# Patient Record
Sex: Female | Born: 1945 | Race: White | Hispanic: No | Marital: Single | Smoking: Former smoker
Health system: Southern US, Community
[De-identification: ages and names within clinical notes are randomized; demographics above are authoritative.]

## PROBLEM LIST (undated history)

## (undated) DIAGNOSIS — I2699 Other pulmonary embolism without acute cor pulmonale: Secondary | ICD-10-CM

## (undated) DIAGNOSIS — F32A Depression, unspecified: Secondary | ICD-10-CM

## (undated) DIAGNOSIS — M1990 Unspecified osteoarthritis, unspecified site: Secondary | ICD-10-CM

## (undated) DIAGNOSIS — F329 Major depressive disorder, single episode, unspecified: Secondary | ICD-10-CM

## (undated) DIAGNOSIS — I10 Essential (primary) hypertension: Secondary | ICD-10-CM

## (undated) HISTORY — PX: CATARACT EXTRACTION: SUR2

## (undated) HISTORY — PX: COLONOSCOPY: SHX174

## (undated) HISTORY — PX: POLYPECTOMY: SHX149

---

## 1981-07-18 HISTORY — PX: BACK SURGERY: SHX140

## 1998-12-17 ENCOUNTER — Ambulatory Visit (HOSPITAL_COMMUNITY): Admission: RE | Admit: 1998-12-17 | Discharge: 1998-12-17 | Payer: Self-pay | Admitting: Family Medicine

## 1998-12-17 ENCOUNTER — Encounter: Payer: Self-pay | Admitting: Family Medicine

## 2001-10-11 ENCOUNTER — Other Ambulatory Visit: Admission: RE | Admit: 2001-10-11 | Discharge: 2001-10-11 | Payer: Self-pay | Admitting: Obstetrics and Gynecology

## 2003-01-27 ENCOUNTER — Other Ambulatory Visit: Admission: RE | Admit: 2003-01-27 | Discharge: 2003-01-27 | Payer: Self-pay | Admitting: Obstetrics and Gynecology

## 2004-02-09 ENCOUNTER — Other Ambulatory Visit: Admission: RE | Admit: 2004-02-09 | Discharge: 2004-02-09 | Payer: Self-pay | Admitting: Obstetrics and Gynecology

## 2005-03-28 ENCOUNTER — Other Ambulatory Visit: Admission: RE | Admit: 2005-03-28 | Discharge: 2005-03-28 | Payer: Self-pay | Admitting: Obstetrics and Gynecology

## 2006-05-22 ENCOUNTER — Ambulatory Visit: Payer: Self-pay | Admitting: Internal Medicine

## 2006-06-12 ENCOUNTER — Ambulatory Visit: Payer: Self-pay | Admitting: Internal Medicine

## 2011-05-09 ENCOUNTER — Other Ambulatory Visit: Payer: Self-pay | Admitting: Obstetrics and Gynecology

## 2011-05-26 ENCOUNTER — Encounter: Payer: Self-pay | Admitting: Internal Medicine

## 2012-04-16 ENCOUNTER — Encounter: Payer: Self-pay | Admitting: Internal Medicine

## 2012-05-16 ENCOUNTER — Other Ambulatory Visit: Payer: Self-pay | Admitting: Obstetrics and Gynecology

## 2013-01-22 DIAGNOSIS — H40019 Open angle with borderline findings, low risk, unspecified eye: Secondary | ICD-10-CM | POA: Diagnosis not present

## 2013-01-22 DIAGNOSIS — H35379 Puckering of macula, unspecified eye: Secondary | ICD-10-CM | POA: Diagnosis not present

## 2013-03-20 DIAGNOSIS — M1710 Unilateral primary osteoarthritis, unspecified knee: Secondary | ICD-10-CM | POA: Diagnosis not present

## 2013-03-27 DIAGNOSIS — M1710 Unilateral primary osteoarthritis, unspecified knee: Secondary | ICD-10-CM | POA: Diagnosis not present

## 2013-04-10 DIAGNOSIS — M1710 Unilateral primary osteoarthritis, unspecified knee: Secondary | ICD-10-CM | POA: Diagnosis not present

## 2013-04-16 DIAGNOSIS — Z23 Encounter for immunization: Secondary | ICD-10-CM | POA: Diagnosis not present

## 2013-04-16 DIAGNOSIS — E785 Hyperlipidemia, unspecified: Secondary | ICD-10-CM | POA: Diagnosis not present

## 2013-04-16 DIAGNOSIS — F329 Major depressive disorder, single episode, unspecified: Secondary | ICD-10-CM | POA: Diagnosis not present

## 2013-04-16 DIAGNOSIS — I10 Essential (primary) hypertension: Secondary | ICD-10-CM | POA: Diagnosis not present

## 2013-04-16 DIAGNOSIS — E559 Vitamin D deficiency, unspecified: Secondary | ICD-10-CM | POA: Diagnosis not present

## 2013-04-17 DIAGNOSIS — M1710 Unilateral primary osteoarthritis, unspecified knee: Secondary | ICD-10-CM | POA: Diagnosis not present

## 2013-04-24 DIAGNOSIS — M1710 Unilateral primary osteoarthritis, unspecified knee: Secondary | ICD-10-CM | POA: Diagnosis not present

## 2013-05-29 DIAGNOSIS — M25569 Pain in unspecified knee: Secondary | ICD-10-CM | POA: Diagnosis not present

## 2013-05-29 DIAGNOSIS — M545 Low back pain: Secondary | ICD-10-CM | POA: Diagnosis not present

## 2013-06-26 DIAGNOSIS — M25569 Pain in unspecified knee: Secondary | ICD-10-CM | POA: Diagnosis not present

## 2013-08-06 ENCOUNTER — Other Ambulatory Visit: Payer: Self-pay | Admitting: Obstetrics and Gynecology

## 2013-08-06 DIAGNOSIS — Z1231 Encounter for screening mammogram for malignant neoplasm of breast: Secondary | ICD-10-CM | POA: Diagnosis not present

## 2013-08-06 DIAGNOSIS — Z01419 Encounter for gynecological examination (general) (routine) without abnormal findings: Secondary | ICD-10-CM | POA: Diagnosis not present

## 2013-08-06 DIAGNOSIS — Z124 Encounter for screening for malignant neoplasm of cervix: Secondary | ICD-10-CM | POA: Diagnosis not present

## 2013-10-07 DIAGNOSIS — Z0000 Encounter for general adult medical examination without abnormal findings: Secondary | ICD-10-CM | POA: Diagnosis not present

## 2013-10-07 DIAGNOSIS — N183 Chronic kidney disease, stage 3 unspecified: Secondary | ICD-10-CM | POA: Diagnosis not present

## 2013-10-07 DIAGNOSIS — E559 Vitamin D deficiency, unspecified: Secondary | ICD-10-CM | POA: Diagnosis not present

## 2013-10-07 DIAGNOSIS — I129 Hypertensive chronic kidney disease with stage 1 through stage 4 chronic kidney disease, or unspecified chronic kidney disease: Secondary | ICD-10-CM | POA: Diagnosis not present

## 2013-10-07 DIAGNOSIS — D369 Benign neoplasm, unspecified site: Secondary | ICD-10-CM | POA: Diagnosis not present

## 2013-10-07 DIAGNOSIS — N951 Menopausal and female climacteric states: Secondary | ICD-10-CM | POA: Diagnosis not present

## 2013-10-07 DIAGNOSIS — Z1211 Encounter for screening for malignant neoplasm of colon: Secondary | ICD-10-CM | POA: Diagnosis not present

## 2013-10-07 DIAGNOSIS — E785 Hyperlipidemia, unspecified: Secondary | ICD-10-CM | POA: Diagnosis not present

## 2013-10-28 DIAGNOSIS — M25569 Pain in unspecified knee: Secondary | ICD-10-CM | POA: Diagnosis not present

## 2013-11-14 ENCOUNTER — Other Ambulatory Visit: Payer: Self-pay | Admitting: Orthopaedic Surgery

## 2013-11-14 ENCOUNTER — Encounter (HOSPITAL_COMMUNITY): Payer: Self-pay | Admitting: Pharmacy Technician

## 2013-11-19 ENCOUNTER — Encounter (HOSPITAL_COMMUNITY)
Admission: RE | Admit: 2013-11-19 | Discharge: 2013-11-19 | Disposition: A | Discharge status: Home / Self Care | Payer: Medicare Other | Source: Ambulatory Visit | Attending: Orthopaedic Surgery | Admitting: Orthopaedic Surgery

## 2013-11-19 ENCOUNTER — Encounter (HOSPITAL_COMMUNITY): Payer: Self-pay

## 2013-11-19 ENCOUNTER — Other Ambulatory Visit (HOSPITAL_COMMUNITY): Payer: Self-pay | Admitting: *Deleted

## 2013-11-19 DIAGNOSIS — Z01810 Encounter for preprocedural cardiovascular examination: Secondary | ICD-10-CM | POA: Diagnosis not present

## 2013-11-19 DIAGNOSIS — Z01812 Encounter for preprocedural laboratory examination: Secondary | ICD-10-CM | POA: Diagnosis not present

## 2013-11-19 DIAGNOSIS — Z01818 Encounter for other preprocedural examination: Secondary | ICD-10-CM | POA: Insufficient documentation

## 2013-11-19 HISTORY — DX: Unspecified osteoarthritis, unspecified site: M19.90

## 2013-11-19 HISTORY — DX: Depression, unspecified: F32.A

## 2013-11-19 HISTORY — DX: Other pulmonary embolism without acute cor pulmonale: I26.99

## 2013-11-19 HISTORY — DX: Essential (primary) hypertension: I10

## 2013-11-19 HISTORY — DX: Major depressive disorder, single episode, unspecified: F32.9

## 2013-11-19 LAB — CBC WITH DIFFERENTIAL/PLATELET
Basophils Absolute: 0 10*3/uL (ref 0.0–0.1)
Basophils Relative: 0 % (ref 0–1)
EOS ABS: 0.1 10*3/uL (ref 0.0–0.7)
Eosinophils Relative: 3 % (ref 0–5)
HEMATOCRIT: 43.5 % (ref 36.0–46.0)
HEMOGLOBIN: 14.3 g/dL (ref 12.0–15.0)
LYMPHS ABS: 1.7 10*3/uL (ref 0.7–4.0)
Lymphocytes Relative: 33 % (ref 12–46)
MCH: 32 pg (ref 26.0–34.0)
MCHC: 32.9 g/dL (ref 30.0–36.0)
MCV: 97.3 fL (ref 78.0–100.0)
MONOS PCT: 9 % (ref 3–12)
Monocytes Absolute: 0.5 10*3/uL (ref 0.1–1.0)
NEUTROS ABS: 2.9 10*3/uL (ref 1.7–7.7)
Neutrophils Relative %: 55 % (ref 43–77)
Platelets: 212 10*3/uL (ref 150–400)
RBC: 4.47 MIL/uL (ref 3.87–5.11)
RDW: 13.3 % (ref 11.5–15.5)
WBC: 5.3 10*3/uL (ref 4.0–10.5)

## 2013-11-19 LAB — BASIC METABOLIC PANEL
BUN: 21 mg/dL (ref 6–23)
CHLORIDE: 103 meq/L (ref 96–112)
CO2: 26 mEq/L (ref 19–32)
Calcium: 9.3 mg/dL (ref 8.4–10.5)
Creatinine, Ser: 1 mg/dL (ref 0.50–1.10)
GFR calc Af Amer: 67 mL/min — ABNORMAL LOW (ref >90)
GFR calc non Af Amer: 58 mL/min — ABNORMAL LOW (ref >90)
GLUCOSE: 92 mg/dL (ref 70–99)
POTASSIUM: 4.5 meq/L (ref 3.7–5.3)
Sodium: 142 mEq/L (ref 137–147)

## 2013-11-19 LAB — URINE MICROSCOPIC-ADD ON

## 2013-11-19 LAB — URINALYSIS, ROUTINE W REFLEX MICROSCOPIC
Bilirubin Urine: NEGATIVE
Glucose, UA: NEGATIVE mg/dL
Hgb urine dipstick: NEGATIVE
KETONES UR: NEGATIVE mg/dL
NITRITE: NEGATIVE
Protein, ur: NEGATIVE mg/dL
Specific Gravity, Urine: 1.019 (ref 1.005–1.030)
Urobilinogen, UA: 0.2 mg/dL (ref 0.0–1.0)
pH: 7 (ref 5.0–8.0)

## 2013-11-19 LAB — PROTIME-INR
INR: 0.98 (ref 0.00–1.49)
Prothrombin Time: 12.8 seconds (ref 11.6–15.2)

## 2013-11-19 LAB — SURGICAL PCR SCREEN
MRSA, PCR: NEGATIVE
Staphylococcus aureus: NEGATIVE

## 2013-11-19 LAB — TYPE AND SCREEN
ABO/RH(D): A NEG
Antibody Screen: NEGATIVE

## 2013-11-19 LAB — APTT: aPTT: 25 seconds (ref 24–37)

## 2013-11-19 LAB — ABO/RH: ABO/RH(D): A NEG

## 2013-11-19 NOTE — Pre-Procedure Instructions (Addendum)
Gina Petty  11/19/2013   Your procedure is scheduled on:  Tuesday, Nov 26, 2013 at 10:20 AM.   Report to [facility] Entrance "A" Admitting Office at 8:20 AM.   Call this number if you have problems the morning of surgery: [phone]   Remember:   Do not eat food or drink liquids after midnight Monday, 11/25/13.   Take these medicines the morning of surgery with A SIP OF WATER: buPROPion (WELLBUTRIN XL), tylenol - if needed.  Stop Vitamins as of today. Please take all your other medicines as directed until day of surgery and follow above instructions.     Do not wear jewelry, make-up or nail polish.  Do not wear lotions, powders, or perfumes. You may wear deodorant.  Do not shave 48 hours prior to surgery.   Do not bring valuables to the hospital.  [facility] is not responsible                  for any belongings or valuables.               Contacts, dentures or bridgework may not be worn into surgery.  Leave suitcase in the car. After surgery it may be brought to your room.  For patients admitted to the hospital, discharge time is determined by your                treatment team.            Special Instructions: [facility] - Preparing for Surgery  Before surgery, you can play an important role.  Because skin is not sterile, your skin needs to be as free of germs as possible.  You can reduce the number of germs on you skin by washing with CHG (chlorahexidine gluconate) soap before surgery.  CHG is an antiseptic cleaner which kills germs and bonds with the skin to continue killing germs even after washing.  Please DO NOT use if you have an allergy to CHG or antibacterial soaps.  If your skin becomes reddened/irritated stop using the CHG and inform your nurse when you arrive at Short Stay.  Do not shave (including legs and underarms) for at least 48 hours prior to the first CHG shower.  You may shave your face.  Please follow these instructions carefully:   1.  Shower  with CHG Soap the night before surgery and the                                morning of Surgery.  2.  If you choose to wash your hair, wash your hair first as usual with your       normal shampoo.  3.  After you shampoo, rinse your hair and body thoroughly to remove the                      Shampoo.  4.  Use CHG as you would any other liquid soap.  You can apply chg directly       to the skin and wash gently with scrungie or a clean washcloth.  5.  Apply the CHG Soap to your body ONLY FROM THE NECK DOWN.        Do not use on open wounds or open sores.  Avoid contact with your eyes, ears, mouth and genitals (private parts).  Wash genitals (private parts) with your normal soap.  6.  Wash thoroughly, paying special attention to the area where your surgery        will be performed.  7.  Thoroughly rinse your body with warm water from the neck down.  8.  DO NOT shower/wash with your normal soap after using and rinsing off       the CHG Soap.  9.  Pat yourself dry with a clean towel.            10.  Wear clean pajamas.            11.  Place clean sheets on your bed the night of your first shower and do not        sleep with pets.  Day of Surgery  Do not apply any lotions the morning of surgery.  Please wear clean clothes to the hospital/surgery center.     Please read over the following fact sheets that you were given: Pain Booklet, Coughing and Deep Breathing, Blood Transfusion Information, MRSA Information and Surgical Site Infection Prevention

## 2013-11-22 NOTE — H&P (Signed)
TOTAL KNEE ADMISSION H&P  Patient is being admitted for right total knee arthroplasty.  Subjective:  Chief Complaint:right knee pain.  HPI: Gina Petty, 68 y.o. female, has a history of pain and functional disability in the right knee due to arthritis and has failed non-surgical conservative treatments for greater than 12 weeks to includeNSAID's and/or analgesics, corticosteriod injections, viscosupplementation injections, flexibility and strengthening excercises and activity modification.  Onset of symptoms was gradual, starting 6 years ago with stable course since that time. The patient noted no past surgery on the left knee(s).  Patient currently rates pain in the left knee(s) at 9 out of 10 with activity. Patient has night pain, worsening of pain with activity and weight bearing, pain that interferes with activities of daily living, pain with passive range of motion, crepitus and joint swelling.  Patient has evidence of subchondral sclerosis, periarticular osteophytes, joint subluxation and joint space narrowing by imaging studies. This patient has had none. There is no active infection.  There are no active problems to display for this patient.  Past Medical History  Diagnosis Date  . Hypertension   . PE (pulmonary embolism)     posssible 74  not able to prove  . Depression   . Arthritis     Past Surgical History  Procedure Laterality Date  . Back surgery  85    No prescriptions prior to admission   Allergies  Allergen Reactions  . Penicillins Rash    History  Substance Use Topics  . Smoking status: Former Smoker -- 1.00 packs/day for 25 years    Types: Cigarettes    Quit date: 11/19/2000  . Smokeless tobacco: Not on file  . Alcohol Use: 7.2 oz/week    8 Glasses of wine, 4 Cans of beer per week    No family history on file.   Review of Systems  Constitutional: Negative.   HENT: Negative.   Eyes: Negative.   Respiratory: Negative.   Cardiovascular: Negative.    Gastrointestinal: Negative.   Genitourinary: Negative.   Musculoskeletal: Positive for joint pain.  Skin: Negative.   Neurological: Negative.   Endo/Heme/Allergies: Negative.   Psychiatric/Behavioral: Negative.     Objective:  Physical Exam  Constitutional: She appears well-developed.  HENT:  Head: Normocephalic.  Eyes: Pupils are equal, round, and reactive to light.  Neck: Normal range of motion.  Cardiovascular: Normal rate.   Respiratory: Effort normal.  GI: Soft.  Musculoskeletal:  Right knee exam: Range of motion is 0-100.  Significant pain medial joint line with trace effusion and 1+ crepitation.  Neurological: She is alert.  Skin: Skin is warm.    Vital signs in last 24 hours:    Labs:   There is no height or weight on file to calculate BMI.   Imaging Review Plain radiographs demonstrate severe degenerative joint disease of the right knee(s). The overall alignment isneutral. The bone quality appears to be good for age and reported activity level.  Assessment/Plan:  End stage arthritis, right knee   The patient history, physical examination, clinical judgment of the provider and imaging studies are consistent with end stage degenerative joint disease of the right knee(s) and total knee arthroplasty is deemed medically necessary. The treatment options including medical management, injection therapy arthroscopy and arthroplasty were discussed at length. The risks and benefits of total knee arthroplasty were presented and reviewed. The risks due to aseptic loosening, infection, stiffness, patella tracking problems, thromboembolic complications and other imponderables were discussed. The patient acknowledged the explanation,  agreed to proceed with the plan and consent was signed. Patient is being admitted for inpatient treatment for surgery, pain control, PT, OT, prophylactic antibiotics, VTE prophylaxis, progressive ambulation and ADL's and discharge planning. The  patient is planning to be discharged home with home health services

## 2013-11-25 MED ORDER — LACTATED RINGERS IV SOLN
INTRAVENOUS | Status: DC
  Administered 2013-11-26: 50 mL/h via INTRAVENOUS

## 2013-11-25 MED ORDER — CEFAZOLIN SODIUM-DEXTROSE 2-3 GM-% IV SOLR: 2.0000 g | INTRAVENOUS | Status: DC

## 2013-11-25 MED ORDER — CHLORHEXIDINE GLUCONATE 4 % EX LIQD
60.0000 mL | Freq: Once | CUTANEOUS | Status: DC
  Filled 2013-11-25: qty 60

## 2013-11-26 ENCOUNTER — Encounter (HOSPITAL_COMMUNITY): Payer: Self-pay | Admitting: Anesthesiology

## 2013-11-26 ENCOUNTER — Encounter (HOSPITAL_COMMUNITY): Payer: Medicare Other | Admitting: Anesthesiology

## 2013-11-26 ENCOUNTER — Inpatient Hospital Stay (HOSPITAL_COMMUNITY)
Admission: RE | Admit: 2013-11-26 | Discharge: 2013-11-29 | DRG: 470 | Disposition: A | Discharge status: Skilled Nursing Facility | Payer: Medicare Other | Source: Ambulatory Visit | Attending: Orthopaedic Surgery | Admitting: Orthopaedic Surgery

## 2013-11-26 ENCOUNTER — Inpatient Hospital Stay (HOSPITAL_COMMUNITY): Payer: Medicare Other | Admitting: Anesthesiology

## 2013-11-26 ENCOUNTER — Encounter (HOSPITAL_COMMUNITY)
Admission: RE | Disposition: A | Discharge status: Skilled Nursing Facility | Payer: Self-pay | Source: Ambulatory Visit | Attending: Orthopaedic Surgery

## 2013-11-26 DIAGNOSIS — M25569 Pain in unspecified knee: Secondary | ICD-10-CM | POA: Diagnosis not present

## 2013-11-26 DIAGNOSIS — Z880 Allergy status to penicillin: Secondary | ICD-10-CM | POA: Diagnosis not present

## 2013-11-26 DIAGNOSIS — Z79899 Other long term (current) drug therapy: Secondary | ICD-10-CM

## 2013-11-26 DIAGNOSIS — F3289 Other specified depressive episodes: Secondary | ICD-10-CM | POA: Diagnosis not present

## 2013-11-26 DIAGNOSIS — M1711 Unilateral primary osteoarthritis, right knee: Secondary | ICD-10-CM | POA: Diagnosis present

## 2013-11-26 DIAGNOSIS — I10 Essential (primary) hypertension: Secondary | ICD-10-CM | POA: Diagnosis present

## 2013-11-26 DIAGNOSIS — F329 Major depressive disorder, single episode, unspecified: Secondary | ICD-10-CM | POA: Diagnosis present

## 2013-11-26 DIAGNOSIS — Z86711 Personal history of pulmonary embolism: Secondary | ICD-10-CM

## 2013-11-26 DIAGNOSIS — S8990XA Unspecified injury of unspecified lower leg, initial encounter: Secondary | ICD-10-CM | POA: Diagnosis not present

## 2013-11-26 DIAGNOSIS — M1710 Unilateral primary osteoarthritis, unspecified knee: Principal | ICD-10-CM | POA: Diagnosis present

## 2013-11-26 DIAGNOSIS — M6281 Muscle weakness (generalized): Secondary | ICD-10-CM | POA: Diagnosis not present

## 2013-11-26 DIAGNOSIS — IMO0002 Reserved for concepts with insufficient information to code with codable children: Secondary | ICD-10-CM | POA: Diagnosis not present

## 2013-11-26 DIAGNOSIS — Z7982 Long term (current) use of aspirin: Secondary | ICD-10-CM

## 2013-11-26 DIAGNOSIS — G8918 Other acute postprocedural pain: Secondary | ICD-10-CM | POA: Diagnosis not present

## 2013-11-26 DIAGNOSIS — Z96659 Presence of unspecified artificial knee joint: Secondary | ICD-10-CM | POA: Diagnosis not present

## 2013-11-26 DIAGNOSIS — E785 Hyperlipidemia, unspecified: Secondary | ICD-10-CM | POA: Diagnosis not present

## 2013-11-26 DIAGNOSIS — R269 Unspecified abnormalities of gait and mobility: Secondary | ICD-10-CM | POA: Diagnosis not present

## 2013-11-26 DIAGNOSIS — R279 Unspecified lack of coordination: Secondary | ICD-10-CM | POA: Diagnosis not present

## 2013-11-26 DIAGNOSIS — Z471 Aftercare following joint replacement surgery: Secondary | ICD-10-CM | POA: Diagnosis not present

## 2013-11-26 DIAGNOSIS — Z87891 Personal history of nicotine dependence: Secondary | ICD-10-CM | POA: Diagnosis not present

## 2013-11-26 HISTORY — PX: TOTAL KNEE ARTHROPLASTY: SHX125

## 2013-11-26 SURGERY — ARTHROPLASTY, KNEE, TOTAL
Anesthesia: General | Site: Knee | Laterality: Right

## 2013-11-26 MED ORDER — BUPROPION HCL ER (XL) 150 MG PO TB24
150.0000 mg | ORAL_TABLET | Freq: Every day | ORAL | Status: DC
  Administered 2013-11-27 – 2013-11-29 (×3): 150 mg via ORAL
  Filled 2013-11-26 (×4): qty 1

## 2013-11-26 MED ORDER — GLYCOPYRROLATE 0.2 MG/ML IJ SOLN
INTRAMUSCULAR | Status: DC | PRN
  Administered 2013-11-26: 0.4 mg via INTRAVENOUS

## 2013-11-26 MED ORDER — ONDANSETRON HCL 4 MG PO TABS: 4.0000 mg | ORAL_TABLET | Freq: Four times a day (QID) | ORAL | Status: DC | PRN

## 2013-11-26 MED ORDER — LIDOCAINE HCL (CARDIAC) 20 MG/ML IV SOLN
INTRAVENOUS | Status: DC | PRN
  Administered 2013-11-26: 50 mg via INTRAVENOUS

## 2013-11-26 MED ORDER — NEOSTIGMINE METHYLSULFATE 10 MG/10ML IV SOLN
INTRAVENOUS | Status: DC | PRN
  Administered 2013-11-26: 3 mg via INTRAVENOUS

## 2013-11-26 MED ORDER — VITAMIN D3 25 MCG (1000 UNIT) PO TABS
1000.0000 [IU] | ORAL_TABLET | Freq: Every day | ORAL | Status: DC
  Administered 2013-11-26 – 2013-11-29 (×4): 1000 [IU] via ORAL
  Filled 2013-11-26 (×4): qty 1

## 2013-11-26 MED ORDER — CEFAZOLIN SODIUM-DEXTROSE 2-3 GM-% IV SOLR
2.0000 g | Freq: Four times a day (QID) | INTRAVENOUS | Status: AC
  Administered 2013-11-26 (×2): 2 g via INTRAVENOUS
  Filled 2013-11-26 (×4): qty 50

## 2013-11-26 MED ORDER — DIPHENHYDRAMINE HCL 12.5 MG/5ML PO ELIX
12.5000 mg | ORAL_SOLUTION | ORAL | Status: DC | PRN
  Administered 2013-11-27 (×2): 25 mg via ORAL
  Filled 2013-11-26 (×2): qty 10

## 2013-11-26 MED ORDER — ONDANSETRON HCL 4 MG/2ML IJ SOLN
INTRAMUSCULAR | Status: DC | PRN
  Administered 2013-11-26: 4 mg via INTRAVENOUS

## 2013-11-26 MED ORDER — ONDANSETRON HCL 4 MG/2ML IJ SOLN: 4.0000 mg | Freq: Once | INTRAMUSCULAR | Status: DC | PRN

## 2013-11-26 MED ORDER — NEOSTIGMINE METHYLSULFATE 10 MG/10ML IV SOLN
INTRAVENOUS | Status: AC
  Filled 2013-11-26: qty 1

## 2013-11-26 MED ORDER — ASPIRIN EC 325 MG PO TBEC
325.0000 mg | DELAYED_RELEASE_TABLET | Freq: Two times a day (BID) | ORAL | Status: DC
  Administered 2013-11-27 – 2013-11-29 (×5): 325 mg via ORAL
  Filled 2013-11-26 (×7): qty 1

## 2013-11-26 MED ORDER — PROPOFOL 10 MG/ML IV BOLUS
INTRAVENOUS | Status: DC | PRN
  Administered 2013-11-26: 170 mg via INTRAVENOUS

## 2013-11-26 MED ORDER — MIDAZOLAM HCL 2 MG/2ML IJ SOLN
INTRAMUSCULAR | Status: AC
  Administered 2013-11-26: 2 mg
  Filled 2013-11-26: qty 2

## 2013-11-26 MED ORDER — HYDROMORPHONE HCL PF 1 MG/ML IJ SOLN
INTRAMUSCULAR | Status: AC
  Filled 2013-11-26: qty 1

## 2013-11-26 MED ORDER — METHOCARBAMOL 500 MG PO TABS
500.0000 mg | ORAL_TABLET | Freq: Four times a day (QID) | ORAL | Status: DC | PRN
  Administered 2013-11-26 – 2013-11-28 (×5): 500 mg via ORAL
  Filled 2013-11-26 (×6): qty 1

## 2013-11-26 MED ORDER — FENTANYL CITRATE 0.05 MG/ML IJ SOLN
INTRAMUSCULAR | Status: AC
  Filled 2013-11-26: qty 5

## 2013-11-26 MED ORDER — ALUM & MAG HYDROXIDE-SIMETH 200-200-20 MG/5ML PO SUSP: 30.0000 mL | ORAL | Status: DC | PRN

## 2013-11-26 MED ORDER — TRIAMTERENE-HCTZ 37.5-25 MG PO TABS
1.0000 | ORAL_TABLET | Freq: Every day | ORAL | Status: DC
  Administered 2013-11-26 – 2013-11-29 (×4): 1 via ORAL
  Filled 2013-11-26 (×4): qty 1

## 2013-11-26 MED ORDER — SIMVASTATIN 20 MG PO TABS
20.0000 mg | ORAL_TABLET | Freq: Every day | ORAL | Status: DC
  Administered 2013-11-27 – 2013-11-28 (×2): 20 mg via ORAL
  Filled 2013-11-26 (×3): qty 1

## 2013-11-26 MED ORDER — CEFAZOLIN SODIUM-DEXTROSE 2-3 GM-% IV SOLR
INTRAVENOUS | Status: AC
  Administered 2013-11-26: 2 g via INTRAVENOUS
  Filled 2013-11-26: qty 50

## 2013-11-26 MED ORDER — OXYCODONE HCL 5 MG PO TABS
ORAL_TABLET | ORAL | Status: AC
  Filled 2013-11-26: qty 1

## 2013-11-26 MED ORDER — FENTANYL CITRATE 0.05 MG/ML IJ SOLN
INTRAMUSCULAR | Status: DC | PRN
  Administered 2013-11-26: 150 ug via INTRAVENOUS
  Administered 2013-11-26: 100 ug via INTRAVENOUS

## 2013-11-26 MED ORDER — ACETAMINOPHEN 325 MG PO TABS: 650.0000 mg | ORAL_TABLET | Freq: Four times a day (QID) | ORAL | Status: DC | PRN

## 2013-11-26 MED ORDER — ONE-DAILY MULTI VITAMINS PO TABS
1.0000 | ORAL_TABLET | Freq: Every day | ORAL | Status: DC
  Administered 2013-11-26 – 2013-11-29 (×4): 1 via ORAL
  Filled 2013-11-26 (×4): qty 1

## 2013-11-26 MED ORDER — PHENYLEPHRINE HCL 10 MG/ML IJ SOLN
INTRAMUSCULAR | Status: DC | PRN
  Administered 2013-11-26 (×2): 80 ug via INTRAVENOUS

## 2013-11-26 MED ORDER — LACTATED RINGERS IV SOLN
INTRAVENOUS | Status: DC | PRN
  Administered 2013-11-26 (×2): via INTRAVENOUS

## 2013-11-26 MED ORDER — HYDROMORPHONE HCL PF 1 MG/ML IJ SOLN
0.5000 mg | INTRAMUSCULAR | Status: DC | PRN
  Administered 2013-11-26 – 2013-11-28 (×6): 1 mg via INTRAVENOUS
  Filled 2013-11-26 (×6): qty 1

## 2013-11-26 MED ORDER — FENTANYL CITRATE 0.05 MG/ML IJ SOLN
INTRAMUSCULAR | Status: AC
  Administered 2013-11-26: 100 ug
  Filled 2013-11-26: qty 2

## 2013-11-26 MED ORDER — SODIUM CHLORIDE 0.9 % IR SOLN
Status: DC | PRN
  Administered 2013-11-26: 3000 mL

## 2013-11-26 MED ORDER — OXYCODONE HCL 5 MG PO TABS
5.0000 mg | ORAL_TABLET | Freq: Once | ORAL | Status: AC | PRN
  Administered 2013-11-26: 5 mg via ORAL

## 2013-11-26 MED ORDER — ROCURONIUM BROMIDE 50 MG/5ML IV SOLN
INTRAVENOUS | Status: AC
  Filled 2013-11-26: qty 1

## 2013-11-26 MED ORDER — METHOCARBAMOL 1000 MG/10ML IJ SOLN
500.0000 mg | Freq: Four times a day (QID) | INTRAVENOUS | Status: DC | PRN
  Administered 2013-11-26: 500 mg via INTRAVENOUS
  Filled 2013-11-26: qty 5

## 2013-11-26 MED ORDER — GLYCOPYRROLATE 0.2 MG/ML IJ SOLN
INTRAMUSCULAR | Status: AC
  Filled 2013-11-26: qty 2

## 2013-11-26 MED ORDER — PHENOL 1.4 % MT LIQD: 1.0000 | OROMUCOSAL | Status: DC | PRN

## 2013-11-26 MED ORDER — TRANEXAMIC ACID 100 MG/ML IV SOLN
1000.0000 mg | INTRAVENOUS | Status: AC
  Administered 2013-11-26: 1000 mg via INTRAVENOUS
  Filled 2013-11-26: qty 10

## 2013-11-26 MED ORDER — ONDANSETRON HCL 4 MG/2ML IJ SOLN
INTRAMUSCULAR | Status: AC
  Filled 2013-11-26: qty 2

## 2013-11-26 MED ORDER — OXYCODONE HCL 5 MG/5ML PO SOLN: 5.0000 mg | Freq: Once | ORAL | Status: AC | PRN

## 2013-11-26 MED ORDER — LACTATED RINGERS IV SOLN
INTRAVENOUS | Status: DC
  Administered 2013-11-27: 02:00:00 via INTRAVENOUS

## 2013-11-26 MED ORDER — HYDROCODONE-ACETAMINOPHEN 10-325 MG PO TABS
1.0000 | ORAL_TABLET | ORAL | Status: DC | PRN
  Administered 2013-11-26 – 2013-11-29 (×12): 2 via ORAL
  Filled 2013-11-26 (×12): qty 2

## 2013-11-26 MED ORDER — DEXAMETHASONE SODIUM PHOSPHATE 4 MG/ML IJ SOLN
INTRAMUSCULAR | Status: DC | PRN
  Administered 2013-11-26: 4 mg via INTRAVENOUS

## 2013-11-26 MED ORDER — ROCURONIUM BROMIDE 100 MG/10ML IV SOLN
INTRAVENOUS | Status: DC | PRN
Start: 2013-11-26 — End: 2013-11-26
  Administered 2013-11-26: 40 mg via INTRAVENOUS

## 2013-11-26 MED ORDER — METOCLOPRAMIDE HCL 10 MG PO TABS
5.0000 mg | ORAL_TABLET | Freq: Three times a day (TID) | ORAL | Status: DC | PRN
  Administered 2013-11-28: 10 mg via ORAL
  Filled 2013-11-26: qty 1

## 2013-11-26 MED ORDER — HYDROMORPHONE HCL PF 1 MG/ML IJ SOLN
0.2500 mg | INTRAMUSCULAR | Status: DC | PRN
  Administered 2013-11-26 (×4): 0.5 mg via INTRAVENOUS

## 2013-11-26 MED ORDER — PROPOFOL 10 MG/ML IV BOLUS
INTRAVENOUS | Status: AC
  Filled 2013-11-26: qty 20

## 2013-11-26 MED ORDER — ARTIFICIAL TEARS OP OINT
TOPICAL_OINTMENT | OPHTHALMIC | Status: AC
  Filled 2013-11-26: qty 3.5

## 2013-11-26 MED ORDER — METOCLOPRAMIDE HCL 5 MG/ML IJ SOLN: 5.0000 mg | Freq: Three times a day (TID) | INTRAMUSCULAR | Status: DC | PRN

## 2013-11-26 MED ORDER — ACETAMINOPHEN 650 MG RE SUPP: 650.0000 mg | Freq: Four times a day (QID) | RECTAL | Status: DC | PRN

## 2013-11-26 MED ORDER — MENTHOL 3 MG MT LOZG
1.0000 | LOZENGE | OROMUCOSAL | Status: DC | PRN
Start: 2013-11-26 — End: 2013-11-29

## 2013-11-26 MED ORDER — ARTIFICIAL TEARS OP OINT
TOPICAL_OINTMENT | OPHTHALMIC | Status: DC | PRN
  Administered 2013-11-26: 1 via OPHTHALMIC

## 2013-11-26 MED ORDER — ONDANSETRON HCL 4 MG/2ML IJ SOLN: 4.0000 mg | Freq: Four times a day (QID) | INTRAMUSCULAR | Status: DC | PRN

## 2013-11-26 MED ORDER — FERROUS SULFATE 325 (65 FE) MG PO TABS
325.0000 mg | ORAL_TABLET | Freq: Two times a day (BID) | ORAL | Status: DC
  Administered 2013-11-27 – 2013-11-29 (×5): 325 mg via ORAL
  Filled 2013-11-26 (×7): qty 1

## 2013-11-26 MED ORDER — BISACODYL 5 MG PO TBEC
5.0000 mg | DELAYED_RELEASE_TABLET | Freq: Every day | ORAL | Status: DC | PRN
  Administered 2013-11-28: 5 mg via ORAL
  Filled 2013-11-26: qty 1

## 2013-11-26 MED ORDER — DEXAMETHASONE SODIUM PHOSPHATE 4 MG/ML IJ SOLN
INTRAMUSCULAR | Status: AC
  Filled 2013-11-26: qty 2

## 2013-11-26 MED ORDER — DOCUSATE SODIUM 100 MG PO CAPS
100.0000 mg | ORAL_CAPSULE | Freq: Two times a day (BID) | ORAL | Status: DC
  Administered 2013-11-26 – 2013-11-29 (×6): 100 mg via ORAL
  Filled 2013-11-26 (×6): qty 1

## 2013-11-26 SURGICAL SUPPLY — 76 items
APL SKNCLS STERI-STRIP NONHPOA (GAUZE/BANDAGES/DRESSINGS) ×1
BANDAGE ELASTIC 4 VELCRO ST LF (GAUZE/BANDAGES/DRESSINGS) ×3 IMPLANT
BANDAGE ELASTIC 6 VELCRO ST LF (GAUZE/BANDAGES/DRESSINGS) ×2 IMPLANT
BANDAGE ESMARK 6X9 LF (GAUZE/BANDAGES/DRESSINGS) ×1 IMPLANT
BANDAGE GAUZE ELAST BULKY 4 IN (GAUZE/BANDAGES/DRESSINGS) ×4 IMPLANT
BENZOIN TINCTURE PRP APPL 2/3 (GAUZE/BANDAGES/DRESSINGS) ×2 IMPLANT
BLADE 10 SAFETY STRL DISP (BLADE) ×1 IMPLANT
BLADE SAGITTAL 25.0X1.19X90 (BLADE) ×2 IMPLANT
BLADE SAGITTAL 25.0X1.19X90MM (BLADE) ×1
BLADE SURG ROTATE 9660 (MISCELLANEOUS) IMPLANT
BNDG CMPR 9X6 STRL LF SNTH (GAUZE/BANDAGES/DRESSINGS) ×1
BNDG CMPR MED 10X6 ELC LF (GAUZE/BANDAGES/DRESSINGS) ×1
BNDG ELASTIC 6X10 VLCR STRL LF (GAUZE/BANDAGES/DRESSINGS) ×3 IMPLANT
BNDG ESMARK 6X9 LF (GAUZE/BANDAGES/DRESSINGS) ×3
BNDG GAUZE ELAST 4 BULKY (GAUZE/BANDAGES/DRESSINGS) ×2 IMPLANT
BOWL SMART MIX CTS (DISPOSABLE) ×3 IMPLANT
CAP UPCHARGE REVISION TRAY ×2 IMPLANT
CAPT RP KNEE ×2 IMPLANT
CEMENT HV SMART SET (Cement) ×6 IMPLANT
CLOSURE WOUND 1/2 X4 (GAUZE/BANDAGES/DRESSINGS) ×1
COVER SURGICAL LIGHT HANDLE (MISCELLANEOUS) ×3 IMPLANT
CUFF TOURNIQUET SINGLE 34IN LL (TOURNIQUET CUFF) ×3 IMPLANT
CUFF TOURNIQUET SINGLE 44IN (TOURNIQUET CUFF) IMPLANT
DRAPE EXTREMITY T 121X128X90 (DRAPE) ×3 IMPLANT
DRAPE PROXIMA HALF (DRAPES) ×3 IMPLANT
DRAPE U-SHAPE 47X51 STRL (DRAPES) ×3 IMPLANT
DRSG ADAPTIC 3X8 NADH LF (GAUZE/BANDAGES/DRESSINGS) ×3 IMPLANT
DRSG PAD ABDOMINAL 8X10 ST (GAUZE/BANDAGES/DRESSINGS) ×3 IMPLANT
DURAPREP 26ML APPLICATOR (WOUND CARE) ×3 IMPLANT
ELECT REM PT RETURN 9FT ADLT (ELECTROSURGICAL) ×3
ELECTRODE REM PT RTRN 9FT ADLT (ELECTROSURGICAL) ×1 IMPLANT
FACESHIELD WRAPAROUND (MASK) ×3 IMPLANT
FACESHIELD WRAPAROUND OR TEAM (MASK) ×2 IMPLANT
GLOVE BIO SURGEON STRL SZ8.5 (GLOVE) IMPLANT
GLOVE BIOGEL PI IND STRL 8 (GLOVE) ×2 IMPLANT
GLOVE BIOGEL PI IND STRL 8.5 (GLOVE) IMPLANT
GLOVE BIOGEL PI INDICATOR 8 (GLOVE) ×4
GLOVE BIOGEL PI INDICATOR 8.5 (GLOVE)
GLOVE SS BIOGEL STRL SZ 8 (GLOVE) ×2 IMPLANT
GLOVE SUPERSENSE BIOGEL SZ 8 (GLOVE) ×4
GOWN STRL REUS W/ TWL LRG LVL3 (GOWN DISPOSABLE) ×1 IMPLANT
GOWN STRL REUS W/ TWL XL LVL3 (GOWN DISPOSABLE) ×1 IMPLANT
GOWN STRL REUS W/TWL 2XL LVL3 (GOWN DISPOSABLE) ×3 IMPLANT
GOWN STRL REUS W/TWL LRG LVL3 (GOWN DISPOSABLE) ×3
GOWN STRL REUS W/TWL XL LVL3 (GOWN DISPOSABLE) ×3
HANDPIECE INTERPULSE COAX TIP (DISPOSABLE) ×3
HOOD PEEL AWAY FACE SHEILD DIS (HOOD) ×5 IMPLANT
IMMOBILIZER KNEE 20 (SOFTGOODS) ×3 IMPLANT
IMMOBILIZER KNEE 20 THIGH 36 (SOFTGOODS) IMPLANT
IMMOBILIZER KNEE 22 UNIV (SOFTGOODS) ×3 IMPLANT
IMMOBILIZER KNEE 24 THIGH 36 (MISCELLANEOUS) IMPLANT
IMMOBILIZER KNEE 24 UNIV (MISCELLANEOUS)
KIT BASIN OR (CUSTOM PROCEDURE TRAY) ×3 IMPLANT
KIT ROOM TURNOVER OR (KITS) ×3 IMPLANT
MANIFOLD NEPTUNE II (INSTRUMENTS) ×3 IMPLANT
NDL HYPO 21X1 ECLIPSE (NEEDLE) ×1 IMPLANT
NEEDLE HYPO 21X1 ECLIPSE (NEEDLE) ×3 IMPLANT
NS IRRIG 1000ML POUR BTL (IV SOLUTION) ×3 IMPLANT
PACK TOTAL JOINT (CUSTOM PROCEDURE TRAY) ×3 IMPLANT
PAD ARMBOARD 7.5X6 YLW CONV (MISCELLANEOUS) ×6 IMPLANT
SET HNDPC FAN SPRY TIP SCT (DISPOSABLE) ×1 IMPLANT
SPONGE GAUZE 4X4 12PLY (GAUZE/BANDAGES/DRESSINGS) ×3 IMPLANT
STAPLER VISISTAT 35W (STAPLE) IMPLANT
STRIP CLOSURE SKIN 1/2X4 (GAUZE/BANDAGES/DRESSINGS) ×1 IMPLANT
SUCTION FRAZIER TIP 10 FR DISP (SUCTIONS) IMPLANT
SUT MNCRL AB 3-0 PS2 18 (SUTURE) IMPLANT
SUT VIC AB 0 CT1 27 (SUTURE) ×6
SUT VIC AB 0 CT1 27XBRD ANBCTR (SUTURE) ×2 IMPLANT
SUT VIC AB 2-0 CT1 27 (SUTURE) ×6
SUT VIC AB 2-0 CT1 TAPERPNT 27 (SUTURE) ×2 IMPLANT
SUT VLOC 180 0 24IN GS25 (SUTURE) ×3 IMPLANT
SYR 50ML LL SCALE MARK (SYRINGE) ×3 IMPLANT
TOWEL OR 17X24 6PK STRL BLUE (TOWEL DISPOSABLE) ×3 IMPLANT
TOWEL OR 17X26 10 PK STRL BLUE (TOWEL DISPOSABLE) ×3 IMPLANT
TRAY FOLEY CATH 14FR (SET/KITS/TRAYS/PACK) ×1 IMPLANT
WATER STERILE IRR 1000ML POUR (IV SOLUTION) ×6 IMPLANT

## 2013-11-26 NOTE — Op Note (Signed)
PREOP DIAGNOSIS: DJD RIGHT KNEE POSTOP DIAGNOSIS: same PROCEDURE: RIGHT TKR ANESTHESIA: General and block ATTENDING SURGEON: [name] ASSISTANT: Loni Dolly PA  INDICATIONS FOR PROCEDURE: Gina Petty is a 68 y.o. female who has struggled for a long time with pain due to degenerative arthritis of the right knee.  The patient has failed many conservative non-operative measures and at this point has pain which limits the ability to sleep and walk.  The patient is offered total knee replacement.  Informed operative consent was obtained after discussion of possible risks of anesthesia, infection, neurovascular injury, DVT, and death.  The importance of the post-operative rehabilitation protocol to optimize result was stressed extensively with the patient.  SUMMARY OF FINDINGS AND PROCEDURE:  Gina Petty was taken to the operative suite where under the above anesthesia a right knee replacement was performed.  There were advanced degenerative changes and the bone quality was excellent.  We used the DePuy system and placed size standard plus femur, 4MBT tibia, 38 mm all polyethylene patella, and a size 10 mm spacer.  Loni Dolly PA-C assisted throughout and was invaluable to the completion of the case in that he helped retract and maintain exposure while I placed components.  He also helped close thereby minimizing OR time.  The patient was admitted for appropriate post-op care to include perioperative antibiotics and mechanical and pharmacologic measures for DVT prophylaxis.  DESCRIPTION OF PROCEDURE:  Gina Petty was taken to the operative suite where the above anesthesia was applied.  The patient was positioned supine and prepped and draped in normal sterile fashion.  An appropriate time out was performed.  After the administration of Kefzol pre-op antibiotic the leg was elevated and exsanguinated and a tourniquet inflated. A standard longitudinal incision was made on the anterior knee.  Dissection  was carried down to the extensor mechanism.  All appropriate anti-infective measures were used including the pre-operative antibiotic, betadine impregnated drape, and closed hooded exhaust systems for each member of the surgical team.  A medial parapatellar incision was made in the extensor mechanism and the knee cap flipped and the knee flexed.  Some residual meniscal tissues were removed along with any remaining ACL/PCL tissue.  A guide was placed on the tibia and a flat cut was made on it's superior surface.  An intramedullary guide was placed in the femur and was utilized to make anterior and posterior cuts creating an appropriate flexion gap.  A second intramedullary guide was placed in the femur to make a distal cut properly balancing the knee with an extension gap equal to the flexion gap.  The three bones sized to the above mentioned sizes and the appropriate guides were placed and utilized.  A trial reduction was done and the knee easily came to full extension and the patella tracked well on flexion.  The trial components were removed and all bones were cleaned with pulsatile lavage and then dried thoroughly.  Cement was mixed and was pressurized onto the bones followed by placement of the aforementioned components.  Excess cement was trimmed and pressure was held on the components until the cement had hardened.  The tourniquet was deflated and a small amount of bleeding was controlled with cautery and pressure.  The knee was irrigated thoroughly.  The extensor mechanism was re-approximated with V-loc suture in running fashion.  The knee was flexed and the repair was solid.  The subcutaneous tissues were re-approximated with #0 and #2-0 vicryl and the skin closed with  a subcuticular stitch and steristrips.  A sterile dressing was applied.  Intraoperative fluids, EBL, and tourniquet time can be obtained from anesthesia records.  DISPOSITION:  The patient was taken to recovery room in stable condition and  admitted for appropriate post-op care to include peri-operative antibiotic and DVT prophylaxis with mechanical and pharmacologic measures.  [name] 11/26/2013, 12:24 PM

## 2013-11-26 NOTE — Interval H&P Note (Signed)
History and Physical Interval Note:  11/26/2013 9:11 AM  Gina Petty  has presented today for surgery, with the diagnosis of Right knee degenerative joint disease  The various methods of treatment have been discussed with the patient and family. After consideration of risks, benefits and other options for treatment, the patient has consented to  Procedure(s) with comments: TOTAL KNEE ARTHROPLASTY (Right) - Right total knee arthroplasty as a surgical intervention .  The patient's history has been reviewed, patient examined, no change in status, stable for surgery.  I have reviewed the patient's chart and labs.  Questions were answered to the patient's satisfaction.     [name]

## 2013-11-26 NOTE — Transfer of Care (Signed)
Immediate Anesthesia Transfer of Care Note  Patient: Gina Petty  Procedure(s) Performed: Procedure(s) with comments: TOTAL KNEE ARTHROPLASTY (Right) - Right total knee arthroplasty  Patient Location: PACU  Anesthesia Type:GA combined with regional for post-op pain  Level of Consciousness: awake, alert  and oriented  Airway & Oxygen Therapy: Patient Spontanous Breathing and Patient connected to nasal cannula oxygen  Post-op Assessment: Report given to PACU RN  Post vital signs: Reviewed and stable  Complications: No apparent anesthesia complications

## 2013-11-26 NOTE — Progress Notes (Signed)
Report given to Leeya Rusconi rn as cargiver 

## 2013-11-26 NOTE — Anesthesia Procedure Notes (Signed)
Anesthesia Regional Block:  Adductor canal block  Pre-Anesthetic Checklist: ,, timeout performed, Correct Patient, Correct Site, Correct Laterality, Correct Procedure, Correct Position, site marked, Risks and benefits discussed,  Surgical consent,  Pre-op evaluation,  At surgeon's request and post-op pain management  Laterality: Right  Prep: chloraprep       Needles:   Needle Type: Echogenic Stimulator Needle     Needle Length: 9cm 9 cm Needle Gauge: 22 and 22 G    Additional Needles:  Procedures: ultrasound guided (picture in chart) Adductor canal block Narrative:  Start time: 11/26/2013 9:20 AM End time: 11/26/2013 9:25 AM Injection made incrementally with aspirations every 5 mL.  Performed by: Personally   Additional Notes: 30 cc 0.5% marcaine with 1:200 Epi

## 2013-11-26 NOTE — Anesthesia Preprocedure Evaluation (Signed)
Anesthesia Evaluation  Patient identified by MRN, date of birth, ID band Patient awake    Reviewed: Allergy & Precautions, H&P , NPO status , Patient's Chart, lab work & pertinent test results  Airway Mallampati: II TM Distance: >3 FB Neck ROM: Full    Dental  (+) Teeth Intact, Dental Advisory Given   Pulmonary former smoker,  breath sounds clear to auscultation        Cardiovascular hypertension, Rhythm:Regular     Neuro/Psych    GI/Hepatic   Endo/Other    Renal/GU      Musculoskeletal   Abdominal (+) + obese,   Peds  Hematology   Anesthesia Other Findings   Reproductive/Obstetrics                           Anesthesia Physical Anesthesia Plan  ASA: II  Anesthesia Plan: General   Post-op Pain Management:    Induction: Intravenous  Airway Management Planned: Oral ETT  Additional Equipment:   Intra-op Plan:   Post-operative Plan: Extubation in OR  Informed Consent: I have reviewed the patients History and Physical, chart, labs and discussed the procedure including the risks, benefits and alternatives for the proposed anesthesia with the patient or authorized representative who has indicated his/her understanding and acceptance.   Dental advisory given  Plan Discussed with: CRNA and Anesthesiologist  Anesthesia Plan Comments: (DJD R. Knee Hypertension  Plan GA with adductor canal block and LMA  Roberts Gaudy, MD)        Anesthesia Quick Evaluation

## 2013-11-26 NOTE — Progress Notes (Signed)
orth tech called states no more cpm available will put pt on list

## 2013-11-26 NOTE — Anesthesia Postprocedure Evaluation (Signed)
  Anesthesia Post-op Note  Patient: Gina Petty  Procedure(s) Performed: Procedure(s) with comments: TOTAL KNEE ARTHROPLASTY (Right) - Right total knee arthroplasty  Patient Location: PACU  Anesthesia Type:General and GA combined with regional for post-op pain  Level of Consciousness: awake, alert  and oriented  Airway and Oxygen Therapy: Patient Spontanous Breathing and Patient connected to nasal cannula oxygen  Post-op Pain: mild  Post-op Assessment: Post-op Vital signs reviewed, Patient's Cardiovascular Status Stable, Respiratory Function Stable, Patent Airway and Pain level controlled  Post-op Vital Signs: stable  Last Vitals:  Filed Vitals:   11/26/13 1400  BP:   Pulse: 73  Temp: 36.7 C  Resp: 14    Complications: No apparent anesthesia complications

## 2013-11-26 NOTE — Progress Notes (Signed)
Orthopedic Tech Progress Note Patient Details:  Gina Petty [birth year] [id number] Applied overhead frame to bed CPM Right Knee CPM Right Knee: On Right Knee Flexion (Degrees): 60 Right Knee Extension (Degrees): 0   Braulio Bosch 11/26/2013, 5:27 PM

## 2013-11-27 ENCOUNTER — Encounter (HOSPITAL_COMMUNITY): Payer: Self-pay | Admitting: General Practice

## 2013-11-27 LAB — CBC
HCT: 39 % (ref 36.0–46.0)
HEMOGLOBIN: 12.9 g/dL (ref 12.0–15.0)
MCH: 32.1 pg (ref 26.0–34.0)
MCHC: 33.1 g/dL (ref 30.0–36.0)
MCV: 97 fL (ref 78.0–100.0)
PLATELETS: 213 10*3/uL (ref 150–400)
RBC: 4.02 MIL/uL (ref 3.87–5.11)
RDW: 13.3 % (ref 11.5–15.5)
WBC: 9 10*3/uL (ref 4.0–10.5)

## 2013-11-27 LAB — BASIC METABOLIC PANEL
BUN: 20 mg/dL (ref 6–23)
CO2: 25 meq/L (ref 19–32)
Calcium: 9.1 mg/dL (ref 8.4–10.5)
Chloride: 99 mEq/L (ref 96–112)
Creatinine, Ser: 1.03 mg/dL (ref 0.50–1.10)
GFR calc Af Amer: 65 mL/min — ABNORMAL LOW (ref >90)
GFR calc non Af Amer: 56 mL/min — ABNORMAL LOW (ref >90)
GLUCOSE: 144 mg/dL — AB (ref 70–99)
POTASSIUM: 4 meq/L (ref 3.7–5.3)
SODIUM: 138 meq/L (ref 137–147)

## 2013-11-27 NOTE — Care Management Note (Signed)
CARE MANAGEMENT NOTE 11/27/2013  Patient:  Gina Petty, Gina Petty   Account Number:  [id number]  Date Initiated:  11/27/2013  Documentation initiated by:  Ricki Miller  Subjective/Objective Assessment:   68 yr old female s/p right total knee  arthroplasty.     Action/Plan:   Patient is for shorttterm rehab at SNF, social worker is aware. Patient will  be going to [facility].   Anticipated DC Date:  11/29/2013   Anticipated DC Plan:  SKILLED NURSING FACILITY  In-house referral  Clinical Social Worker      DC Planning Services  CM consult      [facility] Choice  NA   Choice offered to / List presented to:          [facility] arranged  NA      Status of service:  Completed, signed off Medicare Important Message given?   (If response is "NO", the following Medicare IM given date fields will be blank) Date Medicare IM given:   Date Additional Medicare IM given:    Discharge Disposition:  [place]

## 2013-11-27 NOTE — Progress Notes (Addendum)
Subjective: 1 Day Post-Op Procedure(s) (LRB): TOTAL KNEE ARTHROPLASTY (Right)  Activity level:  wbat Diet tolerance:  Eating well Voiding:  ok Patient reports pain as 4 on 0-10 scale.    Objective: Vital signs in last 24 hours: Temp:  [97.7 F (36.5 C)-98.7 F (37.1 C)] 98.7 F (37.1 C) (05/13 0555) Pulse Rate:  [68-96] 75 (05/13 0555) Resp:  [12-20] 18 (05/13 0555) BP: (108-157)/(56-85) 131/71 mmHg (05/13 0555) SpO2:  [10 %-100 %] 98 % (05/13 0555) Weight:  [92.08 kg (203 lb)] 92.08 kg (203 lb) (05/13 0100)  Labs:  Recent Labs  11/27/13 0505  HGB 12.9    Recent Labs  11/27/13 0505  WBC 9.0  RBC 4.02  HCT 39.0  PLT 213    Recent Labs  11/27/13 0505  NA 138  K 4.0  CL 99  CO2 25  BUN 20  CREATININE 1.03  GLUCOSE 144*  CALCIUM 9.1   No results found for this basename: LABPT, INR,  in the last 72 hours  Physical Exam:  Neurologically intact ABD soft Neurovascular intact Sensation intact distally Dorsiflexion/Plantar flexion intact Incision: dressing C/D/I No cellulitis present  Assessment/Plan:  1 Day Post-Op Procedure(s) (LRB): TOTAL KNEE ARTHROPLASTY (Right) Advance diet Up with therapy D/C IV fluids Discharge to SNF on firday Continue ASA 325mg  BID x 2 weeks Follow up in office in 2 weeks Dressing change to mepilex    [place] 11/27/2013, 8:21 AM

## 2013-11-27 NOTE — Evaluation (Signed)
Physical Therapy Evaluation Patient Details Name: Gina Petty MRN: [id number] DOB: 12/26/1945 Today's Date: 11/27/2013   History of Present Illness  68 y.o. female s/p right TKA with history of HTN.  Clinical Impression  Pt is s/p right TKA resulting in the deficits listed below (see PT Problem List). Pt with good knee stability while in knee immobilizer and no evidence of buckling. Pt prefers to d/c to SNF for further rehabilitation prior to going home, due to living alone. Pt will benefit from skilled PT to increase their independence and safety with mobility to allow discharge to the venue listed below.      Follow Up Recommendations SNF    Equipment Recommendations  Rolling walker with 5" wheels    Recommendations for Other Services       Precautions / Restrictions Precautions Precautions: Knee Precaution Comments: Reviewed precautions verbally Required Braces or Orthoses: Knee Immobilizer - Right Restrictions Weight Bearing Restrictions: Yes RLE Weight Bearing: Weight bearing as tolerated      Mobility  Bed Mobility Overal bed mobility: Needs Assistance Bed Mobility: Supine to Sit     Supine to sit: Supervision     General bed mobility comments: supervision for safety. cues for technique. Requires extra time. Did not require assist for trunk on to bring LE off bed.  Transfers Overall transfer level: Needs assistance Equipment used: Rolling walker (2 wheeled) Transfers: Sit to/from Stand Sit to Stand: Supervision         General transfer comment: supervision for safety with sit to stand performed from lowest bed setting, BSC, and bench in hall. Verbal cues for hand placement from each surface. Good balance upon standing. cues to extend right knee in weight bearing position  Ambulation/Gait Ambulation/Gait assistance: Min guard Ambulation Distance (Feet): 30 Feet (x2) Assistive device: Rolling walker (2 wheeled) Gait Pattern/deviations: Step-through  pattern;Decreased step length - left;Decreased stance time - right;Antalgic Gait velocity: decreased   General Gait Details: Verbal cues for right knee extension in stance phase with quadriceps activation. No instances of knee buckling with knee immobilizer in place, demonstrating good control of knee stability and maneuvers RW safely.  Stairs            Wheelchair Mobility    Modified Rankin (Stroke Patients Only)       Balance Overall balance assessment: Needs assistance Sitting-balance support: No upper extremity supported;Feet supported Sitting balance-Leahy Scale: Good Sitting balance - Comments: good limits of stability in sitting   Standing balance support: No upper extremity supported;During functional activity Standing balance-Leahy Scale: Fair Standing balance comment: Able to stand briefly without RW to pull up pants from knees                             Pertinent Vitals/Pain 4/10 pain - states she will wait 1 hour for her scheduled pain medication Patient repositioned in chair for comfort.     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     Hand Dominance   Dominant Hand:  Right    Extremity/Trunk Assessment   Upper Extremity Assessment: Overall WFL for tasks assessed           Lower Extremity Assessment: RLE deficits/detail RLE Deficits / Details: decreased strength and ROM       Communication   Communication: No difficulties  Cognition Arousal/Alertness:  Awake/alert Behavior During Therapy: WFL for tasks assessed/performed Overall Cognitive Status: Within Functional Limits for tasks assessed                      General Comments General comments (skin integrity, edema, etc.): reviewed precautions and safe mobility as well as therapeutic exercises. Discussed at length options for d/c and have decided pt will be safest with d/c to SNF as she lives alone.    Exercises Total Joint Exercises Ankle Circles/Pumps: AROM;Both;10 reps;Seated Quad Sets: AROM;Right;10 reps;Supine Long Arc Quad: AAROM;Right;5 reps;Seated Knee Flexion: AROM;Right;5 reps;Seated      Assessment/Plan    PT Assessment Patient needs continued PT services  PT Diagnosis Difficulty walking;Abnormality of gait;Acute pain   PT Problem List Decreased strength;Decreased range of motion;Decreased balance;Decreased activity tolerance;Decreased mobility;Decreased knowledge of use of DME;Decreased knowledge of precautions;Pain  PT Treatment Interventions DME instruction;Stair training;Gait training;Functional mobility training;Therapeutic activities;Therapeutic exercise;Balance training;Neuromuscular re-education;Patient/family education;Modalities   PT Goals (Current goals can be found in the Care Plan section) Acute Rehab PT Goals Patient Stated Goal: get better and go back to work PT Goal Formulation: With patient Time For Goal Achievement: 11/27/13 Potential to Achieve Goals: Good    Frequency 7X/week   Barriers to discharge Decreased caregiver support Pt lives alone    Co-evaluation               End of Session Equipment Utilized During Treatment: Gait belt;Right knee immobilizer Activity Tolerance: Patient tolerated treatment well Patient left: in chair;with call bell/phone within reach Nurse Communication: Mobility status         Time: 1059-1140 PT Time Calculation (min): 41 min   Charges:   PT Evaluation $Initial PT Evaluation Tier I: 1  Procedure PT Treatments $Gait Training: 8-22 mins $Therapeutic Exercise: 8-22 mins   PT G CodesCamille Bal [place], [place] 11/27/2013, 12:17 PM

## 2013-11-27 NOTE — Progress Notes (Signed)
Utilization review completed.  

## 2013-11-27 NOTE — Progress Notes (Signed)
Orthopedic Tech Progress Note Patient Details:  Gina Petty 26-Apr-1946 [id number]  Patient ID: Gina Petty, female   DOB: 24-Apr-1946, 68 y.o.   MRN: [id number] Placed pt's rle in cpm @0 -60degrees @1500   Gina Petty 11/27/2013, 3:06 PM

## 2013-11-28 LAB — CBC
HCT: 38 % (ref 36.0–46.0)
HEMOGLOBIN: 12.6 g/dL (ref 12.0–15.0)
MCH: 32.4 pg (ref 26.0–34.0)
MCHC: 33.2 g/dL (ref 30.0–36.0)
MCV: 97.7 fL (ref 78.0–100.0)
Platelets: 186 10*3/uL (ref 150–400)
RBC: 3.89 MIL/uL (ref 3.87–5.11)
RDW: 13.6 % (ref 11.5–15.5)
WBC: 8.4 10*3/uL (ref 4.0–10.5)

## 2013-11-28 NOTE — Progress Notes (Signed)
OT Cancellation Note  Patient Details Name: Gina Petty MRN: [id number] DOB: Oct 03, 1945   Cancelled Treatment:    Reason Eval/Treat Not Completed: Other (comment) Pt is Medicare and current D/C plan is SNF. No apparent immediate acute care OT needs, therefore will defer OT to SNF. If OT eval is needed please call Acute Rehab Dept. at [phone] or text page OT at [phone].   Juluis Rainier [phone] 11/28/2013, 7:49 AM

## 2013-11-28 NOTE — Progress Notes (Signed)
Orthopedic Tech Progress Note Patient Details:  Gina Petty September 06, 1945 [id number]  Patient ID: Gina Petty, female   DOB: May 29, 1946, 68 y.o.   MRN: [id number] Placed pt's rle in cpm @ 0-70 degrees @1450   Gina Petty 11/28/2013, 2:50 PM

## 2013-11-28 NOTE — Progress Notes (Signed)
Physical Therapy Treatment Patient Details Name: Gina Petty MRN: [id number] DOB: 1946/05/06 Today's Date: 11/28/2013    History of Present Illness 68 y.o. female s/p right TKA with history of HTN.    PT Comments    Pt continues to progress well towards physical therapy goals, ambulating up to 75 feet x2 with min guard for safety. Trial of stair training with pt able to ascend/descend 2 steps with min guard using rails. Patient will continue to benefit from skilled physical therapy services to further improve independence with functional mobility. She will benefit from rehab in SNF to further improve independence prior to d/c home alone.   Follow Up Recommendations  SNF     Equipment Recommendations  Rolling walker with 5" wheels    Recommendations for Other Services       Precautions / Restrictions Precautions Precautions: Knee Precaution Comments: Reviewed precautions verbally Required Braces or Orthoses: Knee Immobilizer - Right Restrictions Weight Bearing Restrictions: Yes RLE Weight Bearing: Weight bearing as tolerated    Mobility  Bed Mobility               General bed mobility comments: Pt in recliner at start and finish of therapy session  Transfers Overall transfer level: Needs assistance Equipment used: Rolling walker (2 wheeled) Transfers: Sit to/from Stand Sit to Stand: Supervision         General transfer comment: supervision for safety with sit to stand performed x2 from recliner. Correct hand placement and good knee stability upon standing  Ambulation/Gait Ambulation/Gait assistance: Min guard Ambulation Distance (Feet): 75 Feet (x2) Assistive device: Rolling walker (2 wheeled) Gait Pattern/deviations: Step-through pattern;Decreased step length - left;Decreased stance time - right;Antalgic Gait velocity: decreased   General Gait Details: Verbal cues for right knee extension in stance phase with quadriceps activation. Continues to shows  strong knee stability without buckling while using knee immobilizer. cues for forward gaze and upright posture.   Stairs Stairs: Yes Stairs assistance: Min guard Stair Management: One rail Right;One rail Left;Step to pattern;Sideways Number of Stairs: 2 (x3) General stair comments: trial of stair navigation with rail on R and again with rail on left. demonstrated and had pt teach back sequencing and technique. Relied heavily on rail, cues for upright posture and correct use of arms for support on rail.  Wheelchair Mobility    Modified Rankin (Stroke Patients Only)       Balance                                    Cognition Arousal/Alertness: Awake/alert Behavior During Therapy: WFL for tasks assessed/performed Overall Cognitive Status: Within Functional Limits for tasks assessed                      Exercises Total Joint Exercises Ankle Circles/Pumps: AROM;Both;10 reps;Seated Quad Sets: AROM;Right;10 reps;Supine Knee Flexion: Right;5 reps;Seated;AAROM (with overpressure and hold) Goniometric ROM: 2-81 degrees AROM right knee flexion    General Comments General comments (skin integrity, edema, etc.): Reviewed therapeutic exercises and precautions.      Pertinent Vitals/Pain 4/10 pain Patient repositioned in chair for comfort.     Home Living                      Prior Function            PT Goals (current goals can now be found in the care plan  section) Acute Rehab PT Goals Patient Stated Goal: get better and go back to work PT Goal Formulation: With patient Time For Goal Achievement: 11/27/13 Potential to Achieve Goals: Good Progress towards PT goals: Progressing toward goals    Frequency  7X/week    PT Plan Current plan remains appropriate    Co-evaluation             End of Session Equipment Utilized During Treatment: Gait belt;Right knee immobilizer Activity Tolerance: Patient tolerated treatment well Patient  left: in chair;with call bell/phone within reach     Time: 1157-1222 PT Time Calculation (min): 25 min  Charges:  $Gait Training: 8-22 mins $Therapeutic Exercise: 8-22 mins                    G Codes:      [place], [place]  Ellouise Newer 11/28/2013, 1:17 PM

## 2013-11-28 NOTE — Progress Notes (Signed)
Subjective: 2 Days Post-Op Procedure(s) (LRB): TOTAL KNEE ARTHROPLASTY (Right)  Activity level:  wbat Diet tolerance:  Eating well Voiding:  ok Patient reports pain as 2 on 0-10 scale.    Objective: Vital signs in last 24 hours: Temp:  [98.6 F (37 C)-98.9 F (37.2 C)] 98.9 F (37.2 C) (05/14 0612) Pulse Rate:  [85-100] 95 (05/14 0612) Resp:  [16-18] 18 (05/14 0612) BP: (123-137)/(63-77) 124/77 mmHg (05/14 0612) SpO2:  [96 %-98 %] 98 % (05/14 0612)  Labs:  Recent Labs  11/27/13 0505 11/28/13 0500  HGB 12.9 12.6    Recent Labs  11/27/13 0505 11/28/13 0500  WBC 9.0 8.4  RBC 4.02 3.89  HCT 39.0 38.0  PLT 213 186    Recent Labs  11/27/13 0505  NA 138  K 4.0  CL 99  CO2 25  BUN 20  CREATININE 1.03  GLUCOSE 144*  CALCIUM 9.1   No results found for this basename: LABPT, INR,  in the last 72 hours  Physical Exam:  Neurologically intact ABD soft Neurovascular intact Sensation intact distally Intact pulses distally Dorsiflexion/Plantar flexion intact Incision: dressing C/D/I and scant drainage No cellulitis present  Assessment/Plan:  2 Days Post-Op Procedure(s) (LRB): TOTAL KNEE ARTHROPLASTY (Right) Advance diet Up with therapy Plan for discharge tomorrow Discharge to SNF [facility] Continue ASA 325mg  BID x 2 weeks Follow up in office in 2 weeks Reinforce dressing as needed.    Larwance Sachs Gina Petty 11/28/2013, 11:42 AM

## 2013-11-29 DIAGNOSIS — F3289 Other specified depressive episodes: Secondary | ICD-10-CM | POA: Diagnosis not present

## 2013-11-29 DIAGNOSIS — M6281 Muscle weakness (generalized): Secondary | ICD-10-CM | POA: Diagnosis not present

## 2013-11-29 DIAGNOSIS — S99919A Unspecified injury of unspecified ankle, initial encounter: Secondary | ICD-10-CM | POA: Diagnosis not present

## 2013-11-29 DIAGNOSIS — R279 Unspecified lack of coordination: Secondary | ICD-10-CM | POA: Diagnosis not present

## 2013-11-29 DIAGNOSIS — Z471 Aftercare following joint replacement surgery: Secondary | ICD-10-CM | POA: Diagnosis not present

## 2013-11-29 DIAGNOSIS — S8990XA Unspecified injury of unspecified lower leg, initial encounter: Secondary | ICD-10-CM | POA: Diagnosis not present

## 2013-11-29 DIAGNOSIS — R269 Unspecified abnormalities of gait and mobility: Secondary | ICD-10-CM | POA: Diagnosis not present

## 2013-11-29 DIAGNOSIS — E785 Hyperlipidemia, unspecified: Secondary | ICD-10-CM | POA: Diagnosis not present

## 2013-11-29 DIAGNOSIS — M25569 Pain in unspecified knee: Secondary | ICD-10-CM | POA: Diagnosis not present

## 2013-11-29 DIAGNOSIS — F329 Major depressive disorder, single episode, unspecified: Secondary | ICD-10-CM | POA: Diagnosis not present

## 2013-11-29 DIAGNOSIS — I10 Essential (primary) hypertension: Secondary | ICD-10-CM | POA: Diagnosis not present

## 2013-11-29 DIAGNOSIS — D62 Acute posthemorrhagic anemia: Secondary | ICD-10-CM | POA: Diagnosis not present

## 2013-11-29 DIAGNOSIS — IMO0002 Reserved for concepts with insufficient information to code with codable children: Secondary | ICD-10-CM | POA: Diagnosis not present

## 2013-11-29 DIAGNOSIS — M1710 Unilateral primary osteoarthritis, unspecified knee: Secondary | ICD-10-CM | POA: Diagnosis not present

## 2013-11-29 DIAGNOSIS — Z96659 Presence of unspecified artificial knee joint: Secondary | ICD-10-CM | POA: Diagnosis not present

## 2013-11-29 LAB — CBC
HCT: 35.4 % — ABNORMAL LOW (ref 36.0–46.0)
HEMOGLOBIN: 11.7 g/dL — AB (ref 12.0–15.0)
MCH: 32.3 pg (ref 26.0–34.0)
MCHC: 33.1 g/dL (ref 30.0–36.0)
MCV: 97.8 fL (ref 78.0–100.0)
Platelets: 183 10*3/uL (ref 150–400)
RBC: 3.62 MIL/uL — ABNORMAL LOW (ref 3.87–5.11)
RDW: 13.4 % (ref 11.5–15.5)
WBC: 7.5 10*3/uL (ref 4.0–10.5)

## 2013-11-29 MED ORDER — HYDROCODONE-ACETAMINOPHEN 10-325 MG PO TABS: 1.0000 | ORAL_TABLET | Freq: Four times a day (QID) | ORAL | Status: DC | PRN

## 2013-11-29 MED ORDER — ASPIRIN 325 MG PO TBEC: 325.0000 mg | DELAYED_RELEASE_TABLET | Freq: Two times a day (BID) | ORAL | Status: DC

## 2013-11-29 MED ORDER — METHOCARBAMOL 500 MG PO TABS: 500.0000 mg | ORAL_TABLET | Freq: Four times a day (QID) | ORAL | Status: DC | PRN

## 2013-11-29 NOTE — Discharge Summary (Signed)
Patient ID: Gina Petty MRN: [id number] DOB/AGE: 66-Dec-1947 66 y.o.  Admit date: 11/26/2013 Discharge date: 11/29/2013  Admission Diagnoses:  Principal Problem:   Right knee DJD   Discharge Diagnoses:  Same  Past Medical History  Diagnosis Date  . Hypertension   . PE (pulmonary embolism)     posssible 74  not able to prove  . Depression   . Arthritis     Surgeries: Procedure(s): TOTAL KNEE ARTHROPLASTY on 11/26/2013   Consultants:    Discharged Condition: Improved  Hospital Course: Gina Petty is an 68 y.o. female who was admitted 11/26/2013 for operative treatment ofRight knee DJD. Patient has severe unremitting pain that affects sleep, daily activities, and work/hobbies. After pre-op clearance the patient was taken to the operating room on 11/26/2013 and underwent  Procedure(s): TOTAL KNEE ARTHROPLASTY.    Patient was given perioperative antibiotics: Anti-infectives   Start     Dose/Rate Route Frequency Ordered Stop   11/26/13 1630  ceFAZolin (ANCEF) IVPB 2 g/50 mL premix     2 g 100 mL/hr over 30 Minutes Intravenous Every 6 hours 11/26/13 1532 11/26/13 2245   11/26/13 1007  ceFAZolin (ANCEF) 2-3 GM-% IVPB SOLR    Comments:  Key, Kristopher   : cabinet override      11/26/13 1007 11/26/13 1035   11/26/13 0600  ceFAZolin (ANCEF) IVPB 2 g/50 mL premix  Status:  Discontinued     2 g 100 mL/hr over 30 Minutes Intravenous On call to O.R. 11/25/13 1412 11/26/13 1828       Patient was given sequential compression devices, early ambulation, and chemoprophylaxis to prevent DVT.  Patient benefited maximally from hospital stay and there were no complications.    Recent vital signs: Patient Vitals for the past 24 hrs:  BP Temp Temp src Pulse Resp SpO2  11/29/13 0527 134/65 mmHg 98.4 F (36.9 C) Oral 97 18 97 %  11/28/13 1900 123/45 mmHg 97.9 F (36.6 C) Oral 88 20 100 %  11/28/13 1526 - - - - 18 98 %  11/28/13 1326 126/62 mmHg 98.7 F (37.1 C) Oral 96 18 98 %  11/28/13  1200 - - - - 18 98 %     Recent laboratory studies:  Recent Labs  11/27/13 0505 11/28/13 0500 11/29/13 0455  WBC 9.0 8.4 7.5  HGB 12.9 12.6 11.7*  HCT 39.0 38.0 35.4*  PLT 213 186 183  NA 138  --   --   K 4.0  --   --   CL 99  --   --   CO2 25  --   --   BUN 20  --   --   CREATININE 1.03  --   --   GLUCOSE 144*  --   --   CALCIUM 9.1  --   --      Discharge Medications:     Medication List    TAKE these medications       acetaminophen 500 MG tablet  Commonly known as:  TYLENOL  Take 1,000 mg by mouth daily as needed for mild pain.     aspirin 325 MG EC tablet  Take 1 tablet (325 mg total) by mouth 2 (two) times daily after a meal.     buPROPion 150 MG 24 hr tablet  Commonly known as:  WELLBUTRIN XL  Take 150 mg by mouth daily.     cholecalciferol 1000 UNITS tablet  Commonly known as:  VITAMIN D  Take 1,000 Units  by mouth daily.     HYDROcodone-acetaminophen 10-325 MG per tablet  Commonly known as:  NORCO  Take 1-2 tablets by mouth every 6 (six) hours as needed.     methocarbamol 500 MG tablet  Commonly known as:  ROBAXIN  Take 1 tablet (500 mg total) by mouth every 6 (six) hours as needed for muscle spasms.     multivitamin tablet  Take 1 tablet by mouth daily.     pravastatin 40 MG tablet  Commonly known as:  PRAVACHOL  Take 40 mg by mouth daily.     triamterene-hydrochlorothiazide 37.5-25 MG per tablet  Commonly known as:  MAXZIDE-25  Take 1 tablet by mouth daily.      ASK your doctor about these medications       VITAMIN B-3 PO  Take 1 tablet by mouth daily.        Diagnostic Studies: Dg Chest 2 View  11/19/2013   CLINICAL DATA:  Preoperative radiograph.  Right knee arthroplasty.  EXAM: CHEST  2 VIEW  COMPARISON:  None.  FINDINGS: Cardiopericardial silhouette within normal limits. Mediastinal contours normal. Trachea midline. No airspace disease or effusion. Right AC joint osteoarthritis.  IMPRESSION: No active cardiopulmonary disease.    Electronically Signed   By: Dereck Ligas M.D.   On: 11/19/2013 12:37    Disposition: Final discharge disposition not confirmed      Discharge Instructions   Call MD / Call 911    Complete by:  As directed   If you experience chest pain or shortness of breath, CALL 911 and be transported to the hospital emergency room.  If you develope a fever above 101 F, pus (white drainage) or increased drainage or redness at the wound, or calf pain, call your surgeon's office.     Constipation Prevention    Complete by:  As directed   Drink plenty of fluids.  Prune juice may be helpful.  You may use a stool softener, such as Colace (over the counter) 100 mg twice a day.  Use MiraLax (over the counter) for constipation as needed.     Diet - low sodium heart healthy    Complete by:  As directed      Increase activity slowly as tolerated    Complete by:  As directed            Follow-up Information   Follow up with [name], MD. Call in 2 weeks.   Specialty:  Orthopedic Surgery   Contact information:   [place] [address] [phone]        Signed: Larwance Sachs Katianna Mcclenney 11/29/2013, 8:06 AM

## 2013-11-29 NOTE — Progress Notes (Signed)
Patient being discharged to [facility] for continued rehabilitation post Right Total Knee 5/12. She is being transported by non-emergent ambulance.

## 2013-11-29 NOTE — Progress Notes (Signed)
Subjective: 3 Days Post-Op Procedure(s) (LRB): TOTAL KNEE ARTHROPLASTY (Right)  Activity level:  wbat Diet tolerance:  Eating well Voiding:  ok Patient reports pain as 2 on 0-10 scale.    Objective: Vital signs in last 24 hours: Temp:  [97.9 F (36.6 C)-98.7 F (37.1 C)] 98.4 F (36.9 C) (05/15 0527) Pulse Rate:  [88-97] 97 (05/15 0527) Resp:  [18-20] 18 (05/15 0527) BP: (123-134)/(45-65) 134/65 mmHg (05/15 0527) SpO2:  [97 %-100 %] 97 % (05/15 0527)  Labs:  Recent Labs  11/27/13 0505 11/28/13 0500 11/29/13 0455  HGB 12.9 12.6 11.7*    Recent Labs  11/28/13 0500 11/29/13 0455  WBC 8.4 7.5  RBC 3.89 3.62*  HCT 38.0 35.4*  PLT 186 183    Recent Labs  11/27/13 0505  NA 138  K 4.0  CL 99  CO2 25  BUN 20  CREATININE 1.03  GLUCOSE 144*  CALCIUM 9.1   No results found for this basename: LABPT, INR,  in the last 72 hours  Physical Exam:  Neurologically intact ABD soft Neurovascular intact Sensation intact distally Intact pulses distally Dorsiflexion/Plantar flexion intact Incision: dressing C/D/I and scant drainage No cellulitis present  Assessment/Plan:  3 Days Post-Op Procedure(s) (LRB): TOTAL KNEE ARTHROPLASTY (Right) Advance diet Up with therapy Discharge to SNF today Continue ASA 325mg  BID x 2 weeks Follow up in office in 2 weeks Reinforce dressing as needed.    Gina Petty Gina Petty 11/29/2013, 8:05 AM

## 2013-12-01 NOTE — Clinical Social Work Psychosocial (Signed)
Clinical Social Work Department  BRIEF PSYCHOSOCIAL ASSESSMENT  Patient:Gina Petty Account [id number]   Admit date: 11/26/12 Clinical Social Worker Debar Plate Lia Foyer, MSW Date/Time:  Referred by: Physician Date Referred:  Referred for   SNF Placement   Other Referral:  Interview type: Patient at bedside Other interview type: PSYCHOSOCIAL DATA  Living Status: Alone Admitted from facility:  Level of care:  Primary support name: Gina Petty Primary support relationship to patient: UNK Degree of support available:  Strong and vested- Per Patient  CURRENT CONCERNS  Current Concerns   Post-Acute Placement   Other Concerns:  SOCIAL WORK ASSESSMENT / PLAN  CSW met with pt to offer support and discuss SNF placement. [name] reported that she pre-registered at [facility].  re: PT recommendation for SNF.   Pt lives alone  CSW explained placement process and answered questions.   Pt reports [facility]  as her preference    CSW completed FL2 and initiated SNF search.     Assessment/plan status: Information/Referral to [facility]  Other assessment/ plan:  Information/referral to community resources:  SNF     PATIENT'S/FAMILY'S RESPONSE TO PLAN OF CARE:  Pt  reports she is agreeable to ST SNF in order to increase strength and independence with mobility prior to returning home  Pt verbalized understanding of placement process and appreciation for CSW assist.   Gina Petty, MSW, [place]

## 2013-12-01 NOTE — Clinical Social Work Placement (Signed)
Clinical Social Work Department  CLINICAL SOCIAL WORK PLACEMENT NOTE   Patient: Gina Petty  Account Number: [id number]  Admit date: 11/26/12  Clinical Social Worker: Rhea Pink LCSWA Date/time: 11/28/12 11:30 AM  Clinical Social Work is seeking post-discharge placement for this patient at the following level of care: SKILLED NURSING (*CSW will update this form in Epic as items are completed)  11/28/2013 Patient/family provided with [place] Department of Clinical Social Work's list of facilities offering this level of care within the geographic area requested by the patient (or if unable, by the patient's family).  11/28/2013 Patient/family informed of their freedom to choose among providers that offer the needed level of care, that participate in Medicare, Medicaid or managed care program needed by the patient, have an available bed and are willing to accept the patient.  11/28/2013 Patient/family informed of [facility]' ownership interest in [facility], as well as of the fact that they are under no obligation to receive care at this facility.  PASARR submitted to EDS on 11/28/2013 PASARR number received from EDS on05/14/2015 FL2 transmitted to all facilities in geographic area requested by pt/family on 11/28/2013 FL2 transmitted to all facilities within larger geographic area on  Patient informed that his/her managed care company has contracts with or will negotiate with certain facilities, including the following:  Patient/family informed of bed offers received:11/28/2013 Patient chooses bed at [facility] Physician recommends and patient chooses bed at  Patient to be transferred to on 11/29/3013 Patient to be transferred to facility by [facility] The following physician request were entered in Epic:  Additional Comments:

## 2013-12-01 NOTE — Progress Notes (Signed)
Clinical social worker assisted with patient discharge to skilled nursing facility, [facility].  CSW addressed all family questions and concerns. CSW copied chart and added all important documents. CSW also set up patient transportation with [employer] operational officer. Clinical Social Worker will sign off for now as social work intervention is no longer needed.   Rhea Pink, MSW, [place]

## 2013-12-02 ENCOUNTER — Encounter: Payer: Self-pay | Admitting: *Deleted

## 2013-12-02 ENCOUNTER — Other Ambulatory Visit: Payer: Self-pay | Admitting: *Deleted

## 2013-12-02 MED ORDER — HYDROCODONE-ACETAMINOPHEN 10-325 MG PO TABS: ORAL_TABLET | ORAL | Status: DC

## 2013-12-02 NOTE — Telephone Encounter (Signed)
rx to be faxed to [facility] @ [phone].

## 2013-12-03 ENCOUNTER — Non-Acute Institutional Stay (SKILLED_NURSING_FACILITY): Payer: Medicare Other | Admitting: Internal Medicine

## 2013-12-03 DIAGNOSIS — D62 Acute posthemorrhagic anemia: Secondary | ICD-10-CM | POA: Diagnosis not present

## 2013-12-03 DIAGNOSIS — M1710 Unilateral primary osteoarthritis, unspecified knee: Secondary | ICD-10-CM

## 2013-12-03 DIAGNOSIS — F329 Major depressive disorder, single episode, unspecified: Secondary | ICD-10-CM | POA: Diagnosis not present

## 2013-12-03 DIAGNOSIS — M1711 Unilateral primary osteoarthritis, right knee: Secondary | ICD-10-CM

## 2013-12-03 DIAGNOSIS — IMO0002 Reserved for concepts with insufficient information to code with codable children: Secondary | ICD-10-CM | POA: Diagnosis not present

## 2013-12-03 DIAGNOSIS — F3289 Other specified depressive episodes: Secondary | ICD-10-CM | POA: Diagnosis not present

## 2013-12-03 DIAGNOSIS — I10 Essential (primary) hypertension: Secondary | ICD-10-CM | POA: Diagnosis not present

## 2013-12-03 NOTE — Progress Notes (Signed)
HISTORY & PHYSICAL  DATE: 12/03/2013   FACILITY: [facility]  LEVEL OF CARE: SNF (31)  ALLERGIES:  Allergies  Allergen Reactions  . Penicillins Rash    CHIEF COMPLAINT:  Manage right knee osteoarthritis, hypertension and depression  HISTORY OF PRESENT ILLNESS: Patient is a 68 year old Caucasian female.  KNEE OSTEOARTHRITIS: Patient had a history of pain and functional disability in the knee due to end-stage osteoarthritis and has failed nonsurgical conservative treatments. Patient had worsening of pain with activity and weight bearing, pain that interfered with activities of daily living & pain with passive range of motion. Therefore patient underwent total knee arthroplasty and tolerated the procedure well. Patient is admitted to this facility for sort short-term rehabilitation. Patient denies knee pain.  HTN: Pt 's HTN remains stable.  Denies CP, sob, DOE, pedal edema, headaches, dizziness or visual disturbances.  No complications from the medications currently being used.  Last BP : 126/75.  DEPRESSION: The depression remains stable. Patient denies ongoing feelings of sadness, insomnia, anedhonia or lack of appetite. No complications reported from the medications currently being used. Staff do not report behavioral problems.  PAST MEDICAL HISTORY :  Past Medical History  Diagnosis Date  . Hypertension   . PE (pulmonary embolism)     posssible 74  not able to prove  . Depression   . Arthritis     PAST SURGICAL HISTORY: Past Surgical History  Procedure Laterality Date  . Back surgery  85  . Total knee arthroplasty Right 11/26/2013    DR DALLDORF  . Cataract extraction    . Total knee arthroplasty Right 11/26/2013    Procedure: TOTAL KNEE ARTHROPLASTY;  Surgeon: [name], MD;  Location: [facility];  Service: Orthopedics;  Laterality: Right;  Right total knee arthroplasty    SOCIAL HISTORY:  reports that she quit smoking about 13 years  ago. Her smoking use included Cigarettes. She has a 25 pack-year smoking history. She has never used smokeless tobacco. She reports that she drinks about 7.2 ounces of alcohol per week. She reports that she does not use illicit drugs.  FAMILY HISTORY: None  CURRENT MEDICATIONS: Reviewed per MAR/see medication list  REVIEW OF SYSTEMS:  See HPI otherwise 14 point ROS is negative.  PHYSICAL EXAMINATION  VS:  See VS section  GENERAL: no acute distress, moderately obese body habitus EYES: conjunctivae normal, sclerae normal, normal eye lids MOUTH/THROAT: lips without lesions,no lesions in the mouth,tongue is without lesions,uvula elevates in midline NECK: supple, trachea midline, no neck masses, no thyroid tenderness, no thyromegaly LYMPHATICS: no LAN in the neck, no supraclavicular LAN RESPIRATORY: breathing is even & unlabored, BS CTAB CARDIAC: RRR, no murmur,no extra heart sounds, +1 right lower extremity edema GI:  ABDOMEN: abdomen soft, normal BS, no masses, no tenderness  LIVER/SPLEEN: no hepatomegaly, no splenomegaly MUSCULOSKELETAL: HEAD: normal to inspection & palpation BACK: no kyphosis, scoliosis or spinal processes tenderness EXTREMITIES: LEFT UPPER EXTREMITY: full range of motion, normal strength & tone RIGHT UPPER EXTREMITY:  full range of motion, normal strength & tone LEFT LOWER EXTREMITY:  full range of motion, normal strength & tone RIGHT LOWER EXTREMITY:  range of motion not tested due to surgery, normal strength & tone PSYCHIATRIC: the patient is alert & oriented to person, affect & behavior appropriate  LABS/RADIOLOGY:  Labs reviewed: Basic Metabolic Panel:  Recent Labs  11/19/13 1033 11/27/13 0505  NA 142 138  K 4.5 4.0  CL 103 99  CO2 26 25  GLUCOSE 92 144*  BUN 21 20  CREATININE 1.00 1.03  CALCIUM 9.3 9.1   CBC:  Recent Labs  11/19/13 1033 11/27/13 0505 11/28/13 0500 11/29/13 0455  WBC 5.3 9.0 8.4 7.5  NEUTROABS 2.9  --   --   --   HGB  14.3 12.9 12.6 11.7*  HCT 43.5 39.0 38.0 35.4*  MCV 97.3 97.0 97.7 97.8  PLT 212 213 186 183   CHEST  2 VIEW   COMPARISON:  None.   FINDINGS: Cardiopericardial silhouette within normal limits. Mediastinal contours normal. Trachea midline. No airspace disease or effusion. Right AC joint osteoarthritis.   IMPRESSION: No active cardiopulmonary disease   ASSESSMENT/PLAN:  Right knee osteoarthritis-status post total knee arthroplasty. Continue rehabilitation. Hypertension-well controlled Depression-denies ongoing symptoms Acute blood loss anemia-check hemoglobin level  hyperlipidemia-continue pravastatin Check CBC and BMP  I have reviewed patient's medical records received at admission/from hospitalization.  CPT CODE: 82505  Gayani Y Dasanayaka, [place] [phone]

## 2013-12-11 DIAGNOSIS — M1710 Unilateral primary osteoarthritis, unspecified knee: Secondary | ICD-10-CM | POA: Diagnosis not present

## 2013-12-12 ENCOUNTER — Non-Acute Institutional Stay (SKILLED_NURSING_FACILITY): Payer: Medicare Other | Admitting: Adult Health

## 2013-12-12 ENCOUNTER — Encounter: Payer: Self-pay | Admitting: Adult Health

## 2013-12-12 DIAGNOSIS — F3289 Other specified depressive episodes: Secondary | ICD-10-CM | POA: Diagnosis not present

## 2013-12-12 DIAGNOSIS — D62 Acute posthemorrhagic anemia: Secondary | ICD-10-CM | POA: Diagnosis not present

## 2013-12-12 DIAGNOSIS — E785 Hyperlipidemia, unspecified: Secondary | ICD-10-CM | POA: Insufficient documentation

## 2013-12-12 DIAGNOSIS — I10 Essential (primary) hypertension: Secondary | ICD-10-CM

## 2013-12-12 DIAGNOSIS — F329 Major depressive disorder, single episode, unspecified: Secondary | ICD-10-CM

## 2013-12-12 DIAGNOSIS — M1710 Unilateral primary osteoarthritis, unspecified knee: Secondary | ICD-10-CM

## 2013-12-12 DIAGNOSIS — IMO0002 Reserved for concepts with insufficient information to code with codable children: Secondary | ICD-10-CM

## 2013-12-12 DIAGNOSIS — M1711 Unilateral primary osteoarthritis, right knee: Secondary | ICD-10-CM

## 2013-12-12 NOTE — Progress Notes (Signed)
Patient ID: Gina Petty, female   DOB: 06-Jun-1946, 68 y.o.   MRN: [id number]              PROGRESS NOTE  DATE: 12/12/2013   FACILITY: [facility]  LEVEL OF CARE: SNF (31)  Acute Visit  CHIEF COMPLAINT:  Discharge Notes  HISTORY OF PRESENT ILLNESS:  This is a 68 year old female who is for discharge home. DME: Rolling walker. She has been admitted to [facility] on 11/29/13 from [facility] with Right knee DJD S/P right total knee arthroplasty. Patient was admitted to this facility for short-term rehabilitation after the patient's recent hospitalization.  Patient has completed SNF rehabilitation and therapy has cleared the patient for discharge.  Reassessment of ongoing problem(s):  HTN: Pt 's HTN remains stable.  Denies CP, sob, DOE, pedal edema, headaches, dizziness or visual disturbances.  No complications from the medications currently being used.  Last BP : 122/78  DEPRESSION: The depression remains stable. Patient denies ongoing feelings of sadness, insomnia, anedhonia or lack of appetite. No complications reported from the medications currently being used. Staff do not report behavioral problems.  ANEMIA: The anemia has been stable. The patient denies fatigue, melena or hematochezia. No complications from the medications currently being used. 5/15 hgb 11.7   PAST MEDICAL HISTORY : Reviewed.  No changes/see problem list  CURRENT MEDICATIONS: Reviewed per MAR/see medication list  REVIEW OF SYSTEMS:  GENERAL: no change in appetite, no fatigue, no weight changes, no fever, chills or weakness RESPIRATORY: no cough, SOB, DOE, wheezing, hemoptysis CARDIAC: no chest pain, edema or palpitations GI: no abdominal pain, diarrhea, constipation, heart burn, nausea or vomiting  PHYSICAL EXAMINATION  GENERAL: no acute distress, normal body habitus EYES: conjunctivae normal, sclerae normal, normal eye lids NECK: supple, trachea midline, no neck masses, no thyroid  tenderness, no thyromegaly LYMPHATICS: no LAN in the neck, no supraclavicular LAN RESPIRATORY: breathing is even & unlabored, BS CTAB CARDIAC: RRR, no murmur,no extra heart sounds, no edema GI: abdomen soft, normal BS, no masses, no tenderness, no hepatomegaly, no splenomegaly EXTREMITIES: able to move all 4 extremities; ambulates with walker PSYCHIATRIC: the patient is alert & oriented to person, affect & behavior appropriate  LABS/RADIOLOGY: Labs reviewed: Basic Metabolic Panel:  Recent Labs  11/19/13 1033 11/27/13 0505  NA 142 138  K 4.5 4.0  CL 103 99  CO2 26 25  GLUCOSE 92 144*  BUN 21 20  CREATININE 1.00 1.03  CALCIUM 9.3 9.1   CBC:  Recent Labs  11/19/13 1033 11/27/13 0505 11/28/13 0500 11/29/13 0455  WBC 5.3 9.0 8.4 7.5  NEUTROABS 2.9  --   --   --   HGB 14.3 12.9 12.6 11.7*  HCT 43.5 39.0 38.0 35.4*  MCV 97.3 97.0 97.7 97.8  PLT 212 213 186 183     ASSESSMENT/PLAN:  Right knee DJD status post right total knee arthroplasty -  Just finished short-term rehabilitation Hyperlipidemia - continue Atorvastatin Hypertension - well-controlled; continue Maxzide Anemia, acute blood loss - stable   I have filled out patient's discharge paperwork and written prescriptions.    DME provided: Rolling walker  Total discharge time: Less than 30 minutes  Discharge time involved coordination of the discharge process with Education officer, museum, nursing staff and therapy department. Medical justification for DME verified.  CPT CODE: 10175  Seth Bake - NP [facility] [phone]

## 2013-12-17 DIAGNOSIS — M25569 Pain in unspecified knee: Secondary | ICD-10-CM | POA: Diagnosis not present

## 2013-12-20 DIAGNOSIS — M25569 Pain in unspecified knee: Secondary | ICD-10-CM | POA: Diagnosis not present

## 2013-12-23 DIAGNOSIS — M25569 Pain in unspecified knee: Secondary | ICD-10-CM | POA: Diagnosis not present

## 2013-12-26 DIAGNOSIS — M25569 Pain in unspecified knee: Secondary | ICD-10-CM | POA: Diagnosis not present

## 2013-12-30 DIAGNOSIS — M25569 Pain in unspecified knee: Secondary | ICD-10-CM | POA: Diagnosis not present

## 2014-01-01 DIAGNOSIS — M25569 Pain in unspecified knee: Secondary | ICD-10-CM | POA: Diagnosis not present

## 2014-01-07 DIAGNOSIS — M25569 Pain in unspecified knee: Secondary | ICD-10-CM | POA: Diagnosis not present

## 2014-01-08 DIAGNOSIS — M25569 Pain in unspecified knee: Secondary | ICD-10-CM | POA: Diagnosis not present

## 2014-01-14 DIAGNOSIS — M25569 Pain in unspecified knee: Secondary | ICD-10-CM | POA: Diagnosis not present

## 2014-01-22 DIAGNOSIS — M25569 Pain in unspecified knee: Secondary | ICD-10-CM | POA: Diagnosis not present

## 2014-02-24 DIAGNOSIS — M25569 Pain in unspecified knee: Secondary | ICD-10-CM | POA: Diagnosis not present

## 2014-04-16 DIAGNOSIS — I129 Hypertensive chronic kidney disease with stage 1 through stage 4 chronic kidney disease, or unspecified chronic kidney disease: Secondary | ICD-10-CM | POA: Diagnosis not present

## 2014-04-16 DIAGNOSIS — Z23 Encounter for immunization: Secondary | ICD-10-CM | POA: Diagnosis not present

## 2014-04-16 DIAGNOSIS — F339 Major depressive disorder, recurrent, unspecified: Secondary | ICD-10-CM | POA: Diagnosis not present

## 2014-04-16 DIAGNOSIS — E785 Hyperlipidemia, unspecified: Secondary | ICD-10-CM | POA: Diagnosis not present

## 2014-04-16 DIAGNOSIS — N183 Chronic kidney disease, stage 3 unspecified: Secondary | ICD-10-CM | POA: Diagnosis not present

## 2014-04-16 DIAGNOSIS — E559 Vitamin D deficiency, unspecified: Secondary | ICD-10-CM | POA: Diagnosis not present

## 2014-04-16 DIAGNOSIS — M1710 Unilateral primary osteoarthritis, unspecified knee: Secondary | ICD-10-CM | POA: Diagnosis not present

## 2014-05-01 DIAGNOSIS — M720 Palmar fascial fibromatosis [Dupuytren]: Secondary | ICD-10-CM | POA: Diagnosis not present

## 2014-05-01 DIAGNOSIS — S60222A Contusion of left hand, initial encounter: Secondary | ICD-10-CM | POA: Diagnosis not present

## 2014-05-26 DIAGNOSIS — Z96659 Presence of unspecified artificial knee joint: Secondary | ICD-10-CM | POA: Diagnosis not present

## 2014-05-26 DIAGNOSIS — M1712 Unilateral primary osteoarthritis, left knee: Secondary | ICD-10-CM | POA: Diagnosis not present

## 2014-10-21 DIAGNOSIS — E559 Vitamin D deficiency, unspecified: Secondary | ICD-10-CM | POA: Diagnosis not present

## 2014-10-21 DIAGNOSIS — N183 Chronic kidney disease, stage 3 (moderate): Secondary | ICD-10-CM | POA: Diagnosis not present

## 2014-10-21 DIAGNOSIS — F419 Anxiety disorder, unspecified: Secondary | ICD-10-CM | POA: Diagnosis not present

## 2014-10-21 DIAGNOSIS — Z86010 Personal history of colonic polyps: Secondary | ICD-10-CM | POA: Diagnosis not present

## 2014-10-21 DIAGNOSIS — Z0000 Encounter for general adult medical examination without abnormal findings: Secondary | ICD-10-CM | POA: Diagnosis not present

## 2014-10-21 DIAGNOSIS — F339 Major depressive disorder, recurrent, unspecified: Secondary | ICD-10-CM | POA: Diagnosis not present

## 2014-10-21 DIAGNOSIS — E785 Hyperlipidemia, unspecified: Secondary | ICD-10-CM | POA: Diagnosis not present

## 2014-10-21 DIAGNOSIS — I129 Hypertensive chronic kidney disease with stage 1 through stage 4 chronic kidney disease, or unspecified chronic kidney disease: Secondary | ICD-10-CM | POA: Diagnosis not present

## 2014-10-29 ENCOUNTER — Encounter: Payer: Self-pay | Admitting: Internal Medicine

## 2014-11-24 DIAGNOSIS — Z09 Encounter for follow-up examination after completed treatment for conditions other than malignant neoplasm: Secondary | ICD-10-CM | POA: Diagnosis not present

## 2014-11-24 DIAGNOSIS — M1712 Unilateral primary osteoarthritis, left knee: Secondary | ICD-10-CM | POA: Diagnosis not present

## 2014-11-24 DIAGNOSIS — Z96651 Presence of right artificial knee joint: Secondary | ICD-10-CM | POA: Diagnosis not present

## 2015-03-12 DIAGNOSIS — Z1289 Encounter for screening for malignant neoplasm of other sites: Secondary | ICD-10-CM | POA: Diagnosis not present

## 2015-03-12 DIAGNOSIS — Z1231 Encounter for screening mammogram for malignant neoplasm of breast: Secondary | ICD-10-CM | POA: Diagnosis not present

## 2015-03-17 ENCOUNTER — Other Ambulatory Visit: Payer: Self-pay | Admitting: Obstetrics and Gynecology

## 2015-03-17 DIAGNOSIS — R928 Other abnormal and inconclusive findings on diagnostic imaging of breast: Secondary | ICD-10-CM

## 2015-03-20 ENCOUNTER — Other Ambulatory Visit: Payer: Self-pay

## 2015-03-26 ENCOUNTER — Other Ambulatory Visit: Payer: Self-pay | Admitting: Obstetrics and Gynecology

## 2015-03-26 ENCOUNTER — Ambulatory Visit
Admission: RE | Admit: 2015-03-26 | Discharge: 2015-03-26 | Disposition: A | Discharge status: Home / Self Care | Payer: Medicare Other | Source: Ambulatory Visit | Attending: Obstetrics and Gynecology | Admitting: Obstetrics and Gynecology

## 2015-03-26 DIAGNOSIS — R921 Mammographic calcification found on diagnostic imaging of breast: Secondary | ICD-10-CM | POA: Diagnosis not present

## 2015-03-26 DIAGNOSIS — R928 Other abnormal and inconclusive findings on diagnostic imaging of breast: Secondary | ICD-10-CM

## 2015-04-02 DIAGNOSIS — L814 Other melanin hyperpigmentation: Secondary | ICD-10-CM | POA: Diagnosis not present

## 2015-04-02 DIAGNOSIS — D2272 Melanocytic nevi of left lower limb, including hip: Secondary | ICD-10-CM | POA: Diagnosis not present

## 2015-04-02 DIAGNOSIS — L409 Psoriasis, unspecified: Secondary | ICD-10-CM | POA: Diagnosis not present

## 2015-04-02 DIAGNOSIS — Z23 Encounter for immunization: Secondary | ICD-10-CM | POA: Diagnosis not present

## 2015-04-02 DIAGNOSIS — L570 Actinic keratosis: Secondary | ICD-10-CM | POA: Diagnosis not present

## 2015-04-02 DIAGNOSIS — D1801 Hemangioma of skin and subcutaneous tissue: Secondary | ICD-10-CM | POA: Diagnosis not present

## 2015-04-02 DIAGNOSIS — D692 Other nonthrombocytopenic purpura: Secondary | ICD-10-CM | POA: Diagnosis not present

## 2015-04-02 DIAGNOSIS — L821 Other seborrheic keratosis: Secondary | ICD-10-CM | POA: Diagnosis not present

## 2015-04-22 DIAGNOSIS — N183 Chronic kidney disease, stage 3 (moderate): Secondary | ICD-10-CM | POA: Diagnosis not present

## 2015-04-22 DIAGNOSIS — M179 Osteoarthritis of knee, unspecified: Secondary | ICD-10-CM | POA: Diagnosis not present

## 2015-04-22 DIAGNOSIS — E785 Hyperlipidemia, unspecified: Secondary | ICD-10-CM | POA: Diagnosis not present

## 2015-04-22 DIAGNOSIS — E559 Vitamin D deficiency, unspecified: Secondary | ICD-10-CM | POA: Diagnosis not present

## 2015-04-22 DIAGNOSIS — F419 Anxiety disorder, unspecified: Secondary | ICD-10-CM | POA: Diagnosis not present

## 2015-04-22 DIAGNOSIS — E162 Hypoglycemia, unspecified: Secondary | ICD-10-CM | POA: Diagnosis not present

## 2015-04-22 DIAGNOSIS — G629 Polyneuropathy, unspecified: Secondary | ICD-10-CM | POA: Diagnosis not present

## 2015-04-22 DIAGNOSIS — R202 Paresthesia of skin: Secondary | ICD-10-CM | POA: Diagnosis not present

## 2015-04-22 DIAGNOSIS — F339 Major depressive disorder, recurrent, unspecified: Secondary | ICD-10-CM | POA: Diagnosis not present

## 2015-04-22 DIAGNOSIS — I129 Hypertensive chronic kidney disease with stage 1 through stage 4 chronic kidney disease, or unspecified chronic kidney disease: Secondary | ICD-10-CM | POA: Diagnosis not present

## 2015-04-22 DIAGNOSIS — Z131 Encounter for screening for diabetes mellitus: Secondary | ICD-10-CM | POA: Diagnosis not present

## 2015-05-12 DIAGNOSIS — H26491 Other secondary cataract, right eye: Secondary | ICD-10-CM | POA: Diagnosis not present

## 2015-05-12 DIAGNOSIS — H52203 Unspecified astigmatism, bilateral: Secondary | ICD-10-CM | POA: Diagnosis not present

## 2015-05-12 DIAGNOSIS — H40013 Open angle with borderline findings, low risk, bilateral: Secondary | ICD-10-CM | POA: Diagnosis not present

## 2015-05-12 DIAGNOSIS — H35371 Puckering of macula, right eye: Secondary | ICD-10-CM | POA: Diagnosis not present

## 2015-05-28 DIAGNOSIS — H2640 Unspecified secondary cataract: Secondary | ICD-10-CM | POA: Diagnosis not present

## 2015-05-28 DIAGNOSIS — H26491 Other secondary cataract, right eye: Secondary | ICD-10-CM | POA: Diagnosis not present

## 2015-10-22 DIAGNOSIS — E559 Vitamin D deficiency, unspecified: Secondary | ICD-10-CM | POA: Diagnosis not present

## 2015-10-22 DIAGNOSIS — E785 Hyperlipidemia, unspecified: Secondary | ICD-10-CM | POA: Diagnosis not present

## 2015-10-22 DIAGNOSIS — Z0000 Encounter for general adult medical examination without abnormal findings: Secondary | ICD-10-CM | POA: Diagnosis not present

## 2015-10-22 DIAGNOSIS — I129 Hypertensive chronic kidney disease with stage 1 through stage 4 chronic kidney disease, or unspecified chronic kidney disease: Secondary | ICD-10-CM | POA: Diagnosis not present

## 2015-10-22 DIAGNOSIS — M170 Bilateral primary osteoarthritis of knee: Secondary | ICD-10-CM | POA: Diagnosis not present

## 2015-10-22 DIAGNOSIS — F339 Major depressive disorder, recurrent, unspecified: Secondary | ICD-10-CM | POA: Diagnosis not present

## 2015-10-22 DIAGNOSIS — Z86010 Personal history of colonic polyps: Secondary | ICD-10-CM | POA: Diagnosis not present

## 2015-10-22 DIAGNOSIS — N183 Chronic kidney disease, stage 3 (moderate): Secondary | ICD-10-CM | POA: Diagnosis not present

## 2015-12-18 DIAGNOSIS — T7840XA Allergy, unspecified, initial encounter: Secondary | ICD-10-CM | POA: Diagnosis not present

## 2016-04-22 DIAGNOSIS — F325 Major depressive disorder, single episode, in full remission: Secondary | ICD-10-CM | POA: Diagnosis not present

## 2016-04-22 DIAGNOSIS — M25562 Pain in left knee: Secondary | ICD-10-CM | POA: Diagnosis not present

## 2016-04-22 DIAGNOSIS — M25511 Pain in right shoulder: Secondary | ICD-10-CM | POA: Diagnosis not present

## 2016-04-22 DIAGNOSIS — M25512 Pain in left shoulder: Secondary | ICD-10-CM | POA: Diagnosis not present

## 2016-04-22 DIAGNOSIS — Z1211 Encounter for screening for malignant neoplasm of colon: Secondary | ICD-10-CM | POA: Diagnosis not present

## 2016-04-22 DIAGNOSIS — Z23 Encounter for immunization: Secondary | ICD-10-CM | POA: Diagnosis not present

## 2016-04-22 DIAGNOSIS — I129 Hypertensive chronic kidney disease with stage 1 through stage 4 chronic kidney disease, or unspecified chronic kidney disease: Secondary | ICD-10-CM | POA: Diagnosis not present

## 2016-04-22 DIAGNOSIS — M25561 Pain in right knee: Secondary | ICD-10-CM | POA: Diagnosis not present

## 2016-04-22 DIAGNOSIS — N183 Chronic kidney disease, stage 3 (moderate): Secondary | ICD-10-CM | POA: Diagnosis not present

## 2016-10-26 DIAGNOSIS — L821 Other seborrheic keratosis: Secondary | ICD-10-CM | POA: Diagnosis not present

## 2016-10-26 DIAGNOSIS — D2272 Melanocytic nevi of left lower limb, including hip: Secondary | ICD-10-CM | POA: Diagnosis not present

## 2016-10-26 DIAGNOSIS — L409 Psoriasis, unspecified: Secondary | ICD-10-CM | POA: Diagnosis not present

## 2016-10-26 DIAGNOSIS — D1801 Hemangioma of skin and subcutaneous tissue: Secondary | ICD-10-CM | POA: Diagnosis not present

## 2016-10-26 DIAGNOSIS — D225 Melanocytic nevi of trunk: Secondary | ICD-10-CM | POA: Diagnosis not present

## 2016-10-26 DIAGNOSIS — L570 Actinic keratosis: Secondary | ICD-10-CM | POA: Diagnosis not present

## 2016-10-26 DIAGNOSIS — L814 Other melanin hyperpigmentation: Secondary | ICD-10-CM | POA: Diagnosis not present

## 2016-11-02 DIAGNOSIS — N183 Chronic kidney disease, stage 3 (moderate): Secondary | ICD-10-CM | POA: Diagnosis not present

## 2016-11-02 DIAGNOSIS — I129 Hypertensive chronic kidney disease with stage 1 through stage 4 chronic kidney disease, or unspecified chronic kidney disease: Secondary | ICD-10-CM | POA: Diagnosis not present

## 2016-11-02 DIAGNOSIS — E559 Vitamin D deficiency, unspecified: Secondary | ICD-10-CM | POA: Diagnosis not present

## 2016-11-02 DIAGNOSIS — M25561 Pain in right knee: Secondary | ICD-10-CM | POA: Diagnosis not present

## 2016-11-02 DIAGNOSIS — E785 Hyperlipidemia, unspecified: Secondary | ICD-10-CM | POA: Diagnosis not present

## 2016-11-02 DIAGNOSIS — F325 Major depressive disorder, single episode, in full remission: Secondary | ICD-10-CM | POA: Diagnosis not present

## 2016-11-02 DIAGNOSIS — M25562 Pain in left knee: Secondary | ICD-10-CM | POA: Diagnosis not present

## 2016-11-15 DIAGNOSIS — I129 Hypertensive chronic kidney disease with stage 1 through stage 4 chronic kidney disease, or unspecified chronic kidney disease: Secondary | ICD-10-CM | POA: Diagnosis not present

## 2016-11-15 DIAGNOSIS — E559 Vitamin D deficiency, unspecified: Secondary | ICD-10-CM | POA: Diagnosis not present

## 2016-11-15 DIAGNOSIS — E785 Hyperlipidemia, unspecified: Secondary | ICD-10-CM | POA: Diagnosis not present

## 2017-04-20 DIAGNOSIS — E559 Vitamin D deficiency, unspecified: Secondary | ICD-10-CM | POA: Diagnosis not present

## 2017-04-20 DIAGNOSIS — E785 Hyperlipidemia, unspecified: Secondary | ICD-10-CM | POA: Diagnosis not present

## 2017-04-20 DIAGNOSIS — M2550 Pain in unspecified joint: Secondary | ICD-10-CM | POA: Diagnosis not present

## 2017-04-20 DIAGNOSIS — Z0000 Encounter for general adult medical examination without abnormal findings: Secondary | ICD-10-CM | POA: Diagnosis not present

## 2017-04-20 DIAGNOSIS — G603 Idiopathic progressive neuropathy: Secondary | ICD-10-CM | POA: Diagnosis not present

## 2017-04-20 DIAGNOSIS — M25511 Pain in right shoulder: Secondary | ICD-10-CM | POA: Diagnosis not present

## 2017-04-20 DIAGNOSIS — M25512 Pain in left shoulder: Secondary | ICD-10-CM | POA: Diagnosis not present

## 2017-04-20 DIAGNOSIS — Z23 Encounter for immunization: Secondary | ICD-10-CM | POA: Diagnosis not present

## 2017-04-20 DIAGNOSIS — F339 Major depressive disorder, recurrent, unspecified: Secondary | ICD-10-CM | POA: Diagnosis not present

## 2017-04-20 DIAGNOSIS — I10 Essential (primary) hypertension: Secondary | ICD-10-CM | POA: Diagnosis not present

## 2017-04-20 DIAGNOSIS — Z1211 Encounter for screening for malignant neoplasm of colon: Secondary | ICD-10-CM | POA: Diagnosis not present

## 2017-04-20 DIAGNOSIS — M170 Bilateral primary osteoarthritis of knee: Secondary | ICD-10-CM | POA: Diagnosis not present

## 2017-05-12 DIAGNOSIS — Z1231 Encounter for screening mammogram for malignant neoplasm of breast: Secondary | ICD-10-CM | POA: Diagnosis not present

## 2017-05-12 DIAGNOSIS — Z124 Encounter for screening for malignant neoplasm of cervix: Secondary | ICD-10-CM | POA: Diagnosis not present

## 2017-05-12 DIAGNOSIS — Z01419 Encounter for gynecological examination (general) (routine) without abnormal findings: Secondary | ICD-10-CM | POA: Diagnosis not present

## 2017-05-15 DIAGNOSIS — M1712 Unilateral primary osteoarthritis, left knee: Secondary | ICD-10-CM | POA: Diagnosis not present

## 2017-05-15 DIAGNOSIS — M1611 Unilateral primary osteoarthritis, right hip: Secondary | ICD-10-CM | POA: Diagnosis not present

## 2017-06-11 ENCOUNTER — Emergency Department (HOSPITAL_COMMUNITY)
Admission: EM | Admit: 2017-06-11 | Discharge: 2017-06-12 | Disposition: A | Discharge status: Home / Self Care | Payer: Medicare Other | Attending: Emergency Medicine | Admitting: Emergency Medicine

## 2017-06-11 ENCOUNTER — Other Ambulatory Visit: Payer: Self-pay

## 2017-06-11 ENCOUNTER — Encounter (HOSPITAL_COMMUNITY): Payer: Self-pay | Admitting: Emergency Medicine

## 2017-06-11 ENCOUNTER — Emergency Department (HOSPITAL_COMMUNITY): Payer: Medicare Other

## 2017-06-11 DIAGNOSIS — Z87891 Personal history of nicotine dependence: Secondary | ICD-10-CM | POA: Diagnosis not present

## 2017-06-11 DIAGNOSIS — Z7982 Long term (current) use of aspirin: Secondary | ICD-10-CM | POA: Insufficient documentation

## 2017-06-11 DIAGNOSIS — R519 Headache, unspecified: Secondary | ICD-10-CM

## 2017-06-11 DIAGNOSIS — Z96651 Presence of right artificial knee joint: Secondary | ICD-10-CM | POA: Diagnosis not present

## 2017-06-11 DIAGNOSIS — I10 Essential (primary) hypertension: Secondary | ICD-10-CM | POA: Diagnosis not present

## 2017-06-11 DIAGNOSIS — R51 Headache: Secondary | ICD-10-CM | POA: Insufficient documentation

## 2017-06-11 DIAGNOSIS — Z79899 Other long term (current) drug therapy: Secondary | ICD-10-CM | POA: Diagnosis not present

## 2017-06-11 MED ORDER — HALOPERIDOL LACTATE 5 MG/ML IJ SOLN
2.0000 mg | Freq: Once | INTRAMUSCULAR | Status: AC
  Administered 2017-06-11: 2 mg via INTRAVENOUS
  Filled 2017-06-11: qty 1

## 2017-06-11 MED ORDER — MAGNESIUM SULFATE 2 GM/50ML IV SOLN
2.0000 g | Freq: Once | INTRAVENOUS | Status: AC
  Administered 2017-06-11: 2 g via INTRAVENOUS
  Filled 2017-06-11: qty 50

## 2017-06-11 MED ORDER — DIPHENHYDRAMINE HCL 50 MG/ML IJ SOLN
25.0000 mg | Freq: Once | INTRAMUSCULAR | Status: AC
  Administered 2017-06-11: 25 mg via INTRAVENOUS
  Filled 2017-06-11: qty 1

## 2017-06-11 MED ORDER — PROCHLORPERAZINE EDISYLATE 5 MG/ML IJ SOLN
10.0000 mg | Freq: Once | INTRAMUSCULAR | Status: AC
  Administered 2017-06-11: 10 mg via INTRAVENOUS
  Filled 2017-06-11: qty 2

## 2017-06-11 MED ORDER — SODIUM CHLORIDE 0.9 % IV BOLUS (SEPSIS)
1000.0000 mL | Freq: Once | INTRAVENOUS | Status: AC
  Administered 2017-06-11: 1000 mL via INTRAVENOUS

## 2017-06-11 MED ORDER — DIAZEPAM 5 MG/ML IJ SOLN
5.0000 mg | Freq: Once | INTRAMUSCULAR | Status: AC
  Administered 2017-06-11: 5 mg via INTRAVENOUS
  Filled 2017-06-11: qty 2

## 2017-06-11 NOTE — ED Notes (Signed)
Call Elizebeth Koller [phone] when pt is being discharged or admitted.

## 2017-06-11 NOTE — ED Provider Notes (Signed)
[facility] EMERGENCY DEPARTMENT Provider Note   CSN: [id number] Arrival date & time: 06/11/17  1647     History   Chief Complaint Chief Complaint  Patient presents with  . Headache    HPI Gina Petty is a 70 y.o. female.  HPI   Presents with concern for headache that began on Thursday.  It started Thursday evening, worsened over a few hours, then has been waxing and waning but never going away.  Has tried extra strength tylenol, excedrin, vicodin, sudafed and nothing has helped.  Feels like severe hurting posterior headache with radiation down the neck at times.  No nausea or vomiting.  No numbness or weakness, no change in vision, trouble talking or walking.  No trauma, no fevers.  Not on blood thinners.  No history of headache.  Nothing seems to make it worse, including not bright lights or loud sounds. Nothing makes it better.   Past Medical History:  Diagnosis Date  . Arthritis   . Depression   . Hypertension   . PE (pulmonary embolism)    posssible 74  not able to prove    Patient Active Problem List   Diagnosis Date Noted  . Hyperlipidemia 12/12/2013  . Essential hypertension, benign 12/03/2013  . Depressive disorder, not elsewhere classified 12/03/2013  . Acute posthemorrhagic anemia 12/03/2013  . Right knee DJD 11/26/2013    Past Surgical History:  Procedure Laterality Date  . BACK SURGERY  85  . CATARACT EXTRACTION    . TOTAL KNEE ARTHROPLASTY Right 11/26/2013   DR DALLDORF  . TOTAL KNEE ARTHROPLASTY Right 11/26/2013   Procedure: TOTAL KNEE ARTHROPLASTY;  Surgeon: [name], MD;  Location: [facility];  Service: Orthopedics;  Laterality: Right;  Right total knee arthroplasty    OB History    No data available       Home Medications    Prior to Admission medications   Medication Sig Start Date End Date Taking? Authorizing Provider  acetaminophen (TYLENOL) 500 MG tablet Take 1,000 mg by mouth daily as needed for mild pain.   Yes  [provider]  buPROPion (WELLBUTRIN XL) 150 MG 24 hr tablet Take 150 mg by mouth daily.   Yes [provider]  Cholecalciferol (VITAMIN D PO) Take 1 tablet by mouth daily.   Yes [provider]  DULoxetine (CYMBALTA) 60 MG capsule Take 60 mg by mouth daily.   Yes [provider]  HYDROcodone-acetaminophen (NORCO/VICODIN) 5-325 MG tablet Take 1 tablet by mouth every 4 (four) hours as needed (pain).   Yes [provider]  Multiple Vitamin (MULTIVITAMIN WITH MINERALS) TABS tablet Take 1 tablet by mouth daily.   Yes [provider]  triamterene-hydrochlorothiazide (MAXZIDE-25) 37.5-25 MG per tablet Take 1 tablet by mouth daily.   Yes [provider]  aspirin EC 325 MG EC tablet Take 1 tablet (325 mg total) by mouth 2 (two) times daily after a meal. Patient not taking: Reported on 06/11/2017 11/29/13   Loni Dolly, PA-C  HYDROcodone-acetaminophen (NORCO) 10-325 MG per tablet Take 1 tablet by mouth every 4 hours as needed for pain 1-5 **DO NOT EXCEED 4 GM OF TYLENOL IN 24 HOURS** Take 2 tablets by mouth every 4 hours as needed for pain 6-10. Patient not taking: Reported on 06/11/2017 12/02/13   Hollace Kinnier L, DO  methocarbamol (ROBAXIN) 500 MG tablet Take 1 tablet (500 mg total) by mouth every 6 (six) hours as needed for muscle spasms. Patient not taking:  Reported on 06/11/2017 11/29/13   Loni Dolly, PA-C    Family History No family history on file.  Social History Social History   Tobacco Use  . Smoking status: Former Smoker    Packs/day: 1.00    Years: 25.00    Pack years: 25.00    Types: Cigarettes    Last attempt to quit: 11/19/2000    Years since quitting: 16.5  . Smokeless tobacco: Never Used  Substance Use Topics  . Alcohol use: Yes    Alcohol/week: 7.2 oz    Types: 8 Glasses of wine, 4 Cans of beer per week  . Drug use: No     Allergies   Penicillins   Review of Systems Review of Systems  Constitutional:  Negative for fever.  HENT: Negative for sore throat.   Eyes: Negative for visual disturbance.  Respiratory: Negative for cough and shortness of breath.   Cardiovascular: Negative for chest pain.  Gastrointestinal: Negative for abdominal pain, nausea and vomiting.  Genitourinary: Negative for difficulty urinating.  Musculoskeletal: Negative for back pain.  Skin: Negative for rash.  Neurological: Positive for headaches. Negative for dizziness, syncope, weakness and numbness.     Physical Exam Updated Vital Signs BP (!) 159/84   Pulse 79   Temp 97.7 F (36.5 C) (Oral)   Resp 16   Ht 5\' 6"  (1.676 m)   Wt 90.7 kg (200 lb)   SpO2 94%   BMI 32.28 kg/m   Physical Exam  Constitutional: She is oriented to person, place, and time. She appears well-developed and well-nourished. She appears distressed (pain).  HENT:  Head: Normocephalic and atraumatic.  Eyes: Conjunctivae and EOM are normal.  Neck: Normal range of motion.  Cardiovascular: Normal rate, regular rhythm, normal heart sounds and intact distal pulses. Exam reveals no gallop and no friction rub.  No murmur heard. Pulmonary/Chest: Effort normal and breath sounds normal. No respiratory distress. She has no wheezes. She has no rales.  Abdominal: Soft. She exhibits no distension. There is no tenderness. There is no guarding.  Musculoskeletal: She exhibits no edema or tenderness.  Neurological: She is alert and oriented to person, place, and time. She has normal strength. No cranial nerve deficit or sensory deficit. Coordination normal. GCS eye subscore is 4. GCS verbal subscore is 5. GCS motor subscore is 6.  Skin: Skin is warm and dry. No rash noted. She is not diaphoretic. No erythema.  Nursing note and vitals reviewed.    ED Treatments / Results  Labs (all labs ordered are listed, but only abnormal results are displayed) Labs Reviewed - No data to display  EKG  EKG Interpretation None       Radiology Ct Head Wo  Contrast  Result Date: 06/11/2017 CLINICAL DATA:  Severe right occipital headache since Thursday. No trauma. EXAM: CT HEAD WITHOUT CONTRAST TECHNIQUE: Contiguous axial images were obtained from the base of the skull through the vertex without intravenous contrast. COMPARISON:  None. FINDINGS: Brain: No evidence of acute infarction, hemorrhage, hydrocephalus, extra-axial collection or mass lesion/mass effect. Vascular: No hyperdense vessel or unexpected calcification. Skull: Normal. Negative for fracture or focal lesion. Sinuses/Orbits: No acute finding. Other: None. IMPRESSION: No acute intracranial abnormalities. Electronically Signed   By: Lucienne Capers M.D.   On: 06/11/2017 22:00    Procedures Procedures (including critical care time)  Medications Ordered in ED Medications  sodium chloride 0.9 % bolus 1,000 mL (0 mLs Intravenous Stopped 06/11/17 2259)  prochlorperazine (COMPAZINE) injection 10 mg (10 mg Intravenous  Given 06/11/17 2051)  diphenhydrAMINE (BENADRYL) injection 25 mg (25 mg Intravenous Given 06/11/17 2051)  haloperidol lactate (HALDOL) injection 2 mg (2 mg Intravenous Given 06/11/17 2141)  magnesium sulfate IVPB 2 g 50 mL (0 g Intravenous Stopped 06/11/17 2202)  diazepam (VALIUM) injection 5 mg (5 mg Intravenous Given 06/11/17 2327)     Initial Impression / Assessment and Plan / ED Course  I have reviewed the triage vital signs and the nursing notes.  Pertinent labs & imaging results that were available during my care of the patient were reviewed by me and considered in my medical decision making (see chart for details).     71 year old female with a history of hypertension, hyperlipidemia presents with concern for severe posterior headache beginning on Thursday.  CT had done shows no acute abnormalities.  Overall, low suspicion for subarachnoid hemorrhage given slower onset of symptoms.  Patient with continuing severe headache with radiation into the neck after receiving  Compazine and Benadryl, Haldol, magnesium.  No eye symptoms, doubt glaucoma. Does not describe CO risk, no cheek rosiness, with days of symptoms and overall doubt CO poisoning.  No fevers to suggest meningitis.  Given persistence of headache and age, will evaluate for other pathology including possible dural venous thrombosis with MRI and MRV.  Testing pending at time of transfer of care.  Give patient additional Valium for headache relief.  If MRI negative, recommend follow-up with primary care physician, may consider compazine for pain.  Final Clinical Impressions(s) / ED Diagnoses   Final diagnoses:  Severe headache    ED Discharge Orders    None       Gareth Morgan, MD 06/12/17 [phone]

## 2017-06-11 NOTE — ED Notes (Signed)
ED Provider at bedside. 

## 2017-06-11 NOTE — ED Notes (Signed)
Attempted PIVx1. Second RN to attempt.

## 2017-06-11 NOTE — ED Notes (Signed)
Patient transported to CT 

## 2017-06-11 NOTE — ED Triage Notes (Addendum)
C/o headache (back of head) x 4 days.  States pain occasionally moves down into neck.  Denies nausea and light sensitivity.  Denies fever, weakness, and dizziness.  Took Vicodin without any relief.

## 2017-06-12 ENCOUNTER — Emergency Department (HOSPITAL_COMMUNITY): Payer: Medicare Other

## 2017-06-12 DIAGNOSIS — R51 Headache: Secondary | ICD-10-CM | POA: Diagnosis not present

## 2017-06-12 MED ORDER — MORPHINE SULFATE (PF) 4 MG/ML IV SOLN
4.0000 mg | Freq: Once | INTRAVENOUS | Status: AC
  Administered 2017-06-12: 4 mg via INTRAVENOUS
  Filled 2017-06-12: qty 1

## 2017-06-12 MED ORDER — ONDANSETRON HCL 4 MG/2ML IJ SOLN
4.0000 mg | Freq: Once | INTRAMUSCULAR | Status: AC
  Administered 2017-06-12: 4 mg via INTRAVENOUS
  Filled 2017-06-12: qty 2

## 2017-06-12 MED ORDER — HYDROCODONE-ACETAMINOPHEN 5-325 MG PO TABS: 1.0000 | ORAL_TABLET | ORAL | 0 refills | Status: DC | PRN

## 2017-06-12 NOTE — ED Notes (Signed)
Patient transported to MRI 

## 2017-06-12 NOTE — ED Notes (Signed)
The pt is drowsy but she reports that for the first time her headache is finally gone

## 2017-06-12 NOTE — ED Provider Notes (Signed)
Patient was severe headache signed out to me pending results of MRI of the brain and MRV of the brain.  He is to come back with no acute process identified-nothing to explain her headache.  I went back to explain these results the patient.  She is awake and alert and in no distress, but states she is still having a severe headache.  She will be given intravenous morphine to try to get pain control achieved.  Headache is much better after single dose of morphine.  She is discharged with prescription for hydrocodone-acetaminophen, and is referred back to her PCP for further workup.  Consideration may need to be given to referral to neurology.  Results for orders placed or performed during the hospital encounter of 11/26/13  CBC  Result Value Ref Range   WBC 9.0 4.0 - 10.5 K/uL   RBC 4.02 3.87 - 5.11 MIL/uL   Hemoglobin 12.9 12.0 - 15.0 g/dL   HCT 39.0 36.0 - 46.0 %   MCV 97.0 78.0 - 100.0 fL   MCH 32.1 26.0 - 34.0 pg   MCHC 33.1 30.0 - 36.0 g/dL   RDW 13.3 11.5 - 15.5 %   Platelets 213 150 - 400 K/uL  Basic metabolic panel  Result Value Ref Range   Sodium 138 137 - 147 mEq/L   Potassium 4.0 3.7 - 5.3 mEq/L   Chloride 99 96 - 112 mEq/L   CO2 25 19 - 32 mEq/L   Glucose, Bld 144 (H) 70 - 99 mg/dL   BUN 20 6 - 23 mg/dL   Creatinine, Ser 1.03 0.50 - 1.10 mg/dL   Calcium 9.1 8.4 - 10.5 mg/dL   GFR calc non Af Amer 56 (L) >90 mL/min   GFR calc Af Amer 65 (L) >90 mL/min  CBC  Result Value Ref Range   WBC 8.4 4.0 - 10.5 K/uL   RBC 3.89 3.87 - 5.11 MIL/uL   Hemoglobin 12.6 12.0 - 15.0 g/dL   HCT 38.0 36.0 - 46.0 %   MCV 97.7 78.0 - 100.0 fL   MCH 32.4 26.0 - 34.0 pg   MCHC 33.2 30.0 - 36.0 g/dL   RDW 13.6 11.5 - 15.5 %   Platelets 186 150 - 400 K/uL  CBC  Result Value Ref Range   WBC 7.5 4.0 - 10.5 K/uL   RBC 3.62 (L) 3.87 - 5.11 MIL/uL   Hemoglobin 11.7 (L) 12.0 - 15.0 g/dL   HCT 35.4 (L) 36.0 - 46.0 %   MCV 97.8 78.0 - 100.0 fL   MCH 32.3 26.0 - 34.0 pg   MCHC 33.1 30.0 - 36.0  g/dL   RDW 13.4 11.5 - 15.5 %   Platelets 183 150 - 400 K/uL   Ct Head Wo Contrast  Result Date: 06/11/2017 CLINICAL DATA:  Severe right occipital headache since Thursday. No trauma. EXAM: CT HEAD WITHOUT CONTRAST TECHNIQUE: Contiguous axial images were obtained from the base of the skull through the vertex without intravenous contrast. COMPARISON:  None. FINDINGS: Brain: No evidence of acute infarction, hemorrhage, hydrocephalus, extra-axial collection or mass lesion/mass effect. Vascular: No hyperdense vessel or unexpected calcification. Skull: Normal. Negative for fracture or focal lesion. Sinuses/Orbits: No acute finding. Other: None. IMPRESSION: No acute intracranial abnormalities. Electronically Signed   By: Lucienne Capers M.D.   On: 06/11/2017 22:00   Mr Brain Wo Contrast  Result Date: 06/12/2017 CLINICAL DATA:  Thunderclap headache EXAM: MRI HEAD WITHOUT CONTRAST MRV HEAD WITHOUT CONTRAST TECHNIQUE: Multiplanar, multiecho pulse sequences of  the brain and surrounding structures were obtained without intravenous contrast. Angiographic images of the intracranial venous structures were obtained using MRV technique without intravenous contrast. COMPARISON:  Head CT 06/11/2017 FINDINGS: MRI BRAIN FINDINGS Brain: Partially calcified pineal gland, which is very common. The other midline structures are normal. There is no acute infarct or acute hemorrhage. No mass lesion, hydrocephalus, dural abnormality or extra-axial collection. Enlarged perivascular space of the right lentiform nucleus. There is multifocal hyperintense T2-weighted signal within the periventricular and subcortical white matter, which may be seen in the setting of migraine headaches or early chronic microvascular disease; however, it is also seen in normal patients of this age. No age-advanced or lobar predominant atrophy. No chronic microhemorrhage or superficial siderosis. Vascular: Major intracranial arterial flow voids are  preserved. Skull and upper cervical spine: Moderate hypertrophy of the superior aspect of the dens without stenosis. Normal calvarium. Normal skullbase. Sinuses/Orbits: No fluid levels or advanced mucosal thickening. No mastoid or middle ear effusion. Normal orbits. MRV FINDINGS Superior sagittal sinus: Normal. Straight sinus: Normal. Inferior sagittal sinus, vein of Galen and internal cerebral veins: Normal. Transverse sinuses: Diminished flow related enhancement within the proximal left transverse sinus. Normal right. Sigmoid sinuses: Diminished flow related enhancement of the left transverse/ sigmoid junction. Normal on the right. Visualized jugular veins: Normal. IMPRESSION: 1. No acute intracranial abnormality. 2. Nonspecific scattered foci of white matter hyperintensity. Though this may be seen in the setting of migraine headaches or vasculopathy such as early chronic small vessel disease, it is also seen in normal patients of this age group. 3. Diminished flow related enhancement within the proximal left transverse sinus and at the junction of the left transverse and sigmoid sinuses. This may be artifactual or due to congenital variation. CT venography is more capable of definitively excluding dural venous sinus thrombosis. Electronically Signed   By: Ulyses Jarred M.D.   On: 06/12/2017 02:01   Mr Mrv Head Wo Cm  Result Date: 06/12/2017 CLINICAL DATA:  Thunderclap headache EXAM: MRI HEAD WITHOUT CONTRAST MRV HEAD WITHOUT CONTRAST TECHNIQUE: Multiplanar, multiecho pulse sequences of the brain and surrounding structures were obtained without intravenous contrast. Angiographic images of the intracranial venous structures were obtained using MRV technique without intravenous contrast. COMPARISON:  Head CT 06/11/2017 FINDINGS: MRI BRAIN FINDINGS Brain: Partially calcified pineal gland, which is very common. The other midline structures are normal. There is no acute infarct or acute hemorrhage. No mass lesion,  hydrocephalus, dural abnormality or extra-axial collection. Enlarged perivascular space of the right lentiform nucleus. There is multifocal hyperintense T2-weighted signal within the periventricular and subcortical white matter, which may be seen in the setting of migraine headaches or early chronic microvascular disease; however, it is also seen in normal patients of this age. No age-advanced or lobar predominant atrophy. No chronic microhemorrhage or superficial siderosis. Vascular: Major intracranial arterial flow voids are preserved. Skull and upper cervical spine: Moderate hypertrophy of the superior aspect of the dens without stenosis. Normal calvarium. Normal skullbase. Sinuses/Orbits: No fluid levels or advanced mucosal thickening. No mastoid or middle ear effusion. Normal orbits. MRV FINDINGS Superior sagittal sinus: Normal. Straight sinus: Normal. Inferior sagittal sinus, vein of Galen and internal cerebral veins: Normal. Transverse sinuses: Diminished flow related enhancement within the proximal left transverse sinus. Normal right. Sigmoid sinuses: Diminished flow related enhancement of the left transverse/ sigmoid junction. Normal on the right. Visualized jugular veins: Normal. IMPRESSION: 1. No acute intracranial abnormality. 2. Nonspecific scattered foci of white matter hyperintensity. Though this may be seen  in the setting of migraine headaches or vasculopathy such as early chronic small vessel disease, it is also seen in normal patients of this age group. 3. Diminished flow related enhancement within the proximal left transverse sinus and at the junction of the left transverse and sigmoid sinuses. This may be artifactual or due to congenital variation. CT venography is more capable of definitively excluding dural venous sinus thrombosis. Electronically Signed   By: Ulyses Jarred M.D.   On: 55/97/4163 84:53      Delora Fuel, MD 64/68/03 [phone]

## 2017-06-12 NOTE — Discharge Instructions (Signed)
Your CT scan and MRI scans did not show a cause for your headache. Talk with your doctor about possible referral to a neurologist for further evaluation. Return if your headache is getting worse.

## 2017-06-22 DIAGNOSIS — M25551 Pain in right hip: Secondary | ICD-10-CM | POA: Diagnosis not present

## 2017-06-22 DIAGNOSIS — G894 Chronic pain syndrome: Secondary | ICD-10-CM | POA: Diagnosis not present

## 2017-06-22 DIAGNOSIS — G6289 Other specified polyneuropathies: Secondary | ICD-10-CM | POA: Diagnosis not present

## 2017-06-22 DIAGNOSIS — M25561 Pain in right knee: Secondary | ICD-10-CM | POA: Diagnosis not present

## 2017-06-22 DIAGNOSIS — M542 Cervicalgia: Secondary | ICD-10-CM | POA: Diagnosis not present

## 2017-08-01 DIAGNOSIS — M170 Bilateral primary osteoarthritis of knee: Secondary | ICD-10-CM | POA: Diagnosis not present

## 2017-08-01 DIAGNOSIS — G894 Chronic pain syndrome: Secondary | ICD-10-CM | POA: Diagnosis not present

## 2017-08-23 DIAGNOSIS — L409 Psoriasis, unspecified: Secondary | ICD-10-CM | POA: Diagnosis not present

## 2017-08-23 DIAGNOSIS — M150 Primary generalized (osteo)arthritis: Secondary | ICD-10-CM | POA: Diagnosis not present

## 2017-08-23 DIAGNOSIS — Z6832 Body mass index (BMI) 32.0-32.9, adult: Secondary | ICD-10-CM | POA: Diagnosis not present

## 2017-08-23 DIAGNOSIS — E669 Obesity, unspecified: Secondary | ICD-10-CM | POA: Diagnosis not present

## 2017-08-23 DIAGNOSIS — M2550 Pain in unspecified joint: Secondary | ICD-10-CM | POA: Diagnosis not present

## 2017-09-04 DIAGNOSIS — M1611 Unilateral primary osteoarthritis, right hip: Secondary | ICD-10-CM | POA: Diagnosis not present

## 2017-09-04 DIAGNOSIS — M1712 Unilateral primary osteoarthritis, left knee: Secondary | ICD-10-CM | POA: Diagnosis not present

## 2017-10-09 DIAGNOSIS — M179 Osteoarthritis of knee, unspecified: Secondary | ICD-10-CM | POA: Diagnosis not present

## 2017-10-09 DIAGNOSIS — G894 Chronic pain syndrome: Secondary | ICD-10-CM | POA: Diagnosis not present

## 2017-10-09 DIAGNOSIS — J029 Acute pharyngitis, unspecified: Secondary | ICD-10-CM | POA: Diagnosis not present

## 2017-10-30 DIAGNOSIS — L821 Other seborrheic keratosis: Secondary | ICD-10-CM | POA: Diagnosis not present

## 2017-10-30 DIAGNOSIS — D2272 Melanocytic nevi of left lower limb, including hip: Secondary | ICD-10-CM | POA: Diagnosis not present

## 2017-10-30 DIAGNOSIS — L814 Other melanin hyperpigmentation: Secondary | ICD-10-CM | POA: Diagnosis not present

## 2017-10-30 DIAGNOSIS — L409 Psoriasis, unspecified: Secondary | ICD-10-CM | POA: Diagnosis not present

## 2017-10-30 DIAGNOSIS — D692 Other nonthrombocytopenic purpura: Secondary | ICD-10-CM | POA: Diagnosis not present

## 2017-10-30 DIAGNOSIS — D225 Melanocytic nevi of trunk: Secondary | ICD-10-CM | POA: Diagnosis not present

## 2017-10-30 DIAGNOSIS — L570 Actinic keratosis: Secondary | ICD-10-CM | POA: Diagnosis not present

## 2017-10-30 DIAGNOSIS — D1801 Hemangioma of skin and subcutaneous tissue: Secondary | ICD-10-CM | POA: Diagnosis not present

## 2018-03-06 DIAGNOSIS — I10 Essential (primary) hypertension: Secondary | ICD-10-CM | POA: Diagnosis not present

## 2018-03-06 DIAGNOSIS — R946 Abnormal results of thyroid function studies: Secondary | ICD-10-CM | POA: Diagnosis not present

## 2018-03-06 DIAGNOSIS — F331 Major depressive disorder, recurrent, moderate: Secondary | ICD-10-CM | POA: Diagnosis not present

## 2018-03-06 DIAGNOSIS — G894 Chronic pain syndrome: Secondary | ICD-10-CM | POA: Diagnosis not present

## 2018-03-21 DIAGNOSIS — M25551 Pain in right hip: Secondary | ICD-10-CM | POA: Diagnosis not present

## 2018-03-21 DIAGNOSIS — M1611 Unilateral primary osteoarthritis, right hip: Secondary | ICD-10-CM | POA: Diagnosis not present

## 2018-03-25 DIAGNOSIS — Z23 Encounter for immunization: Secondary | ICD-10-CM | POA: Diagnosis not present

## 2018-05-21 DIAGNOSIS — Z1231 Encounter for screening mammogram for malignant neoplasm of breast: Secondary | ICD-10-CM | POA: Diagnosis not present

## 2018-06-11 DIAGNOSIS — R946 Abnormal results of thyroid function studies: Secondary | ICD-10-CM | POA: Diagnosis not present

## 2018-06-11 DIAGNOSIS — I10 Essential (primary) hypertension: Secondary | ICD-10-CM | POA: Diagnosis not present

## 2018-06-11 DIAGNOSIS — F339 Major depressive disorder, recurrent, unspecified: Secondary | ICD-10-CM | POA: Diagnosis not present

## 2018-06-11 DIAGNOSIS — Z1211 Encounter for screening for malignant neoplasm of colon: Secondary | ICD-10-CM | POA: Diagnosis not present

## 2018-06-11 DIAGNOSIS — G894 Chronic pain syndrome: Secondary | ICD-10-CM | POA: Diagnosis not present

## 2018-08-20 ENCOUNTER — Encounter: Payer: Self-pay | Admitting: Internal Medicine

## 2018-09-11 DIAGNOSIS — F339 Major depressive disorder, recurrent, unspecified: Secondary | ICD-10-CM | POA: Diagnosis not present

## 2018-09-11 DIAGNOSIS — M179 Osteoarthritis of knee, unspecified: Secondary | ICD-10-CM | POA: Diagnosis not present

## 2018-09-11 DIAGNOSIS — G894 Chronic pain syndrome: Secondary | ICD-10-CM | POA: Diagnosis not present

## 2018-09-11 DIAGNOSIS — G629 Polyneuropathy, unspecified: Secondary | ICD-10-CM | POA: Diagnosis not present

## 2018-09-11 DIAGNOSIS — I10 Essential (primary) hypertension: Secondary | ICD-10-CM | POA: Diagnosis not present

## 2018-09-17 ENCOUNTER — Ambulatory Visit (AMBULATORY_SURGERY_CENTER): Payer: Self-pay | Admitting: *Deleted

## 2018-09-17 VITALS — Ht 66.0 in | Wt 190.0 lb

## 2018-09-17 DIAGNOSIS — Z86010 Personal history of colonic polyps: Secondary | ICD-10-CM

## 2018-09-17 MED ORDER — NA SULFATE-K SULFATE-MG SULF 17.5-3.13-1.6 GM/177ML PO SOLN: ORAL | 0 refills | Status: DC

## 2018-09-17 NOTE — Progress Notes (Signed)
Patient denies any allergies to eggs or soy. Patient denies any problems with anesthesia/sedation. Patient denies any oxygen use at home. Patient denies taking any diet/weight loss medications or blood thinners. EMMI education offered, pt declined. suprep PNM coupon given to pt.

## 2018-10-01 ENCOUNTER — Other Ambulatory Visit: Payer: Self-pay

## 2018-10-01 ENCOUNTER — Ambulatory Visit (AMBULATORY_SURGERY_CENTER): Payer: Medicare Other | Admitting: Internal Medicine

## 2018-10-01 ENCOUNTER — Other Ambulatory Visit: Payer: Self-pay | Admitting: Internal Medicine

## 2018-10-01 ENCOUNTER — Encounter: Payer: Self-pay | Admitting: Internal Medicine

## 2018-10-01 VITALS — BP 112/67 | HR 88 | Temp 96.8°F | Resp 20 | Ht 66.0 in | Wt 190.0 lb

## 2018-10-01 DIAGNOSIS — D125 Benign neoplasm of sigmoid colon: Secondary | ICD-10-CM | POA: Diagnosis not present

## 2018-10-01 DIAGNOSIS — Z860100 Personal history of colon polyps, unspecified: Secondary | ICD-10-CM

## 2018-10-01 DIAGNOSIS — D124 Benign neoplasm of descending colon: Secondary | ICD-10-CM | POA: Diagnosis not present

## 2018-10-01 DIAGNOSIS — Z1211 Encounter for screening for malignant neoplasm of colon: Secondary | ICD-10-CM | POA: Diagnosis not present

## 2018-10-01 MED ORDER — SODIUM CHLORIDE 0.9 % IV SOLN: 500.0000 mL | Freq: Once | INTRAVENOUS | Status: DC

## 2018-10-01 NOTE — Progress Notes (Signed)
A and O x3. Report to RN. Tolerated MAC anesthesia well.

## 2018-10-01 NOTE — Progress Notes (Signed)
Pt's states no medical or surgical changes since previsit or office visit. 

## 2018-10-01 NOTE — Progress Notes (Signed)
Pt continued to cough through recovery time.  Non-productive cough.  When pt was awake she tolerated po fluids well.  Took pt's temp in the recovery room before discharge 97.8.  I advised her if she has temp 100 or above to call the number on her AVS.  Pt states she will.  No complaints noted on discharge. maw

## 2018-10-01 NOTE — Patient Instructions (Signed)
YOU HAD AN ENDOSCOPIC PROCEDURE TODAY AT [facility]:   Refer to the procedure report that was given to you for any specific questions about what was found during the examination.  If the procedure report does not answer your questions, please call your gastroenterologist to clarify.  If you requested that your care partner not be given the details of your procedure findings, then the procedure report has been included in a sealed envelope for you to review at your convenience later.  YOU SHOULD EXPECT: Some feelings of bloating in the abdomen. Passage of more gas than usual.  Walking can help get rid of the air that was put into your GI tract during the procedure and reduce the bloating. If you had a lower endoscopy (such as a colonoscopy or flexible sigmoidoscopy) you may notice spotting of blood in your stool or on the toilet paper. If you underwent a bowel prep for your procedure, you may not have a normal bowel movement for a few days.  Please Note:  You might notice some irritation and congestion in your nose or some drainage.  This is from the oxygen used during your procedure.  There is no need for concern and it should clear up in a day or so.  SYMPTOMS TO REPORT IMMEDIATELY:   Following lower endoscopy (colonoscopy or flexible sigmoidoscopy):  Excessive amounts of blood in the stool  Significant tenderness or worsening of abdominal pains  Swelling of the abdomen that is new, acute  Fever of 100F or higher   For urgent or emergent issues, a gastroenterologist can be reached at any hour by calling [phone].   DIET:  We do recommend a small meal at first, but then you may proceed to your regular diet.  Drink plenty of fluids but you should avoid alcoholic beverages for 24 hours.  ACTIVITY:  You should plan to take it easy for the rest of today and you should NOT DRIVE or use heavy machinery until tomorrow (because of the sedation medicines used during the test).     FOLLOW UP: Our staff will call the number listed on your records the next business day following your procedure to check on you and address any questions or concerns that you may have regarding the information given to you following your procedure. If we do not reach you, we will leave a message.  However, if you are feeling well and you are not experiencing any problems, there is no need to return our call.  We will assume that you have returned to your regular daily activities without incident.  If any biopsies were taken you will be contacted by phone or by letter within the next 1-3 weeks.  Please call us at [phone] if you have not heard about the biopsies in 3 weeks.    SIGNATURES/CONFIDENTIALITY: You and/or your care partner have signed paperwork which will be entered into your electronic medical record.  These signatures attest to the fact that that the information above on your After Visit Summary has been reviewed and is understood.  Full responsibility of the confidentiality of this discharge information lies with you and/or your care-partner.    Handouts were given to your care partner on polyps and diverticulosis. You may resume your current medications today. Await biopsy results. Please call if any questions or concerns.

## 2018-10-01 NOTE — Op Note (Signed)
[place] Patient Name: Gina Petty Procedure Date: 10/01/2018 10:03 AM MRN: [id number] Endoscopist: Docia Chuck. Henrene Pastor , MD Age: 73 Referring MD:  Date of Birth: [birth year] Gender: Female Account #: [id number] Procedure:                Colonoscopy with cold snare polypectomy x 2 Indications:              High risk colon cancer surveillance: Personal                            history of non-advanced adenomas. Previous                            examinations 1997, 2000, 2007 Medicines:                Monitored Anesthesia Care Procedure:                Pre-Anesthesia Assessment:                           - Prior to the procedure, a History and Physical                            was performed, and patient medications and                            allergies were reviewed. The patient's tolerance of                            previous anesthesia was also reviewed. The risks                            and benefits of the procedure and the sedation                            options and risks were discussed with the patient.                            All questions were answered, and informed consent                            was obtained. Prior Anticoagulants: The patient has                            taken no previous anticoagulant or antiplatelet                            agents. ASA Grade Assessment: II - A patient with                            mild systemic disease. After reviewing the risks                            and benefits, the patient was deemed in  satisfactory condition to undergo the procedure.                           After obtaining informed consent, the colonoscope                            was passed under direct vision. Throughout the                            procedure, the patient's blood pressure, pulse, and                            oxygen saturations were monitored continuously. The                            Colonoscope  was introduced through the anus and                            advanced to the the cecum, identified by                            appendiceal orifice and ileocecal valve. The                            ileocecal valve, appendiceal orifice, and rectum                            were photographed. The quality of the bowel                            preparation was good. The colonoscopy was performed                            without difficulty. The patient tolerated the                            procedure well. The bowel preparation used was                            SUPREP. Scope In: 10:23:42 AM Scope Out: 10:35:47 AM Scope Withdrawal Time: 0 hours 9 minutes 22 seconds  Total Procedure Duration: 0 hours 12 minutes 5 seconds  Findings:                 Two polyps were found in the sigmoid colon and                            descending colon. The polyps were 1 to 3 mm in                            size. These polyps were removed with a cold snare.                            Resection and retrieval were complete.  Multiple diverticula were found in the left colon.                           The exam was otherwise without abnormality on                            direct and retroflexion views. Complications:            No immediate complications. Estimated blood loss:                            None. Estimated Blood Loss:     Estimated blood loss: none. Impression:               - Two 1 to 3 mm polyps in the sigmoid colon and in                            the descending colon, removed with a cold snare.                            Resected and retrieved.                           - Diverticulosis in the left colon.                           - The examination was otherwise normal on direct                            and retroflexion views. Recommendation:           - Repeat colonoscopy is not recommended for                            surveillance.                            - Patient has a contact number available for                            emergencies. The signs and symptoms of potential                            delayed complications were discussed with the                            patient. Return to normal activities tomorrow.                            Written discharge instructions were provided to the                            patient.                           - Resume previous diet.                           -  Continue present medications.                           - Await pathology results. Docia Chuck. Henrene Pastor, MD 10/01/2018 10:50:04 AM This report has been signed electronically.

## 2018-10-02 ENCOUNTER — Telehealth: Payer: Self-pay | Admitting: *Deleted

## 2018-10-02 NOTE — Telephone Encounter (Signed)
  Follow up Call-  Call back number 10/01/2018  Post procedure Call Back phone  # [phone]  Permission to leave phone message Yes  Some recent data might be hidden     Patient questions:  Do you have a fever, pain , or abdominal swelling? No. Pain Score  0 *  Have you tolerated food without any problems? Yes.    Have you been able to return to your normal activities? Yes.    Do you have any questions about your discharge instructions: Diet   No. Medications  No. Follow up visit  No.  Do you have questions or concerns about your Care? No.  Actions: * If pain score is 4 or above: No action needed, pain <4.

## 2018-10-04 ENCOUNTER — Encounter: Payer: Self-pay | Admitting: Internal Medicine

## 2018-12-13 DIAGNOSIS — M170 Bilateral primary osteoarthritis of knee: Secondary | ICD-10-CM | POA: Diagnosis not present

## 2018-12-13 DIAGNOSIS — G894 Chronic pain syndrome: Secondary | ICD-10-CM | POA: Diagnosis not present

## 2018-12-13 DIAGNOSIS — F419 Anxiety disorder, unspecified: Secondary | ICD-10-CM | POA: Diagnosis not present

## 2018-12-13 DIAGNOSIS — F339 Major depressive disorder, recurrent, unspecified: Secondary | ICD-10-CM | POA: Diagnosis not present

## 2019-03-06 DIAGNOSIS — I10 Essential (primary) hypertension: Secondary | ICD-10-CM | POA: Diagnosis not present

## 2019-03-06 DIAGNOSIS — E785 Hyperlipidemia, unspecified: Secondary | ICD-10-CM | POA: Diagnosis not present

## 2019-03-06 DIAGNOSIS — F325 Major depressive disorder, single episode, in full remission: Secondary | ICD-10-CM | POA: Diagnosis not present

## 2019-03-06 DIAGNOSIS — G894 Chronic pain syndrome: Secondary | ICD-10-CM | POA: Diagnosis not present

## 2019-03-06 DIAGNOSIS — E559 Vitamin D deficiency, unspecified: Secondary | ICD-10-CM | POA: Diagnosis not present

## 2019-03-06 DIAGNOSIS — M170 Bilateral primary osteoarthritis of knee: Secondary | ICD-10-CM | POA: Diagnosis not present

## 2019-03-29 DIAGNOSIS — Z23 Encounter for immunization: Secondary | ICD-10-CM | POA: Diagnosis not present

## 2019-03-29 DIAGNOSIS — E785 Hyperlipidemia, unspecified: Secondary | ICD-10-CM | POA: Diagnosis not present

## 2019-03-29 DIAGNOSIS — E559 Vitamin D deficiency, unspecified: Secondary | ICD-10-CM | POA: Diagnosis not present

## 2019-03-29 DIAGNOSIS — I10 Essential (primary) hypertension: Secondary | ICD-10-CM | POA: Diagnosis not present

## 2019-04-15 DIAGNOSIS — Z96651 Presence of right artificial knee joint: Secondary | ICD-10-CM | POA: Diagnosis not present

## 2019-04-15 DIAGNOSIS — M65331 Trigger finger, right middle finger: Secondary | ICD-10-CM | POA: Diagnosis not present

## 2019-04-15 DIAGNOSIS — M25551 Pain in right hip: Secondary | ICD-10-CM | POA: Diagnosis not present

## 2019-04-15 DIAGNOSIS — M65332 Trigger finger, left middle finger: Secondary | ICD-10-CM | POA: Diagnosis not present

## 2019-04-24 ENCOUNTER — Other Ambulatory Visit: Payer: Self-pay | Admitting: Orthopaedic Surgery

## 2019-04-26 DIAGNOSIS — Z01818 Encounter for other preprocedural examination: Secondary | ICD-10-CM | POA: Diagnosis not present

## 2019-04-26 DIAGNOSIS — I10 Essential (primary) hypertension: Secondary | ICD-10-CM | POA: Diagnosis not present

## 2019-04-26 DIAGNOSIS — M1611 Unilateral primary osteoarthritis, right hip: Secondary | ICD-10-CM | POA: Diagnosis not present

## 2019-04-26 DIAGNOSIS — F331 Major depressive disorder, recurrent, moderate: Secondary | ICD-10-CM | POA: Diagnosis not present

## 2019-06-05 ENCOUNTER — Other Ambulatory Visit: Payer: Self-pay | Admitting: Orthopaedic Surgery

## 2019-06-05 NOTE — H&P (Signed)
TOTAL HIP ADMISSION H&P  Patient is admitted for right total hip arthroplasty.  Subjective:  Chief Complaint: right hip pain  HPI: Gina Petty, 73 y.o. female, has a history of pain and functional disability in the right hip(s) due to arthritis and patient has failed non-surgical conservative treatments for greater than 12 weeks to include NSAID's and/or analgesics, corticosteriod injections, flexibility and strengthening excercises, supervised PT with diminished ADL's post treatment, use of assistive devices, weight reduction as appropriate and activity modification.  Onset of symptoms was gradual starting 5 years ago with gradually worsening course since that time.The patient noted no past surgery on the right hip(s).  Patient currently rates pain in the right hip at 10 out of 10 with activity. Patient has night pain, worsening of pain with activity and weight bearing, trendelenberg gait, pain that interfers with activities of daily living and crepitus. Patient has evidence of subchondral cysts, subchondral sclerosis, periarticular osteophytes and joint space narrowing by imaging studies. This condition presents safety issues increasing the risk of falls. There is no current active infection.  Patient Active Problem List   Diagnosis Date Noted  . Hyperlipidemia 12/12/2013  . Essential hypertension, benign 12/03/2013  . Depressive disorder, not elsewhere classified 12/03/2013  . Acute posthemorrhagic anemia 12/03/2013  . 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 Arthritis   . Depression   . Hypertension   . PE (pulmonary embolism)    posssible 74  not able to prove    Past Surgical History:  Procedure Laterality Date  . BACK SURGERY  85  . CATARACT EXTRACTION    . COLONOSCOPY  06/12/2006 last   . POLYPECTOMY    . TOTAL KNEE ARTHROPLASTY Right 11/26/2013   DR DALLDORF  . TOTAL KNEE ARTHROPLASTY Right 11/26/2013   Procedure: TOTAL KNEE ARTHROPLASTY;   Surgeon: [name], MD;  Location: [facility];  Service: Orthopedics;  Laterality: Right;  Right total knee arthroplasty    No current facility-administered medications for this encounter.    Current Outpatient Medications  Medication Sig Dispense Refill Last Dose  . acetaminophen (TYLENOL) 500 MG tablet Take 1,000 mg by mouth every 6 (six) hours as needed for mild pain.      Marland Kitchen buPROPion (WELLBUTRIN XL) 150 MG 24 hr tablet Take 150 mg by mouth daily.     Marland Kitchen CANNABIDIOL PO Take 1 tablet by mouth daily as needed (pain.). CBD GUMMIES     . Cholecalciferol (VITAMIN D PO) Take 1 tablet by mouth daily.     . cyanocobalamin 2000 MCG tablet Take 2,000 mcg by mouth daily. Vitamin B12     . diphenhydramine-acetaminophen (TYLENOL PM) 25-500 MG TABS tablet Take 1 tablet by mouth at bedtime as needed (pain).      Marland Kitchen HYDROcodone-acetaminophen (NORCO/VICODIN) 5-325 MG tablet Take 1 tablet by mouth every 4 (four) hours as needed (pain). 10 tablet 0   . Multiple Vitamin (MULTIVITAMIN WITH MINERALS) TABS tablet Take 1 tablet by mouth daily.     Marland Kitchen triamterene-hydrochlorothiazide (MAXZIDE-25) 37.5-25 MG per tablet Take 1 tablet by mouth daily.      Allergies  Allergen Reactions  . Penicillins Rash    Has patient had a PCN reaction causing immediate rash, facial/tongue/throat swelling, SOB or lightheadedness with hypotension: Yes Has patient had a PCN reaction causing severe rash involving mucus membranes or skin necrosis: No Has patient had a PCN reaction that required hospitalization: No Has patient had a PCN reaction occurring  within the last 10 years: No If all of the above answers are "NO", then may proceed with Cephalosporin use.    Social History   Tobacco Use  . Smoking status: Former Smoker    Packs/day: 1.00    Years: 25.00    Pack years: 25.00    Types: Cigarettes    Quit date: 11/19/2000    Years since quitting: 18.5  . Smokeless tobacco: Never Used  Substance Use Topics  . Alcohol use: Yes     Alcohol/week: 7.0 standard drinks    Types: 7 Glasses of wine per week    Family History  Problem Relation Age of Onset  . Colon cancer Brother 65  . Colon polyps Neg Hx   . Esophageal cancer Neg Hx   . Prostate cancer Neg Hx   . Rectal cancer Neg Hx      Review of Systems  Musculoskeletal: Positive for joint pain.       Right hip  All other systems reviewed and are negative.   Objective:  Physical Exam  Constitutional: She is oriented to person, place, and time. She appears well-developed and well-nourished.  HENT:  Head: Normocephalic and atraumatic.  Eyes: Pupils are equal, round, and reactive to light.  Neck: Normal range of motion.  Cardiovascular: Normal rate and regular rhythm.  Respiratory: Effort normal.  GI: Soft.  Musculoskeletal:     Comments: Right hip motion is limited and extremely painful in internal rotation.  Leg lengths are roughly equal.  Her right knee moves 0-110 with good stability in extension.  She has some pain on her patella but no effusion and good stability.  Calf is soft and nontender.  Neurological: She is alert and oriented to person, place, and time.  Skin: Skin is warm and dry.  Psychiatric: She has a normal mood and affect. Her behavior is normal. Judgment and thought content normal.    Vital signs in last 24 hours: BP: ()/()  Arterial Line BP: ()/()   Labs:   Estimated body mass index is 30.67 kg/m as calculated from the following:   Height as of 10/01/18: 5\' 6"  (1.676 m).   Weight as of 10/01/18: 86.2 kg.   Imaging Review Plain radiographs demonstrate severe degenerative joint disease of the right hip(s). The bone quality appears to be good for age and reported activity level.      Assessment/Plan:  End stage primary arthritis, right hip(s)  The patient history, physical examination, clinical judgement of the provider and imaging studies are consistent with end stage degenerative joint disease of the right hip(s) and  total hip arthroplasty is deemed medically necessary. The treatment options including medical management, injection therapy, arthroscopy and arthroplasty were discussed at length. The risks and benefits of total hip arthroplasty were presented and reviewed. The risks due to aseptic loosening, infection, stiffness, dislocation/subluxation,  thromboembolic complications and other imponderables were discussed.  The patient acknowledged the explanation, agreed to proceed with the plan and consent was signed. Patient is being admitted for inpatient treatment for surgery, pain control, PT, OT, prophylactic antibiotics, VTE prophylaxis, progressive ambulation and ADL's and discharge planning.The patient is planning to be discharged home with home health services

## 2019-06-05 NOTE — Care Plan (Signed)
Spoke with patient prior to surgery. She plans to discharge to home with family and HHPT. Referral to [facility]. She has all needed equipment at home from previous surgery. She will transition to [place] after office follow up. Patient and MD in agreement with plan. Choice offered.    Ladell Heads, [place]

## 2019-06-11 ENCOUNTER — Ambulatory Visit: Admit: 2019-06-11 | Payer: Medicare Other | Admitting: Orthopaedic Surgery

## 2019-06-11 SURGERY — ARTHROPLASTY, HIP, TOTAL, ANTERIOR APPROACH
Anesthesia: Spinal | Site: Hip | Laterality: Right

## 2019-08-07 ENCOUNTER — Ambulatory Visit: Payer: Medicare Other | Attending: Internal Medicine

## 2019-08-07 DIAGNOSIS — F339 Major depressive disorder, recurrent, unspecified: Secondary | ICD-10-CM | POA: Diagnosis not present

## 2019-08-07 DIAGNOSIS — Z23 Encounter for immunization: Secondary | ICD-10-CM | POA: Insufficient documentation

## 2019-08-07 DIAGNOSIS — G894 Chronic pain syndrome: Secondary | ICD-10-CM | POA: Diagnosis not present

## 2019-08-07 DIAGNOSIS — I10 Essential (primary) hypertension: Secondary | ICD-10-CM | POA: Diagnosis not present

## 2019-08-07 NOTE — Progress Notes (Signed)
   Covid-19 Vaccination Clinic  Name:  Gina Petty    MRN: [id number] DOB: 10/23/1945  08/07/2019  Gina Petty was observed post Covid-19 immunization for 15 minutes without incidence. She was provided with Vaccine Information Sheet and instruction to access the V-Safe system.   Gina Petty was instructed to call 911 with any severe reactions post vaccine: Marland Kitchen Difficulty breathing  . Swelling of your face and throat  . A fast heartbeat  . A bad rash all over your body  . Dizziness and weakness    Immunizations Administered    Name Date Dose VIS Date Route   Pfizer COVID-19 Vaccine 08/07/2019  9:30 AM 0.3 mL 06/28/2019 Intramuscular   Manufacturer: [place]   Lot: [id number]   [place]: [id number]

## 2019-08-25 ENCOUNTER — Ambulatory Visit: Payer: Medicare Other | Attending: Internal Medicine

## 2019-08-25 DIAGNOSIS — Z23 Encounter for immunization: Secondary | ICD-10-CM | POA: Insufficient documentation

## 2019-08-25 NOTE — Progress Notes (Signed)
   Covid-19 Vaccination Clinic  Name:  Gina Petty    MRN: [id number] DOB: 07-Jul-1946  08/25/2019  [name] was observed post Covid-19 immunization for 15 minutes without incidence. She was provided with Vaccine Information Sheet and instruction to access the V-Safe system.   [name] was instructed to call 911 with any severe reactions post vaccine: Marland Kitchen Difficulty breathing  . Swelling of your face and throat  . A fast heartbeat  . A bad rash all over your body  . Dizziness and weakness    Immunizations Administered    Name Date Dose VIS Date Route   Pfizer COVID-19 Vaccine 08/25/2019  1:15 PM 0.3 mL 06/28/2019 Intramuscular   Manufacturer: [place]   Lot: [id number]   [place]: [id number]

## 2019-08-27 ENCOUNTER — Other Ambulatory Visit: Payer: Self-pay | Admitting: Orthopaedic Surgery

## 2019-09-27 NOTE — Patient Instructions (Signed)
DUE TO COVID-19 ONLY ONE VISITOR IS ALLOWED TO COME WITH YOU AND STAY IN THE WAITING ROOM ONLY DURING PRE OP AND PROCEDURE DAY OF SURGERY. THE 1 VISITOR MAY VISIT WITH YOU AFTER SURGERY IN YOUR PRIVATE ROOM DURING VISITING HOURS ONLY!  YOU NEED TO HAVE A COVID 19 TEST ON_______ @_______ , THIS TEST MUST BE DONE BEFORE SURGERY, COME  [place], [address] , [address].  ([place]) ONCE YOUR COVID TEST IS COMPLETED, PLEASE BEGIN THE QUARANTINE INSTRUCTIONS AS OUTLINED IN YOUR HANDOUT.                Emra Livshits Araque  09/27/2019   Your procedure is scheduled on: 10-08-19   Report to [facility] Main  Entrance    Report to Admitting at 7:35 AM     Call this number if you have problems the morning of surgery [phone]    Remember: NO SOLID FOOD AFTER MIDNIGHT THE NIGHT PRIOR TO SURGERY. NOTHING BY MOUTH EXCEPT CLEAR LIQUIDS UNTIL 7:05 AM. PLEASE FINISH ENSURE DRINK PER SURGEON ORDER  WHICH NEEDS TO BE COMPLETED AT 7:05 AM .     CLEAR LIQUID DIET   Foods Allowed                                                                     Foods Excluded  Coffee and tea, regular and decaf                             liquids that you cannot  Plain Jell-O any favor except red or purple                                           see through such as: Fruit ices (not with fruit pulp)                                     milk, soups, orange juice  Iced Popsicles                                    All solid food Carbonated beverages, regular and diet                                    Cranberry, grape and apple juices Sports drinks like Gatorade Lightly seasoned clear broth or consume(fat free) Sugar, honey syrup   _____________________________________________________________________      Take these medicines the morning of surgery with A SIP OF WATER: Bupropion (Wellbutrin)  BRUSH YOUR TEETH MORNING OF SURGERY AND RINSE YOUR MOUTH OUT, NO CHEWING GUM CANDY OR  MINTS.                                   You may not have any metal on your body  including hair pins and              piercings     Do not wear jewelry, make-up, lotions, powders or perfumes, deodorant              Do not wear nail polish on your fingernails.  Do not shave  48 hours prior to surgery.          Do not bring valuables to the hospital. [place].  Contacts, dentures or bridgework may not be worn into surgery.  Leave suitcase in the car. After surgery it may be brought to your room.     Patients discharged the day of surgery will not be allowed to drive home. IF YOU ARE HAVING SURGERY AND GOING HOME THE SAME DAY, YOU MUST HAVE AN ADULT TO DRIVE YOU HOME AND BE WITH YOU FOR 24 HOURS. YOU MAY GO HOME BY TAXI OR UBER OR ORTHERWISE, BUT AN ADULT MUST ACCOMPANY YOU HOME AND STAY WITH YOU FOR 24 HOURS.  Name and phone number of your driver:  Special Instructions: N/A              Please read over the following fact sheets you were given: _____________________________________________________________________             [facility] - Preparing for Surgery Before surgery, you can play an important role.  Because skin is not sterile, your skin needs to be as free of germs as possible.  You can reduce the number of germs on your skin by washing with CHG (chlorahexidine gluconate) soap before surgery.  CHG is an antiseptic cleaner which kills germs and bonds with the skin to continue killing germs even after washing. Please DO NOT use if you have an allergy to CHG or antibacterial soaps.  If your skin becomes reddened/irritated stop using the CHG and inform your nurse when you arrive at Short Stay. Do not shave (including legs and underarms) for at least 48 hours prior to the first CHG shower.  You may shave your face/neck. Please follow these instructions carefully:  1.  Shower with CHG Soap the night before surgery and the  morning  of Surgery.  2.  If you choose to wash your hair, wash your hair first as usual with your  normal  shampoo.  3.  After you shampoo, rinse your hair and body thoroughly to remove the  shampoo.                           4.  Use CHG as you would any other liquid soap.  You can apply chg directly  to the skin and wash                       Gently with a scrungie or clean washcloth.  5.  Apply the CHG Soap to your body ONLY FROM THE NECK DOWN.   Do not use on face/ open                           Wound or open sores. Avoid contact with eyes, ears mouth and genitals (private parts).                       Wash face,  Genitals (private parts) with your normal soap.             6.  Wash thoroughly, paying special attention to the area where your surgery  will be performed.  7.  Thoroughly rinse your body with warm water from the neck down.  8.  DO NOT shower/wash with your normal soap after using and rinsing off  the CHG Soap.                9.  Pat yourself dry with a clean towel.            10.  Wear clean pajamas.            11.  Place clean sheets on your bed the night of your first shower and do not  sleep with pets. Day of Surgery : Do not apply any lotions/deodorants the morning of surgery.  Please wear clean clothes to the hospital/surgery center.  FAILURE TO FOLLOW THESE INSTRUCTIONS MAY RESULT IN THE CANCELLATION OF YOUR SURGERY PATIENT SIGNATURE_________________________________  NURSE SIGNATURE__________________________________  ________________________________________________________________________   Adam Phenix  An incentive spirometer is a tool that can help keep your lungs clear and active. This tool measures how well you are filling your lungs with each breath. Taking long deep breaths may help reverse or decrease the chance of developing breathing (pulmonary) problems (especially infection) following:  A long period of time when you are unable to move or be  active. BEFORE THE PROCEDURE   If the spirometer includes an indicator to show your best effort, your nurse or respiratory therapist will set it to a desired goal.  If possible, sit up straight or lean slightly forward. Try not to slouch.  Hold the incentive spirometer in an upright position. INSTRUCTIONS FOR USE  1. Sit on the edge of your bed if possible, or sit up as far as you can in bed or on a chair. 2. Hold the incentive spirometer in an upright position. 3. Breathe out normally. 4. Place the mouthpiece in your mouth and seal your lips tightly around it. 5. Breathe in slowly and as deeply as possible, raising the piston or the ball toward the top of the column. 6. Hold your breath for 3-5 seconds or for as long as possible. Allow the piston or ball to fall to the bottom of the column. 7. Remove the mouthpiece from your mouth and breathe out normally. 8. Rest for a few seconds and repeat Steps 1 through 7 at least 10 times every 1-2 hours when you are awake. Take your time and take a few normal breaths between deep breaths. 9. The spirometer may include an indicator to show your best effort. Use the indicator as a goal to work toward during each repetition. 10. After each set of 10 deep breaths, practice coughing to be sure your lungs are clear. If you have an incision (the cut made at the time of surgery), support your incision when coughing by placing a pillow or rolled up towels firmly against it. Once you are able to get out of bed, walk around indoors and cough well. You may stop using the incentive spirometer when instructed by your caregiver.  RISKS AND COMPLICATIONS  Take your time so you do not get dizzy or light-headed.  If you are in pain, you may need to take or ask for pain medication before doing incentive spirometry. It is harder to take a deep breath if you are having pain. AFTER USE  Rest and breathe slowly and easily.  It can be helpful to keep track of a log of  your progress. Your caregiver can provide you with a simple table to help with this. If you are using the spirometer at home, follow these instructions: [place] IF:   You are having difficultly using the spirometer.  You have trouble using the spirometer as often as instructed.  Your pain medication is not giving enough relief while using the spirometer.  You develop fever of 100.5 F (38.1 C) or higher. SEEK IMMEDIATE MEDICAL CARE IF:   You cough up bloody sputum that had not been present before.  You develop fever of 102 F (38.9 C) or greater.  You develop worsening pain at or near the incision site. MAKE SURE YOU:   Understand these instructions.  Will watch your condition.  Will get help right away if you are not doing well or get worse. Document Released: 11/14/2006 Document Revised: 09/26/2011 Document Reviewed: 01/15/2007 ExitCare Patient Information 2014 ExitCare, [place].   ________________________________________________________________________  WHAT IS A BLOOD TRANSFUSION? Blood Transfusion Information  A transfusion is the replacement of blood or some of its parts. Blood is made up of multiple cells which provide different functions.  Red blood cells carry oxygen and are used for blood loss replacement.  White blood cells fight against infection.  Platelets control bleeding.  Plasma helps clot blood.  Other blood products are available for specialized needs, such as hemophilia or other clotting disorders. BEFORE THE TRANSFUSION  Who gives blood for transfusions?   Healthy volunteers who are fully evaluated to make sure their blood is safe. This is blood bank blood. Transfusion therapy is the safest it has ever been in the practice of medicine. Before blood is taken from a donor, a complete history is taken to make sure that person has no history of diseases nor engages in risky social behavior (examples are intravenous drug use or sexual activity  with multiple partners). The donor's travel history is screened to minimize risk of transmitting infections, such as malaria. The donated blood is tested for signs of infectious diseases, such as HIV and hepatitis. The blood is then tested to be sure it is compatible with you in order to minimize the chance of a transfusion reaction. If you or a relative donates blood, this is often done in anticipation of surgery and is not appropriate for emergency situations. It takes many days to process the donated blood. RISKS AND COMPLICATIONS Although transfusion therapy is very safe and saves many lives, the main dangers of transfusion include:   Getting an infectious disease.  Developing a transfusion reaction. This is an allergic reaction to something in the blood you were given. Every precaution is taken to prevent this. The decision to have a blood transfusion has been considered carefully by your caregiver before blood is given. Blood is not given unless the benefits outweigh the risks. AFTER THE TRANSFUSION  Right after receiving a blood transfusion, you will usually feel much better and more energetic. This is especially true if your red blood cells have gotten low (anemic). The transfusion raises the level of the red blood cells which carry oxygen, and this usually causes an energy increase.  The nurse administering the transfusion will monitor you carefully for complications. HOME CARE INSTRUCTIONS  No special instructions are needed after a transfusion. You may find your energy is better. Speak with your caregiver about any limitations on activity for underlying diseases you may have.  SEEK MEDICAL CARE IF:   Your condition is not improving after your transfusion.  You develop redness or irritation at the intravenous (IV) site. SEEK IMMEDIATE MEDICAL CARE IF:  Any of the following symptoms occur over the next 12 hours:  Shaking chills.  You have a temperature by mouth above 102 F (38.9  C), not controlled by medicine.  Chest, back, or muscle pain.  People around you feel you are not acting correctly or are confused.  Shortness of breath or difficulty breathing.  Dizziness and fainting.  You get a rash or develop hives.  You have a decrease in urine output.  Your urine turns a dark color or changes to pink, red, or brown. Any of the following symptoms occur over the next 10 days:  You have a temperature by mouth above 102 F (38.9 C), not controlled by medicine.  Shortness of breath.  Weakness after normal activity.  The white part of the eye turns yellow (jaundice).  You have a decrease in the amount of urine or are urinating less often.  Your urine turns a dark color or changes to pink, red, or brown. Document Released: 07/01/2000 Document Revised: 09/26/2011 Document Reviewed: 02/18/2008 [facility] Patient Information 2014 [place], [place].  _______________________________________________________________________

## 2019-09-27 NOTE — Progress Notes (Addendum)
PCP - Harlan Stains, MD Cardiologist -   Chest x-ray -  EKG - 04-26-19 on chart Stress Test -  ECHO -  Cardiac Cath -   Sleep Study -  CPAP -   Fasting Blood Sugar -  Checks Blood Sugar _____ times a day  Blood Thinner Instructions: Aspirin Instructions: Last Dose:  Anesthesia review:   Patient denies shortness of breath, fever, cough and chest pain at PAT appointment   Patient verbalized understanding of instructions that were given to them at the PAT appointment. Patient was also instructed that they will need to review over the PAT instructions again at home before surgery.

## 2019-09-30 ENCOUNTER — Other Ambulatory Visit: Payer: Self-pay

## 2019-09-30 ENCOUNTER — Encounter (HOSPITAL_COMMUNITY)
Admission: RE | Admit: 2019-09-30 | Discharge: 2019-09-30 | Disposition: A | Discharge status: Home / Self Care | Payer: Medicare Other | Source: Ambulatory Visit | Attending: Orthopaedic Surgery | Admitting: Orthopaedic Surgery

## 2019-09-30 ENCOUNTER — Encounter (HOSPITAL_COMMUNITY): Payer: Self-pay

## 2019-10-01 DIAGNOSIS — M1611 Unilateral primary osteoarthritis, right hip: Secondary | ICD-10-CM | POA: Diagnosis not present

## 2019-10-01 DIAGNOSIS — M25651 Stiffness of right hip, not elsewhere classified: Secondary | ICD-10-CM | POA: Diagnosis not present

## 2019-10-01 DIAGNOSIS — M6281 Muscle weakness (generalized): Secondary | ICD-10-CM | POA: Diagnosis not present

## 2019-10-02 ENCOUNTER — Other Ambulatory Visit: Payer: Self-pay

## 2019-10-02 ENCOUNTER — Ambulatory Visit (HOSPITAL_COMMUNITY)
Admission: RE | Admit: 2019-10-02 | Discharge: 2019-10-02 | Disposition: A | Discharge status: Home / Self Care | Payer: Medicare Other | Source: Ambulatory Visit | Attending: Orthopaedic Surgery | Admitting: Orthopaedic Surgery

## 2019-10-02 ENCOUNTER — Encounter (HOSPITAL_COMMUNITY)
Admission: RE | Admit: 2019-10-02 | Discharge: 2019-10-02 | Disposition: A | Discharge status: Home / Self Care | Payer: Medicare Other | Source: Ambulatory Visit | Attending: Orthopaedic Surgery | Admitting: Orthopaedic Surgery

## 2019-10-02 DIAGNOSIS — Z01818 Encounter for other preprocedural examination: Secondary | ICD-10-CM

## 2019-10-02 LAB — CBC WITH DIFFERENTIAL/PLATELET
Abs Immature Granulocytes: 0.01 10*3/uL (ref 0.00–0.07)
Basophils Absolute: 0 10*3/uL (ref 0.0–0.1)
Basophils Relative: 1 %
Eosinophils Absolute: 0.1 10*3/uL (ref 0.0–0.5)
Eosinophils Relative: 2 %
HCT: 45.3 % (ref 36.0–46.0)
Hemoglobin: 14.7 g/dL (ref 12.0–15.0)
Immature Granulocytes: 0 %
Lymphocytes Relative: 18 %
Lymphs Abs: 1 10*3/uL (ref 0.7–4.0)
MCH: 33 pg (ref 26.0–34.0)
MCHC: 32.5 g/dL (ref 30.0–36.0)
MCV: 101.6 fL — ABNORMAL HIGH (ref 80.0–100.0)
Monocytes Absolute: 0.7 10*3/uL (ref 0.1–1.0)
Monocytes Relative: 12 %
Neutro Abs: 3.7 10*3/uL (ref 1.7–7.7)
Neutrophils Relative %: 67 %
Platelets: 217 10*3/uL (ref 150–400)
RBC: 4.46 MIL/uL (ref 3.87–5.11)
RDW: 12.2 % (ref 11.5–15.5)
WBC: 5.5 10*3/uL (ref 4.0–10.5)
nRBC: 0 % (ref 0.0–0.2)

## 2019-10-02 LAB — BASIC METABOLIC PANEL
Anion gap: 9 (ref 5–15)
BUN: 22 mg/dL (ref 8–23)
CO2: 29 mmol/L (ref 22–32)
Calcium: 9.5 mg/dL (ref 8.9–10.3)
Chloride: 103 mmol/L (ref 98–111)
Creatinine, Ser: 0.96 mg/dL (ref 0.44–1.00)
GFR calc Af Amer: >60 mL/min (ref >60)
GFR calc non Af Amer: 60 mL/min — ABNORMAL LOW (ref >60)
Glucose, Bld: 120 mg/dL — ABNORMAL HIGH (ref 70–99)
Potassium: 4.8 mmol/L (ref 3.5–5.1)
Sodium: 141 mmol/L (ref 135–145)

## 2019-10-02 LAB — URINALYSIS, ROUTINE W REFLEX MICROSCOPIC
Bacteria, UA: NONE SEEN
Bilirubin Urine: NEGATIVE
Glucose, UA: NEGATIVE mg/dL
Hgb urine dipstick: NEGATIVE
Ketones, ur: NEGATIVE mg/dL
Nitrite: NEGATIVE
Protein, ur: NEGATIVE mg/dL
Specific Gravity, Urine: 1.011 (ref 1.005–1.030)
pH: 7 (ref 5.0–8.0)

## 2019-10-02 LAB — PROTIME-INR
INR: 1 (ref 0.8–1.2)
Prothrombin Time: 13.4 seconds (ref 11.4–15.2)

## 2019-10-02 LAB — SURGICAL PCR SCREEN
MRSA, PCR: NEGATIVE
Staphylococcus aureus: NEGATIVE

## 2019-10-02 LAB — APTT: aPTT: 24 seconds (ref 24–36)

## 2019-10-02 LAB — ABO/RH: ABO/RH(D): A NEG

## 2019-10-02 NOTE — Care Plan (Signed)
Ortho Bundle Case Management Note  Patient Details  Name: Gina Petty MRN: [id number] Date of Birth: 02-18-1946   Spoke with patient prior to surgery. She will discharge to home with family to assist. Rolling walker ordered for home. HHPT referral to [facility] and OPPT set up with [place]. Patient and MD in agreement with plan. Choice offered                   DME Arranged:  Walker rolling DME Agency:  [employer]  HH Arranged:  PT [place] Agency:  [facility] (formerly [facility])  Additional Comments: Please contact me with any questions of if this plan should need to change.  Ladell Heads,  [place] [facility] Specialist  [phone] 10/02/2019, 12:31 PM

## 2019-10-04 ENCOUNTER — Other Ambulatory Visit (HOSPITAL_COMMUNITY)
Admission: RE | Admit: 2019-10-04 | Discharge: 2019-10-04 | Disposition: A | Discharge status: Home / Self Care | Payer: Medicare Other | Source: Ambulatory Visit | Attending: Orthopaedic Surgery | Admitting: Orthopaedic Surgery

## 2019-10-04 DIAGNOSIS — Z01812 Encounter for preprocedural laboratory examination: Secondary | ICD-10-CM | POA: Diagnosis not present

## 2019-10-04 DIAGNOSIS — Z20822 Contact with and (suspected) exposure to covid-19: Secondary | ICD-10-CM | POA: Diagnosis not present

## 2019-10-04 LAB — SARS CORONAVIRUS 2 (TAT 6-24 HRS): SARS Coronavirus 2: NEGATIVE

## 2019-10-07 MED ORDER — BUPIVACAINE LIPOSOME 1.3 % IJ SUSP
10.0000 mL | INTRAMUSCULAR | Status: DC
  Filled 2019-10-07: qty 10

## 2019-10-07 MED ORDER — TRANEXAMIC ACID 1000 MG/10ML IV SOLN
2000.0000 mg | INTRAVENOUS | Status: DC
  Filled 2019-10-07: qty 20

## 2019-10-07 NOTE — H&P (Signed)
TOTAL HIP ADMISSION H&P  Patient is admitted for right total hip arthroplasty.  Subjective:  Chief Complaint: right hip pain  HPI: Gina Petty, 74 y.o. female, has a history of pain and functional disability in the right hip(s) due to arthritis and patient has failed non-surgical conservative treatments for greater than 12 weeks to include NSAID's and/or analgesics, corticosteriod injections, flexibility and strengthening excercises, use of assistive devices, weight reduction as appropriate and activity modification.  Onset of symptoms was gradual starting 5 years ago with gradually worsening course since that time.The patient noted no past surgery on the right hip(s).  Patient currently rates pain in the right hip at 10 out of 10 with activity. Patient has night pain, worsening of pain with activity and weight bearing, trendelenberg gait, pain that interfers with activities of daily living and crepitus. Patient has evidence of subchondral cysts, subchondral sclerosis, periarticular osteophytes and joint space narrowing by imaging studies. This condition presents safety issues increasing the risk of falls.  There is no current active infection.  Patient Active Problem List   Diagnosis Date Noted  . Hyperlipidemia 12/12/2013  . Essential hypertension, benign 12/03/2013  . Depressive disorder, not elsewhere classified 12/03/2013  . Acute posthemorrhagic anemia 12/03/2013  . 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 Arthritis   . Depression   . Hypertension   . PE (pulmonary embolism)    posssible 74  not able to prove    Past Surgical History:  Procedure Laterality Date  . BACK SURGERY  85  . CATARACT EXTRACTION    . COLONOSCOPY  06/12/2006 last   . POLYPECTOMY    . TOTAL KNEE ARTHROPLASTY Right 11/26/2013   DR DALLDORF  . TOTAL KNEE ARTHROPLASTY Right 11/26/2013   Procedure: TOTAL KNEE ARTHROPLASTY;  Surgeon: [name], MD;  Location: [place];   Service: Orthopedics;  Laterality: Right;  Right total knee arthroplasty    Current Facility-Administered Medications  Medication Dose Route Frequency Provider Last Rate Last Admin  . [START ON 10/08/2019] bupivacaine liposome (EXPAREL) 1.3 % injection 133 mg  10 mL Other On Call to OR Melrose Nakayama, MD      . Derrill Memo ON 10/08/2019] tranexamic acid (CYKLOKAPRON) 2,000 mg in sodium chloride 0.9 % 50 mL Topical Application  123XX123 mg Topical On Call to OR Melrose Nakayama, MD       Current Outpatient Medications  Medication Sig Dispense Refill Last Dose  . Ascorbic Acid (VITAMIN C) 500 MG CHEW Chew 500 mg by mouth daily.     Marland Kitchen b complex vitamins tablet Take 1 tablet by mouth daily.     Marland Kitchen buPROPion (WELLBUTRIN XL) 150 MG 24 hr tablet Take 150 mg by mouth daily.     . diclofenac Sodium (VOLTAREN) 1 % GEL Apply 2 g topically 3 (three) times daily as needed (pain).     Marland Kitchen diphenhydramine-acetaminophen (TYLENOL PM) 25-500 MG TABS tablet Take 1-2 tablets by mouth at bedtime as needed (pain).      Marland Kitchen HYDROcodone-acetaminophen (NORCO/VICODIN) 5-325 MG tablet Take 1 tablet by mouth every 4 (four) hours as needed (pain). 10 tablet 0   . Multiple Vitamin (MULTIVITAMIN WITH MINERALS) TABS tablet Take 1 tablet by mouth daily.     Marland Kitchen triamterene-hydrochlorothiazide (MAXZIDE-25) 37.5-25 MG per tablet Take 1 tablet by mouth daily.     Marland Kitchen CANNABIDIOL PO Take 1 tablet by mouth daily as needed (pain.). CBD GUMMIES      Allergies  Allergen Reactions  . Penicillins Rash    Has patient had a PCN reaction causing immediate rash, facial/tongue/throat swelling, SOB or lightheadedness with hypotension: Yes Has patient had a PCN reaction causing severe rash involving mucus membranes or skin necrosis: No Has patient had a PCN reaction that required hospitalization: No Has patient had a PCN reaction occurring within the last 10 years: No If all of the above answers are "NO", then may proceed with Cephalosporin use.    Social  History   Tobacco Use  . Smoking status: Former Smoker    Packs/day: 1.00    Years: 25.00    Pack years: 25.00    Types: Cigarettes    Quit date: 11/19/2000    Years since quitting: 18.8  . Smokeless tobacco: Never Used  Substance Use Topics  . Alcohol use: Yes    Alcohol/week: 7.0 standard drinks    Types: 7 Glasses of wine per week    Comment: DAILY    Family History  Problem Relation Age of Onset  . Colon cancer Brother 41  . Colon polyps Neg Hx   . Esophageal cancer Neg Hx   . Prostate cancer Neg Hx   . Rectal cancer Neg Hx      Review of Systems  Musculoskeletal: Positive for arthralgias.       Right hip  All other systems reviewed and are negative.   Objective:  Physical Exam  Constitutional: She is oriented to person, place, and time. She appears well-developed and well-nourished.  HENT:  Head: Normocephalic and atraumatic.  Eyes: Pupils are equal, round, and reactive to light.  Cardiovascular: Normal rate and regular rhythm.  Respiratory: Effort normal.  GI: Soft.  Musculoskeletal:     Cervical back: Normal range of motion.     Comments: Right hip motion is limited and extremely painful in internal rotation.  Leg lengths are roughly equal.  Her right knee moves 0-110 with good stability in extension.  She has some pain on her patella but no effusion and good stability.  Calf is soft and nontender.  Neurological: She is alert and oriented to person, place, and time.  Skin: Skin is warm and dry.  Psychiatric: She has a normal mood and affect. Her behavior is normal. Judgment and thought content normal.    Vital signs in last 24 hours:    Labs:   Estimated body mass index is 31.54 kg/m as calculated from the following:   Height as of 10/02/19: 5\' 6"  (1.676 m).   Weight as of 10/02/19: 88.6 kg.   Imaging Review Plain radiographs demonstrate severe degenerative joint disease of the right hip(s). The bone quality appears to be good for age and reported  activity level.      Assessment/Plan:  End stage primary arthritis, right hip(s)  The patient history, physical examination, clinical judgement of the provider and imaging studies are consistent with end stage degenerative joint disease of the right hip(s) and total hip arthroplasty is deemed medically necessary. The treatment options including medical management, injection therapy, arthroscopy and arthroplasty were discussed at length. The risks and benefits of total hip arthroplasty were presented and reviewed. The risks due to aseptic loosening, infection, stiffness, dislocation/subluxation,  thromboembolic complications and other imponderables were discussed.  The patient acknowledged the explanation, agreed to proceed with the plan and consent was signed. Patient is being admitted for inpatient treatment for surgery, pain control, PT, OT, prophylactic antibiotics, VTE prophylaxis, progressive ambulation and ADL's and discharge planning.The patient is  planning to be discharged home with home health services

## 2019-10-08 ENCOUNTER — Observation Stay (HOSPITAL_COMMUNITY)
Admission: RE | Admit: 2019-10-08 | Discharge: 2019-10-09 | Disposition: A | Discharge status: Home / Self Care | Payer: Medicare Other | Source: Other Acute Inpatient Hospital | Attending: Orthopaedic Surgery | Admitting: Orthopaedic Surgery

## 2019-10-08 ENCOUNTER — Ambulatory Visit (HOSPITAL_COMMUNITY): Payer: Medicare Other

## 2019-10-08 ENCOUNTER — Other Ambulatory Visit: Payer: Self-pay

## 2019-10-08 ENCOUNTER — Ambulatory Visit (HOSPITAL_COMMUNITY): Payer: Medicare Other | Admitting: Physician Assistant

## 2019-10-08 ENCOUNTER — Encounter (HOSPITAL_COMMUNITY)
Admission: RE | Disposition: A | Discharge status: Home / Self Care | Payer: Self-pay | Source: Other Acute Inpatient Hospital | Attending: Orthopaedic Surgery

## 2019-10-08 ENCOUNTER — Ambulatory Visit (HOSPITAL_COMMUNITY): Payer: Medicare Other | Admitting: Anesthesiology

## 2019-10-08 ENCOUNTER — Encounter (HOSPITAL_COMMUNITY): Payer: Self-pay | Admitting: Orthopaedic Surgery

## 2019-10-08 DIAGNOSIS — D62 Acute posthemorrhagic anemia: Secondary | ICD-10-CM | POA: Diagnosis not present

## 2019-10-08 DIAGNOSIS — M1611 Unilateral primary osteoarthritis, right hip: Principal | ICD-10-CM | POA: Diagnosis present

## 2019-10-08 DIAGNOSIS — Z86711 Personal history of pulmonary embolism: Secondary | ICD-10-CM | POA: Insufficient documentation

## 2019-10-08 DIAGNOSIS — Z87891 Personal history of nicotine dependence: Secondary | ICD-10-CM | POA: Insufficient documentation

## 2019-10-08 DIAGNOSIS — Z419 Encounter for procedure for purposes other than remedying health state, unspecified: Secondary | ICD-10-CM

## 2019-10-08 DIAGNOSIS — E785 Hyperlipidemia, unspecified: Secondary | ICD-10-CM | POA: Diagnosis not present

## 2019-10-08 DIAGNOSIS — Z471 Aftercare following joint replacement surgery: Secondary | ICD-10-CM | POA: Diagnosis not present

## 2019-10-08 DIAGNOSIS — I10 Essential (primary) hypertension: Secondary | ICD-10-CM | POA: Insufficient documentation

## 2019-10-08 DIAGNOSIS — Z880 Allergy status to penicillin: Secondary | ICD-10-CM | POA: Diagnosis not present

## 2019-10-08 DIAGNOSIS — Z96641 Presence of right artificial hip joint: Secondary | ICD-10-CM | POA: Diagnosis not present

## 2019-10-08 HISTORY — PX: TOTAL HIP ARTHROPLASTY: SHX124

## 2019-10-08 LAB — TYPE AND SCREEN
ABO/RH(D): A NEG
Antibody Screen: NEGATIVE

## 2019-10-08 SURGERY — ARTHROPLASTY, HIP, TOTAL, ANTERIOR APPROACH
Anesthesia: Spinal | Site: Hip | Laterality: Right

## 2019-10-08 MED ORDER — ASPIRIN 81 MG PO CHEW
81.0000 mg | CHEWABLE_TABLET | Freq: Two times a day (BID) | ORAL | Status: DC
  Administered 2019-10-09: 81 mg via ORAL
  Filled 2019-10-08: qty 1

## 2019-10-08 MED ORDER — DOCUSATE SODIUM 100 MG PO CAPS
100.0000 mg | ORAL_CAPSULE | Freq: Two times a day (BID) | ORAL | Status: DC
  Filled 2019-10-08: qty 1

## 2019-10-08 MED ORDER — ACETAMINOPHEN 500 MG PO TABS
500.0000 mg | ORAL_TABLET | Freq: Four times a day (QID) | ORAL | Status: DC
  Administered 2019-10-08 – 2019-10-09 (×3): 500 mg via ORAL
  Filled 2019-10-08 (×3): qty 1

## 2019-10-08 MED ORDER — DIPHENHYDRAMINE HCL 12.5 MG/5ML PO ELIX: 12.5000 mg | ORAL_SOLUTION | ORAL | Status: DC | PRN

## 2019-10-08 MED ORDER — ACETAMINOPHEN 500 MG PO TABS
1000.0000 mg | ORAL_TABLET | Freq: Once | ORAL | Status: AC
  Administered 2019-10-08: 1000 mg via ORAL
  Filled 2019-10-08: qty 2

## 2019-10-08 MED ORDER — LIDOCAINE 2% (20 MG/ML) 5 ML SYRINGE
INTRAMUSCULAR | Status: AC
  Filled 2019-10-08: qty 5

## 2019-10-08 MED ORDER — KETOROLAC TROMETHAMINE 15 MG/ML IJ SOLN: 15.0000 mg | Freq: Once | INTRAMUSCULAR | Status: DC | PRN

## 2019-10-08 MED ORDER — FENTANYL CITRATE (PF) 100 MCG/2ML IJ SOLN: 25.0000 ug | INTRAMUSCULAR | Status: DC | PRN

## 2019-10-08 MED ORDER — TRANEXAMIC ACID 1000 MG/10ML IV SOLN
INTRAVENOUS | Status: DC | PRN
  Administered 2019-10-08: 2000 mg via TOPICAL

## 2019-10-08 MED ORDER — BUPIVACAINE HCL (PF) 0.25 % IJ SOLN
INTRAMUSCULAR | Status: AC
  Filled 2019-10-08: qty 30

## 2019-10-08 MED ORDER — BUPROPION HCL ER (XL) 150 MG PO TB24
150.0000 mg | ORAL_TABLET | Freq: Every day | ORAL | Status: DC
  Administered 2019-10-09: 150 mg via ORAL
  Filled 2019-10-08 (×2): qty 1

## 2019-10-08 MED ORDER — ACETAMINOPHEN 500 MG PO TABS: 500.0000 mg | ORAL_TABLET | Freq: Four times a day (QID) | ORAL | Status: DC

## 2019-10-08 MED ORDER — PROPOFOL 500 MG/50ML IV EMUL
INTRAVENOUS | Status: DC | PRN
  Administered 2019-10-08: 20 mg via INTRAVENOUS
  Administered 2019-10-08: 10 mg via INTRAVENOUS

## 2019-10-08 MED ORDER — PROPOFOL 500 MG/50ML IV EMUL
INTRAVENOUS | Status: DC | PRN
  Administered 2019-10-08: 100 ug/kg/min via INTRAVENOUS

## 2019-10-08 MED ORDER — VANCOMYCIN HCL IN DEXTROSE 1-5 GM/200ML-% IV SOLN
1000.0000 mg | INTRAVENOUS | Status: AC
  Administered 2019-10-08: 1000 mg via INTRAVENOUS
  Filled 2019-10-08: qty 200

## 2019-10-08 MED ORDER — POVIDONE-IODINE 10 % EX SWAB
2.0000 "application " | Freq: Once | CUTANEOUS | Status: AC
  Administered 2019-10-08: 2 via TOPICAL

## 2019-10-08 MED ORDER — EPHEDRINE 5 MG/ML INJ
INTRAVENOUS | Status: AC
  Filled 2019-10-08: qty 10

## 2019-10-08 MED ORDER — STERILE WATER FOR IRRIGATION IR SOLN
Status: DC | PRN
  Administered 2019-10-08 (×2): 1000 mL

## 2019-10-08 MED ORDER — BUPIVACAINE LIPOSOME 1.3 % IJ SUSP
INTRAMUSCULAR | Status: DC | PRN
  Administered 2019-10-08: 10 mL

## 2019-10-08 MED ORDER — TRIAMTERENE-HCTZ 37.5-25 MG PO TABS
1.0000 | ORAL_TABLET | Freq: Every day | ORAL | Status: DC
  Administered 2019-10-09: 09:00:00 1 via ORAL
  Filled 2019-10-08 (×2): qty 1

## 2019-10-08 MED ORDER — FENTANYL CITRATE (PF) 100 MCG/2ML IJ SOLN
INTRAMUSCULAR | Status: AC
  Filled 2019-10-08: qty 2

## 2019-10-08 MED ORDER — TRANEXAMIC ACID-NACL 1000-0.7 MG/100ML-% IV SOLN
1000.0000 mg | INTRAVENOUS | Status: AC
  Administered 2019-10-08: 1000 mg via INTRAVENOUS
  Filled 2019-10-08: qty 100

## 2019-10-08 MED ORDER — ALUM & MAG HYDROXIDE-SIMETH 200-200-20 MG/5ML PO SUSP: 30.0000 mL | ORAL | Status: DC | PRN

## 2019-10-08 MED ORDER — CHLORHEXIDINE GLUCONATE 4 % EX LIQD: 60.0000 mL | Freq: Once | CUTANEOUS | Status: DC

## 2019-10-08 MED ORDER — EPHEDRINE SULFATE-NACL 50-0.9 MG/10ML-% IV SOSY
PREFILLED_SYRINGE | INTRAVENOUS | Status: DC | PRN
  Administered 2019-10-08: 5 mg via INTRAVENOUS
  Administered 2019-10-08: 10 mg via INTRAVENOUS

## 2019-10-08 MED ORDER — MIDAZOLAM HCL 2 MG/2ML IJ SOLN
INTRAMUSCULAR | Status: DC | PRN
  Administered 2019-10-08: 2 mg via INTRAVENOUS

## 2019-10-08 MED ORDER — BUPIVACAINE-EPINEPHRINE (PF) 0.25% -1:200000 IJ SOLN
INTRAMUSCULAR | Status: DC | PRN
  Administered 2019-10-08: 30 mL via PERINEURAL

## 2019-10-08 MED ORDER — OXYCODONE HCL 5 MG PO TABS: 5.0000 mg | ORAL_TABLET | Freq: Once | ORAL | Status: DC | PRN

## 2019-10-08 MED ORDER — METHOCARBAMOL 500 MG PO TABS: 500.0000 mg | ORAL_TABLET | Freq: Four times a day (QID) | ORAL | Status: DC | PRN

## 2019-10-08 MED ORDER — METOCLOPRAMIDE HCL 5 MG/ML IJ SOLN: 5.0000 mg | Freq: Three times a day (TID) | INTRAMUSCULAR | Status: DC | PRN

## 2019-10-08 MED ORDER — MIDAZOLAM HCL 2 MG/2ML IJ SOLN
INTRAMUSCULAR | Status: AC
  Filled 2019-10-08: qty 2

## 2019-10-08 MED ORDER — TRANEXAMIC ACID-NACL 1000-0.7 MG/100ML-% IV SOLN
1000.0000 mg | Freq: Once | INTRAVENOUS | Status: AC
  Administered 2019-10-08: 1000 mg via INTRAVENOUS
  Filled 2019-10-08: qty 100

## 2019-10-08 MED ORDER — OXYCODONE HCL 5 MG/5ML PO SOLN: 5.0000 mg | Freq: Once | ORAL | Status: DC | PRN

## 2019-10-08 MED ORDER — PHENYLEPHRINE 40 MCG/ML (10ML) SYRINGE FOR IV PUSH (FOR BLOOD PRESSURE SUPPORT)
PREFILLED_SYRINGE | INTRAVENOUS | Status: DC | PRN
  Administered 2019-10-08 (×3): 80 ug via INTRAVENOUS

## 2019-10-08 MED ORDER — MORPHINE SULFATE (PF) 2 MG/ML IV SOLN: 0.5000 mg | INTRAVENOUS | Status: DC | PRN

## 2019-10-08 MED ORDER — PHENOL 1.4 % MT LIQD: 1.0000 | OROMUCOSAL | Status: DC | PRN

## 2019-10-08 MED ORDER — MENTHOL 3 MG MT LOZG: 1.0000 | LOZENGE | OROMUCOSAL | Status: DC | PRN

## 2019-10-08 MED ORDER — LACTATED RINGERS IV SOLN: INTRAVENOUS | Status: DC

## 2019-10-08 MED ORDER — METHOCARBAMOL 500 MG IVPB - SIMPLE MED
500.0000 mg | Freq: Four times a day (QID) | INTRAVENOUS | Status: DC | PRN
  Filled 2019-10-08: qty 50

## 2019-10-08 MED ORDER — PROMETHAZINE HCL 25 MG/ML IJ SOLN: 6.2500 mg | INTRAMUSCULAR | Status: DC | PRN

## 2019-10-08 MED ORDER — PHENYLEPHRINE HCL (PRESSORS) 10 MG/ML IV SOLN
INTRAVENOUS | Status: AC
  Filled 2019-10-08: qty 1

## 2019-10-08 MED ORDER — ACETAMINOPHEN 325 MG PO TABS: 325.0000 mg | ORAL_TABLET | Freq: Four times a day (QID) | ORAL | Status: DC | PRN

## 2019-10-08 MED ORDER — ONDANSETRON HCL 4 MG PO TABS: 4.0000 mg | ORAL_TABLET | Freq: Four times a day (QID) | ORAL | Status: DC | PRN

## 2019-10-08 MED ORDER — METOCLOPRAMIDE HCL 5 MG PO TABS: 5.0000 mg | ORAL_TABLET | Freq: Three times a day (TID) | ORAL | Status: DC | PRN

## 2019-10-08 MED ORDER — ONDANSETRON HCL 4 MG/2ML IJ SOLN: 4.0000 mg | Freq: Four times a day (QID) | INTRAMUSCULAR | Status: DC | PRN

## 2019-10-08 MED ORDER — LIDOCAINE 2% (20 MG/ML) 5 ML SYRINGE
INTRAMUSCULAR | Status: DC | PRN
  Administered 2019-10-08: 40 mg via INTRAVENOUS

## 2019-10-08 MED ORDER — KETOROLAC TROMETHAMINE 15 MG/ML IJ SOLN
7.5000 mg | Freq: Four times a day (QID) | INTRAMUSCULAR | Status: DC
  Administered 2019-10-08 – 2019-10-09 (×3): 7.5 mg via INTRAVENOUS
  Filled 2019-10-08 (×4): qty 1

## 2019-10-08 MED ORDER — HYDROCODONE-ACETAMINOPHEN 7.5-325 MG PO TABS: 1.0000 | ORAL_TABLET | ORAL | Status: DC | PRN

## 2019-10-08 MED ORDER — VANCOMYCIN HCL IN DEXTROSE 1-5 GM/200ML-% IV SOLN
1000.0000 mg | Freq: Two times a day (BID) | INTRAVENOUS | Status: AC
  Administered 2019-10-08: 21:00:00 1000 mg via INTRAVENOUS
  Filled 2019-10-08: qty 200

## 2019-10-08 MED ORDER — 0.9 % SODIUM CHLORIDE (POUR BTL) OPTIME
TOPICAL | Status: DC | PRN
  Administered 2019-10-08: 11:00:00 1000 mL

## 2019-10-08 MED ORDER — HYDROCODONE-ACETAMINOPHEN 5-325 MG PO TABS
1.0000 | ORAL_TABLET | ORAL | Status: DC | PRN
  Administered 2019-10-08 – 2019-10-09 (×3): 2 via ORAL
  Filled 2019-10-08 (×3): qty 2

## 2019-10-08 MED ORDER — BISACODYL 5 MG PO TBEC: 5.0000 mg | DELAYED_RELEASE_TABLET | Freq: Every day | ORAL | Status: DC | PRN

## 2019-10-08 MED ORDER — PROPOFOL 500 MG/50ML IV EMUL
INTRAVENOUS | Status: AC
  Filled 2019-10-08: qty 50

## 2019-10-08 SURGICAL SUPPLY — 44 items
BAG DECANTER FOR FLEXI CONT (MISCELLANEOUS) ×3 IMPLANT
BLADE SAW SGTL 18X1.27X75 (BLADE) ×2 IMPLANT
BLADE SAW SGTL 18X1.27X75MM (BLADE) ×1
BOOTIES KNEE HIGH SLOAN (MISCELLANEOUS) ×3 IMPLANT
CELLS DAT CNTRL 66122 CELL SVR (MISCELLANEOUS) ×1 IMPLANT
COVER PERINEAL POST (MISCELLANEOUS) ×3 IMPLANT
COVER SURGICAL LIGHT HANDLE (MISCELLANEOUS) ×3 IMPLANT
COVER WAND RF STERILE (DRAPES) IMPLANT
DECANTER SPIKE VIAL GLASS SM (MISCELLANEOUS) ×3 IMPLANT
DRAPE IMP U-DRAPE 54X76 (DRAPES) ×3 IMPLANT
DRAPE STERI IOBAN 125X83 (DRAPES) ×3 IMPLANT
DRAPE U-SHAPE 47X51 STRL (DRAPES) ×6 IMPLANT
DRSG AQUACEL AG ADV 3.5X 6 (GAUZE/BANDAGES/DRESSINGS) ×3 IMPLANT
DURAPREP 26ML APPLICATOR (WOUND CARE) ×3 IMPLANT
ELECT BLADE TIP CTD 4 INCH (ELECTRODE) ×3 IMPLANT
ELECT REM PT RETURN 15FT ADLT (MISCELLANEOUS) ×3 IMPLANT
ELIMINATOR HOLE APEX DEPUY (Hips) ×2 IMPLANT
GLOVE BIO SURGEON STRL SZ8 (GLOVE) ×6 IMPLANT
GLOVE BIOGEL PI IND STRL 8 (GLOVE) ×2 IMPLANT
GLOVE BIOGEL PI INDICATOR 8 (GLOVE) ×4
GOWN STRL REUS W/TWL XL LVL3 (GOWN DISPOSABLE) ×6 IMPLANT
HEAD FEMORAL 32 CERAMIC (Hips) ×2 IMPLANT
HOLDER FOLEY CATH W/STRAP (MISCELLANEOUS) ×3 IMPLANT
KIT TURNOVER KIT A (KITS) IMPLANT
LINER ACETABULAR 32X50 (Liner) ×2 IMPLANT
LINER PINN ACET GRIP 50X100 ×2 IMPLANT
MANIFOLD NEPTUNE II (INSTRUMENTS) ×3 IMPLANT
NEEDLE HYPO 22GX1.5 SAFETY (NEEDLE) ×3 IMPLANT
NS IRRIG 1000ML POUR BTL (IV SOLUTION) ×3 IMPLANT
PACK ANTERIOR HIP CUSTOM (KITS) ×3 IMPLANT
PENCIL SMOKE EVACUATOR (MISCELLANEOUS) ×2 IMPLANT
PROTECTOR NERVE ULNAR (MISCELLANEOUS) ×3 IMPLANT
RETRACTOR WND ALEXIS 18 MED (MISCELLANEOUS) ×1 IMPLANT
RTRCTR WOUND ALEXIS 18CM MED (MISCELLANEOUS) ×3
STEM FEMORAL SZ6 HIGH ACTIS (Stem) ×2 IMPLANT
SUT ETHIBOND NAB CT1 #1 30IN (SUTURE) ×6 IMPLANT
SUT VIC AB 1 CT1 36 (SUTURE) ×3 IMPLANT
SUT VIC AB 2-0 CT1 27 (SUTURE) ×3
SUT VIC AB 2-0 CT1 TAPERPNT 27 (SUTURE) ×1 IMPLANT
SUT VICRYL AB 3-0 FS1 BRD 27IN (SUTURE) ×3 IMPLANT
SUT VLOC 180 0 24IN GS25 (SUTURE) ×3 IMPLANT
SYR 50ML LL SCALE MARK (SYRINGE) ×3 IMPLANT
TRAY FOLEY MTR SLVR 16FR STAT (SET/KITS/TRAYS/PACK) ×3 IMPLANT
YANKAUER SUCT BULB TIP 10FT TU (MISCELLANEOUS) ×3 IMPLANT

## 2019-10-08 NOTE — Anesthesia Preprocedure Evaluation (Addendum)
Anesthesia Evaluation  Patient identified by MRN, date of birth, ID band Patient awake    Reviewed: Allergy & Precautions, H&P , NPO status , Patient's Chart, lab work & pertinent test results  Airway Mallampati: II  TM Distance: >3 FB Neck ROM: Full    Dental  (+) Teeth Intact, Dental Advisory Given   Pulmonary former smoker,  Quit smoking 2002, 25 pack year history- no inhalers   breath sounds clear to auscultation       Cardiovascular hypertension, Pt. on medications  Rhythm:Regular     Neuro/Psych PSYCHIATRIC DISORDERS Depression negative neurological ROS     GI/Hepatic negative GI ROS, (+)     substance abuse  alcohol use,   Endo/Other  Obesity BMI 32  Renal/GU negative Renal ROS  negative genitourinary   Musculoskeletal  (+) Arthritis , Osteoarthritis,  Back surgery 1983  Right hip DJD   Abdominal (+) + obese,   Peds  Hematology negative hematology ROS (+) hct 45.3, plt 217   Anesthesia Other Findings   Reproductive/Obstetrics negative OB ROS                            Anesthesia Physical Anesthesia Plan  ASA: II  Anesthesia Plan: Spinal   Post-op Pain Management:    Induction:   PONV Risk Score and Plan: 2 and Propofol infusion and TIVA  Airway Management Planned: Natural Airway and Nasal Cannula  Additional Equipment: None  Intra-op Plan:   Post-operative Plan:   Informed Consent: I have reviewed the patients History and Physical, chart, labs and discussed the procedure including the risks, benefits and alternatives for the proposed anesthesia with the patient or authorized representative who has indicated his/her understanding and acceptance.       Plan Discussed with: CRNA  Anesthesia Plan Comments:         Anesthesia Quick Evaluation

## 2019-10-08 NOTE — Evaluation (Signed)
Physical Therapy Evaluation Patient Details Name: Gina Petty MRN: [id number] DOB: 10-13-1945 Today's Date: 10/08/2019   History of Present Illness  R AA-THA; PMH of R TKA  Clinical Impression  Pt is s/p THA resulting in the deficits listed below (see PT Problem List). Pt ambulated 29' with RW, no loss of balance. Initiated THA HEP. Good progress expected, pt is highly motivated. Pt will benefit from skilled PT to increase their independence and safety with mobility to allow discharge to the venue listed below.      Follow Up Recommendations Follow surgeon's recommendation for DC plan and follow-up therapies    Equipment Recommendations  Rolling walker with 5" wheels;3in1 (PT)    Recommendations for Other Services       Precautions / Restrictions Precautions Precautions: Fall Restrictions Weight Bearing Restrictions: No Other Position/Activity Restrictions: WBAT      Mobility  Bed Mobility Overal bed mobility: Modified Independent             General bed mobility comments: HOB up  Transfers Overall transfer level: Needs assistance Equipment used: Rolling walker (2 wheeled) Transfers: Sit to/from Stand Sit to Stand: Min guard         General transfer comment: VCs hand placement  Ambulation/Gait Ambulation/Gait assistance: Min guard Gait Distance (Feet): 85 Feet Assistive device: Rolling walker (2 wheeled) Gait Pattern/deviations: Step-through pattern;Decreased stride length Gait velocity: WFL   General Gait Details: VCs sequencing, no loss of balance  Stairs            Wheelchair Mobility    Modified Rankin (Stroke Patients Only)       Balance Overall balance assessment: Modified Independent                                           Pertinent Vitals/Pain Pain Assessment: 0-10 Pain Score: 2  Pain Location: R hip Pain Descriptors / Indicators: Sore Pain Intervention(s): Limited activity within patient's  tolerance;Monitored during session;Ice applied    Home Living Family/patient expects to be discharged to:: Private residence Living Arrangements: Children Available Help at Discharge: Family;Available PRN/intermittently           Home Equipment: Walker - standard      Prior Function Level of Independence: Independent               Hand Dominance        Extremity/Trunk Assessment   Upper Extremity Assessment Upper Extremity Assessment: Overall WFL for tasks assessed    Lower Extremity Assessment Lower Extremity Assessment: RLE deficits/detail RLE Deficits / Details: knee ext at least 3/5, R hip flexion AAROM ~45* RLE Sensation: WNL;history of peripheral neuropathy RLE Coordination: WNL    Cervical / Trunk Assessment Cervical / Trunk Assessment: Normal  Communication   Communication: No difficulties  Cognition Arousal/Alertness: Awake/alert Behavior During Therapy: WFL for tasks assessed/performed Overall Cognitive Status: Within Functional Limits for tasks assessed                                        General Comments      Exercises Total Joint Exercises Ankle Circles/Pumps: AROM;Both;10 reps;Supine Quad Sets: AROM;Right;5 reps;Supine Heel Slides: AAROM;Right;10 reps;Supine Hip ABduction/ADduction: AAROM;Right;10 reps;Supine   Assessment/Plan    PT Assessment Patient needs continued PT services  PT Problem List Decreased strength;Decreased  activity tolerance;Decreased range of motion;Decreased mobility;Pain       PT Treatment Interventions Gait training;Functional mobility training;Therapeutic exercise;Patient/family education;DME instruction;Therapeutic activities    PT Goals (Current goals can be found in the Care Plan section)  Acute Rehab PT Goals Patient Stated Goal: walk without pain PT Goal Formulation: With patient Time For Goal Achievement: 10/15/19 Potential to Achieve Goals: Good    Frequency 7X/week   Barriers  to discharge        Co-evaluation               AM-PAC PT "6 Clicks" Mobility  Outcome Measure Help needed turning from your back to your side while in a flat bed without using bedrails?: A Little Help needed moving from lying on your back to sitting on the side of a flat bed without using bedrails?: A Little Help needed moving to and from a bed to a chair (including a wheelchair)?: A Little Help needed standing up from a chair using your arms (e.g., wheelchair or bedside chair)?: A Little Help needed to walk in hospital room?: A Little Help needed climbing 3-5 steps with a railing? : A Lot 6 Click Score: 17    End of Session Equipment Utilized During Treatment: Gait belt Activity Tolerance: Patient tolerated treatment well Patient left: in chair;with call bell/phone within reach;with chair alarm set Nurse Communication: Mobility status PT Visit Diagnosis: Difficulty in walking, not elsewhere classified (R26.2);Pain;Muscle weakness (generalized) (M62.81) Pain - Right/Left: Right Pain - part of body: Hip    Time: [id number] PT Time Calculation (min) (ACUTE ONLY): 17 min   Charges:   PT Evaluation $PT Eval Low Complexity: 1 Low         Blondell Reveal Kistler PT 10/08/2019  Acute Rehabilitation Services Pager [phone] Office [phone]

## 2019-10-08 NOTE — Anesthesia Postprocedure Evaluation (Signed)
Anesthesia Post Note  Patient: Gina Petty  Procedure(s) Performed: RIGHT TOTAL HIP ARTHROPLASTY ANTERIOR APPROACH (Right Hip)     Patient location during evaluation: PACU Anesthesia Type: Spinal and MAC Level of consciousness: oriented and awake and alert Pain management: pain level controlled Vital Signs Assessment: post-procedure vital signs reviewed and stable Respiratory status: spontaneous breathing and respiratory function stable Cardiovascular status: blood pressure returned to baseline and stable Postop Assessment: no headache, no backache, no apparent nausea or vomiting and spinal receding Anesthetic complications: no    Last Vitals:  Vitals:   10/08/19 1215 10/08/19 1230  BP: 128/74   Pulse: 73 68  Resp: 20 19  Temp:    SpO2: 100% 100%    Last Pain:  Vitals:   10/08/19 0758  TempSrc:   PainSc: 0-No pain                 Pervis Hocking

## 2019-10-08 NOTE — Interval H&P Note (Signed)
History and Physical Interval Note:  10/08/2019 8:58 AM  Gina Petty  has presented today for surgery, with the diagnosis of RIGHT HIP DEGENERATIVE JOINT DISEASE.  The various methods of treatment have been discussed with the patient and family. After consideration of risks, benefits and other options for treatment, the patient has consented to  Procedure(s): RIGHT TOTAL HIP ARTHROPLASTY ANTERIOR APPROACH (Right) as a surgical intervention.  The patient's history has been reviewed, patient examined, no change in status, stable for surgery.  I have reviewed the patient's chart and labs.  Questions were answered to the patient's satisfaction.     [name]

## 2019-10-08 NOTE — Transfer of Care (Signed)
Immediate Anesthesia Transfer of Care Note  Patient: Gina Petty  Procedure(s) Performed: Procedure(s): RIGHT TOTAL HIP ARTHROPLASTY ANTERIOR APPROACH (Right)  Patient Location: PACU  Anesthesia Type:Spinal  Level of Consciousness: awake, alert  and oriented  Airway & Oxygen Therapy: Patient Spontanous Breathing  Post-op Assessment: Report given to RN and Post -op Vital signs reviewed and stable  Post vital signs: Reviewed and stable  Last Vitals:  Vitals:   10/08/19 0755  BP: 138/70  Pulse: 81  Resp: 18  Temp: 37.1 C  SpO2: 0000000    Complications: No apparent anesthesia complications

## 2019-10-08 NOTE — Anesthesia Procedure Notes (Signed)
Spinal  Patient location during procedure: OR Start time: 10/08/2019 10:08 AM Staffing Performed: resident/CRNA  Anesthesiologist: Pervis Hocking, DO Resident/CRNA: Gerald Leitz, CRNA Preanesthetic Checklist Completed: patient identified, IV checked, site marked, risks and benefits discussed, surgical consent, monitors and equipment checked, pre-op evaluation and timeout performed Spinal Block Patient position: sitting Prep: DuraPrep Patient monitoring: heart rate, continuous pulse ox, blood pressure and cardiac monitor Approach: midline Location: L3-4 Injection technique: single-shot Needle Needle type: Introducer and Pencan  Needle gauge: 24 G Needle length: 9 cm Assessment Sensory level: T4 Additional Notes Negative paresthesia. Negative blood return. Positive free-flowing CSF. Expiration date of kit checked and confirmed. Patient tolerated procedure well, without complications.

## 2019-10-08 NOTE — Op Note (Signed)

## 2019-10-09 ENCOUNTER — Encounter: Payer: Self-pay | Admitting: *Deleted

## 2019-10-09 DIAGNOSIS — Z87891 Personal history of nicotine dependence: Secondary | ICD-10-CM | POA: Diagnosis not present

## 2019-10-09 DIAGNOSIS — Z86711 Personal history of pulmonary embolism: Secondary | ICD-10-CM | POA: Diagnosis not present

## 2019-10-09 DIAGNOSIS — M1611 Unilateral primary osteoarthritis, right hip: Secondary | ICD-10-CM | POA: Diagnosis not present

## 2019-10-09 DIAGNOSIS — I10 Essential (primary) hypertension: Secondary | ICD-10-CM | POA: Diagnosis not present

## 2019-10-09 DIAGNOSIS — Z880 Allergy status to penicillin: Secondary | ICD-10-CM | POA: Diagnosis not present

## 2019-10-09 MED ORDER — HYDROCODONE-ACETAMINOPHEN 5-325 MG PO TABS: 1.0000 | ORAL_TABLET | Freq: Four times a day (QID) | ORAL | 0 refills | Status: DC | PRN

## 2019-10-09 MED ORDER — TIZANIDINE HCL 4 MG PO TABS: 4.0000 mg | ORAL_TABLET | Freq: Four times a day (QID) | ORAL | 1 refills | Status: AC | PRN

## 2019-10-09 MED ORDER — ASPIRIN 81 MG PO CHEW: 81.0000 mg | CHEWABLE_TABLET | Freq: Two times a day (BID) | ORAL | 0 refills | Status: DC

## 2019-10-09 NOTE — Progress Notes (Signed)
Physical Therapy Treatment Patient Details Name: Gina Petty MRN: [id number] DOB: September 01, 1945 Today's Date: 10/09/2019    History of Present Illness R AA-THA; PMH of R TKA    PT Comments    Pt ambulated 130' with RW, completed stair training, and demonstrates good understanding of HEP. From PT standpoint, she is ready to DC home.   Follow Up Recommendations  Follow surgeon's recommendation for DC plan and follow-up therapies     Equipment Recommendations  Rolling walker with 5" wheels;3in1 (PT)    Recommendations for Other Services       Precautions / Restrictions Precautions Precautions: Fall Restrictions Weight Bearing Restrictions: No Other Position/Activity Restrictions: WBAT    Mobility  Bed Mobility Overal bed mobility: Modified Independent             General bed mobility comments: HOB up  Transfers Overall transfer level: Needs assistance Equipment used: Rolling walker (2 wheeled) Transfers: Sit to/from Stand Sit to Stand: Supervision         General transfer comment: VCs hand placement  Ambulation/Gait Ambulation/Gait assistance: Supervision Gait Distance (Feet): 130 Feet Assistive device: Rolling walker (2 wheeled) Gait Pattern/deviations: Step-through pattern;Decreased stride length Gait velocity: WFL   General Gait Details: VCs sequencing, no loss of balance   Stairs Stairs: Yes Stairs assistance: Min assist Stair Management: No rails;Backwards;Step to pattern;With walker Number of Stairs: 2 General stair comments: VCs sequencing, min A to manage RW   Wheelchair Mobility    Modified Rankin (Stroke Patients Only)       Balance Overall balance assessment: Modified Independent                                          Cognition Arousal/Alertness: Awake/alert Behavior During Therapy: WFL for tasks assessed/performed Overall Cognitive Status: Within Functional Limits for tasks assessed                                         Exercises Total Joint Exercises Ankle Circles/Pumps: AROM;Both;10 reps;Supine Quad Sets: AROM;Right;5 reps;Supine Short Arc Quad: AROM;Right;10 reps;Supine Heel Slides: AAROM;Right;10 reps;Supine Hip ABduction/ADduction: AAROM;Right;10 reps;Supine Long Arc Quad: AROM;Right;10 reps;Seated    General Comments        Pertinent Vitals/Pain Pain Score: 5  Pain Location: R hip Pain Descriptors / Indicators: Sore Pain Intervention(s): Limited activity within patient's tolerance;Monitored during session;Ice applied;RN gave pain meds during session    Home Living                      Prior Function            PT Goals (current goals can now be found in the care plan section) Acute Rehab PT Goals Patient Stated Goal: walk without pain PT Goal Formulation: With patient Time For Goal Achievement: 10/15/19 Potential to Achieve Goals: Good Progress towards PT goals: Progressing toward goals    Frequency    7X/week      PT Plan Current plan remains appropriate    Co-evaluation              AM-PAC PT "6 Clicks" Mobility   Outcome Measure  Help needed turning from your back to your side while in a flat bed without using bedrails?: A Little Help needed moving from lying on your back  to sitting on the side of a flat bed without using bedrails?: A Little Help needed moving to and from a bed to a chair (including a wheelchair)?: A Little Help needed standing up from a chair using your arms (e.g., wheelchair or bedside chair)?: A Little Help needed to walk in hospital room?: A Little Help needed climbing 3-5 steps with a railing? : A Little 6 Click Score: 18    End of Session Equipment Utilized During Treatment: Gait belt Activity Tolerance: Patient tolerated treatment well Patient left: in chair;with call bell/phone within reach;with chair alarm set Nurse Communication: Mobility status PT Visit Diagnosis: Difficulty in walking,  not elsewhere classified (R26.2);Pain;Muscle weakness (generalized) (M62.81) Pain - Right/Left: Right Pain - part of body: Hip     Time: [id number] PT Time Calculation (min) (ACUTE ONLY): 26 min  Charges:  $Gait Training: 8-22 mins $Therapeutic Exercise: 8-22 mins                     Blondell Reveal Kistler PT 10/09/2019  Acute Rehabilitation Services Pager [phone] Office [phone]

## 2019-10-09 NOTE — TOC Initial Note (Signed)
Transition of Care [facility]) - Initial/Assessment Note    Patient Details  Name: Gina Petty MRN: [id number] Date of Birth: 05-18-1946  [facility]) CM/SW Contact:    Leeroy Cha, RN Phone Number: 10/09/2019, 10:15 AM  Clinical Narrative:                 dcd to home  Expected Discharge Plan: [place] Barriers to Discharge: No Barriers Identified   Patient Goals and CMS Choice Patient states their goals for this hospitalization and ongoing recovery are:: to go home CMS Medicare.gov Compare Post Acute Care list provided to:: Patient Choice offered to / list presented to : Patient  Expected Discharge Plan and Services Expected Discharge Plan: [place]   Discharge Planning Services: CM Consult Post Acute Care Choice: [place] arrangements for the past 2 months: Single Family Home Expected Discharge Date: 10/09/19               DME Arranged: [place] Rile rolling DME Agency: [employer]       HH Arranged: PT HH Agency: [facility] [employer]        Prior Living Arrangements/Services Living arrangements for the past 2 months: [place] Lives with:: Spouse                   Activities of Daily Living Home Assistive Devices/Equipment: Environmental consultant (specify type), Shower chair with back, Hand-held shower hose, Eyeglasses, Dentures (specify type)(no wheels, partials) ADL Screening (condition at time of admission) Patient's cognitive ability adequate to safely complete daily activities?: Yes Is the patient deaf or have difficulty hearing?: No Does the patient have difficulty seeing, even when wearing glasses/contacts?: No Does the patient have difficulty concentrating, remembering, or making decisions?: No Patient able to express need for assistance with ADLs?: Yes Does the patient have difficulty dressing or bathing?: No Independently performs ADLs?: Yes (appropriate for developmental  age) Does the patient have difficulty walking or climbing stairs?: Yes Weakness of Legs: None Weakness of Arms/Hands: None  Permission Sought/Granted                  Emotional Assessment Appearance:: Appears stated age     Orientation: : Oriented to Self, Oriented to Place, Oriented to  Time, Oriented to Situation Alcohol / Substance Use: Not Applicable Psych Involvement: No (comment)  Admission diagnosis:  Primary osteoarthritis of right hip [M16.11] Patient Active Problem List   Diagnosis Date Noted  . Primary osteoarthritis of right hip 10/08/2019  . Hyperlipidemia 12/12/2013  . Essential hypertension, benign 12/03/2013  . Depressive disorder, not elsewhere classified 12/03/2013  . Acute posthemorrhagic anemia 12/03/2013  . Right knee DJD 11/26/2013   PCP:  Harlan Stains, MD Pharmacy:   [facility] [address] [place] [address] [place] [address] Phone: [phone] Fax: [phone]     Social Determinants of Health (SDOH) Interventions    Readmission Risk Interventions No flowsheet data found.

## 2019-10-09 NOTE — Plan of Care (Signed)
All discharge instructions were given to the Pt. All Questions were answered.

## 2019-10-09 NOTE — Progress Notes (Signed)
Subjective: 1 Day Post-Op Procedure(s) (LRB): RIGHT TOTAL HIP ARTHROPLASTY ANTERIOR APPROACH (Right)  Patient doing well and is looking forward to going home. She states she already feels better than she did before surgery.  Activity level:  wbat Diet tolerance:  ok Voiding:  ok Patient reports pain as mild.    Objective: Vital signs in last 24 hours: Temp:  [97.6 F (36.4 C)-98.6 F (37 C)] 97.8 F (36.6 C) (03/24 0556) Pulse Rate:  [68-89] 76 (03/24 0556) Resp:  [16-20] 16 (03/24 0556) BP: (112-149)/(68-87) 138/87 (03/24 0556) SpO2:  [94 %-100 %] 96 % (03/24 0556) Weight:  [88.6 kg] 88.6 kg (03/23 1446)  Labs: No results for input(s): HGB in the last 72 hours. No results for input(s): WBC, RBC, HCT, PLT in the last 72 hours. No results for input(s): NA, K, CL, CO2, BUN, CREATININE, GLUCOSE, CALCIUM in the last 72 hours. No results for input(s): LABPT, INR in the last 72 hours.  Physical Exam:  Neurologically intact ABD soft Neurovascular intact Sensation intact distally Intact pulses distally Dorsiflexion/Plantar flexion intact Incision: dressing C/D/I and no drainage No cellulitis present Compartment soft  Assessment/Plan:  1 Day Post-Op Procedure(s) (LRB): RIGHT TOTAL HIP ARTHROPLASTY ANTERIOR APPROACH (Right) Advance diet Up with therapy D/C IV fluids Discharge home with home health today after PT. Continue on ASA 81mg  BID x 4 weeks for DVT prevention. Follow up in office 2 weeks post op.  Gina Petty 10/09/2019, 8:00 AM

## 2019-10-09 NOTE — Discharge Summary (Signed)
Patient ID: Gina Petty MRN: [id number] DOB/AGE: [birth year] 74 y.o.  Admit date: 10/08/2019 Discharge date: 10/09/2019  Admission Diagnoses:  Principal Problem:   Primary osteoarthritis of right hip   Discharge Diagnoses:  Same  Past Medical History:  Diagnosis Date  . Arthritis   . Depression   . Hypertension   . PE (pulmonary embolism)    posssible 74  not able to prove    Surgeries: Procedure(s): RIGHT TOTAL HIP ARTHROPLASTY ANTERIOR APPROACH on 10/08/2019   Consultants:   Discharged Condition: Improved  Hospital Course: SHAQUAY PERLSTEIN is an 74 y.o. female who was admitted 10/08/2019 for operative treatment ofPrimary osteoarthritis of right hip. Patient has severe unremitting pain that affects sleep, daily activities, and work/hobbies. After pre-op clearance the patient was taken to the operating room on 10/08/2019 and underwent  Procedure(s): RIGHT TOTAL HIP ARTHROPLASTY ANTERIOR APPROACH.    Patient was given perioperative antibiotics:  Anti-infectives (From admission, onward)   Start     Dose/Rate Route Frequency Ordered Stop   10/08/19 2130  vancomycin (VANCOCIN) IVPB 1000 mg/200 mL premix     1,000 mg 200 mL/hr over 60 Minutes Intravenous Every 12 hours 10/08/19 1310 10/08/19 2132   10/08/19 0745  vancomycin (VANCOCIN) IVPB 1000 mg/200 mL premix     1,000 mg 200 mL/hr over 60 Minutes Intravenous On call to O.R. 10/08/19 [id number] 10/08/19 1010       Patient was given sequential compression devices, early ambulation, and chemoprophylaxis to prevent DVT.  Patient benefited maximally from hospital stay and there were no complications.    Recent vital signs:  Patient Vitals for the past 24 hrs:  BP Temp Temp src Pulse Resp SpO2 Height Weight  10/09/19 0556 138/87 97.8 F (36.6 C) Oral 76 16 96 % -- --  10/09/19 0212 131/74 97.9 F (36.6 C) Oral 77 16 97 % -- --  10/08/19 2226 125/73 98.4 F (36.9 C) Oral 77 20 97 % -- --  10/08/19 1814 (!) 149/77 98.6 F (37 C) Oral  89 16 94 % -- --  10/08/19 1510 129/69 -- -- 79 16 99 % -- --  10/08/19 1446 -- -- -- -- -- -- 5\' 6"  (1.676 m) 88.6 kg  10/08/19 1409 127/74 97.7 F (36.5 C) Axillary 76 18 100 % -- --  10/08/19 1310 133/75 97.6 F (36.4 C) Oral 77 18 100 % -- --  10/08/19 1245 118/78 -- -- 73 19 100 % -- --  10/08/19 1230 129/72 -- -- 68 19 100 % -- --  10/08/19 1215 128/74 -- -- 73 20 100 % -- --  10/08/19 1210 112/68 97.6 F (36.4 C) -- 74 16 99 % -- --     Recent laboratory studies: No results for input(s): WBC, HGB, HCT, PLT, NA, K, CL, CO2, BUN, CREATININE, GLUCOSE, INR, CALCIUM in the last 72 hours.  Invalid input(s): PT, 2   Discharge Medications:   Allergies as of 10/09/2019      Reactions   Penicillins Rash   Has patient had a PCN reaction causing immediate rash, facial/tongue/throat swelling, SOB or lightheadedness with hypotension: Yes Has patient had a PCN reaction causing severe rash involving mucus membranes or skin necrosis: No Has patient had a PCN reaction that required hospitalization: No Has patient had a PCN reaction occurring within the last 10 years: No If all of the above answers are "NO", then may proceed with Cephalosporin use.      Medication List    TAKE  these medications   aspirin 81 MG chewable tablet Chew 1 tablet (81 mg total) by mouth 2 (two) times daily.   b complex vitamins tablet Take 1 tablet by mouth daily.   buPROPion 150 MG 24 hr tablet Commonly known as: WELLBUTRIN XL Take 150 mg by mouth daily.   CANNABIDIOL PO Take 1 tablet by mouth daily as needed (pain.). CBD GUMMIES   diphenhydramine-acetaminophen 25-500 MG Tabs tablet Commonly known as: TYLENOL PM Take 1-2 tablets by mouth at bedtime as needed (pain).   HYDROcodone-acetaminophen 5-325 MG tablet Commonly known as: NORCO/VICODIN Take 1 tablet by mouth every 6 (six) hours as needed for moderate pain or severe pain (post op pain). What changed:   when to take this  reasons to take  this   multivitamin with minerals Tabs tablet Take 1 tablet by mouth daily.   tiZANidine 4 MG tablet Commonly known as: Zanaflex Take 1 tablet (4 mg total) by mouth every 6 (six) hours as needed for muscle spasms.   triamterene-hydrochlorothiazide 37.5-25 MG tablet Commonly known as: MAXZIDE-25 Take 1 tablet by mouth daily.   Vitamin C 500 MG Chew Chew 500 mg by mouth daily.   Voltaren 1 % Gel Generic drug: diclofenac Sodium Apply 2 g topically 3 (three) times daily as needed (pain).            Durable Medical Equipment  (From admission, onward)         Start     Ordered   10/08/19 1310  DME Walker rolling  Once    Question:  Patient needs a walker to treat with the following condition  Answer:  Primary osteoarthritis of right hip   10/08/19 1310   10/08/19 1310  DME 3 n 1  Once     10/08/19 1310   10/08/19 1310  DME Bedside commode  Once    Question:  Patient needs a bedside commode to treat with the following condition  Answer:  Primary osteoarthritis of right hip   10/08/19 1310          Diagnostic Studies: DG Chest 2 View  Result Date: 10/02/2019 CLINICAL DATA:  Preoperative examination. EXAM: CHEST - 2 VIEW COMPARISON:  11/19/2013 chest radiograph. FINDINGS: Stable cardiomediastinal silhouette with normal heart size. No pneumothorax. No pleural effusion. Lungs appear clear, with no acute consolidative airspace disease and no pulmonary edema. IMPRESSION: No active cardiopulmonary disease. Electronically Signed   By: Ilona Sorrel M.D.   On: 10/02/2019 15:06   DG C-Arm 1-60 Min-No Report  Result Date: 10/08/2019 Fluoroscopy was utilized by the requesting physician.  No radiographic interpretation.   DG HIP OPERATIVE UNILAT W OR W/O PELVIS RIGHT  Result Date: 10/08/2019 CLINICAL DATA:  Right hip replacement EXAM: OPERATIVE RIGHT HIP WITH PELVIS COMPARISON:  None. FLUOROSCOPY TIME:  Radiation Exposure Index (as provided by the fluoroscopic device): Not available  If the device does not provide the exposure index: Fluoroscopy Time:  Not available Number of Acquired Images:  4 FINDINGS: Initial images demonstrates significant degenerative changes of the superior aspect of the right hip joint. Subsequent images show right hip replacement in satisfactory position. No acute bony or soft tissue abnormality is seen. IMPRESSION: Right hip replacement Electronically Signed   By: Inez Catalina M.D.   On: 10/08/2019 12:03    Disposition: Discharge disposition: 01-Home or Self Care       Discharge Instructions    Call MD / Call 911   Complete by: As directed  If you experience chest pain or shortness of breath, CALL 911 and be transported to the hospital emergency room.  If you develope a fever above 101 F, pus (white drainage) or increased drainage or redness at the wound, or calf pain, call your surgeon's office.   Constipation Prevention   Complete by: As directed    Drink plenty of fluids.  Prune juice may be helpful.  You may use a stool softener, such as Colace (over the counter) 100 mg twice a day.  Use MiraLax (over the counter) for constipation as needed.   Diet - low sodium heart healthy   Complete by: As directed    Discharge instructions   Complete by: As directed    INSTRUCTIONS AFTER JOINT REPLACEMENT   Remove items at home which could result in a fall. This includes throw rugs or furniture in walking pathways ICE to the affected joint every three hours while awake for 30 minutes at a time, for at least the first 3-5 days, and then as needed for pain and swelling.  Continue to use ice for pain and swelling. You may notice swelling that will progress down to the foot and ankle.  This is normal after surgery.  Elevate your leg when you are not up walking on it.   Continue to use the breathing machine you got in the hospital (incentive spirometer) which will help keep your temperature down.  It is common for your temperature to cycle up and down  following surgery, especially at night when you are not up moving around and exerting yourself.  The breathing machine keeps your lungs expanded and your temperature down.   DIET:  As you were doing prior to hospitalization, we recommend a well-balanced diet.  DRESSING / WOUND CARE / SHOWERING  You may shower 3 days after surgery, but keep the wounds dry during showering.  You may use an occlusive plastic wrap (Press'n Seal for example), NO SOAKING/SUBMERGING IN THE BATHTUB.  If the bandage gets wet, change with a clean dry gauze.  If the incision gets wet, pat the wound dry with a clean towel.  ACTIVITY  Increase activity slowly as tolerated, but follow the weight bearing instructions below.   No driving for 6 weeks or until further direction given by your physician.  You cannot drive while taking narcotics.  No lifting or carrying greater than 10 lbs. until further directed by your surgeon. Avoid periods of inactivity such as sitting longer than an hour when not asleep. This helps prevent blood clots.  You may return to work once you are authorized by your doctor.     WEIGHT BEARING   Weight bearing as tolerated with assist device (walker, cane, etc) as directed, use it as long as suggested by your surgeon or therapist, typically at least 4-6 weeks.   EXERCISES  Results after joint replacement surgery are often greatly improved when you follow the exercise, range of motion and muscle strengthening exercises prescribed by your doctor. Safety measures are also important to protect the joint from further injury. Any time any of these exercises cause you to have increased pain or swelling, decrease what you are doing until you are comfortable again and then slowly increase them. If you have problems or questions, call your caregiver or physical therapist for advice.   Rehabilitation is important following a joint replacement. After just a few days of immobilization, the muscles of the leg  can become weakened and shrink (atrophy).  These exercises are  designed to build up the tone and strength of the thigh and leg muscles and to improve motion. Often times heat used for twenty to thirty minutes before working out will loosen up your tissues and help with improving the range of motion but do not use heat for the first two weeks following surgery (sometimes heat can increase post-operative swelling).   These exercises can be done on a training (exercise) mat, on the floor, on a table or on a bed. Use whatever works the best and is most comfortable for you.    Use music or television while you are exercising so that the exercises are a pleasant break in your day. This will make your life better with the exercises acting as a break in your routine that you can look forward to.   Perform all exercises about fifteen times, three times per day or as directed.  You should exercise both the operative leg and the other leg as well.   Exercises include:   Quad Sets - Tighten up the muscle on the front of the thigh (Quad) and hold for 5-10 seconds.   Straight Leg Raises - With your knee straight (if you were given a brace, keep it on), lift the leg to 60 degrees, hold for 3 seconds, and slowly lower the leg.  Perform this exercise against resistance later as your leg gets stronger.  Leg Slides: Lying on your back, slowly slide your foot toward your buttocks, bending your knee up off the floor (only go as far as is comfortable). Then slowly slide your foot back down until your leg is flat on the floor again.  Angel Wings: Lying on your back spread your legs to the side as far apart as you can without causing discomfort.  Hamstring Strength:  Lying on your back, push your heel against the floor with your leg straight by tightening up the muscles of your buttocks.  Repeat, but this time bend your knee to a comfortable angle, and push your heel against the floor.  You may put a pillow under the heel to make  it more comfortable if necessary.   A rehabilitation program following joint replacement surgery can speed recovery and prevent re-injury in the future due to weakened muscles. Contact your doctor or a physical therapist for more information on knee rehabilitation.    CONSTIPATION  Constipation is defined medically as fewer than three stools per week and severe constipation as less than one stool per week.  Even if you have a regular bowel pattern at home, your normal regimen is likely to be disrupted due to multiple reasons following surgery.  Combination of anesthesia, postoperative narcotics, change in appetite and fluid intake all can affect your bowels.   YOU MUST use at least one of the following options; they are listed in order of increasing strength to get the job done.  They are all available over the counter, and you may need to use some, POSSIBLY even all of these options:    Drink plenty of fluids (prune juice may be helpful) and high fiber foods Colace 100 mg by mouth twice a day  Senokot for constipation as directed and as needed Dulcolax (bisacodyl), take with full glass of water  Miralax (polyethylene glycol) once or twice a day as needed.  If you have tried all these things and are unable to have a bowel movement in the first 3-4 days after surgery call either your surgeon or your primary doctor.  If you experience loose stools or diarrhea, hold the medications until you stool forms back up.  If your symptoms do not get better within 1 week or if they get worse, check with your doctor.  If you experience "the worst abdominal pain ever" or develop nausea or vomiting, please contact the office immediately for further recommendations for treatment.   ITCHING:  If you experience itching with your medications, try taking only a single pain pill, or even half a pain pill at a time.  You can also use Benadryl over the counter for itching or also to help with sleep.   TED HOSE  STOCKINGS:  Use stockings on both legs until for at least 2 weeks or as directed by physician office. They may be removed at night for sleeping.  MEDICATIONS:  See your medication summary on the "After Visit Summary" that nursing will review with you.  You may have some home medications which will be placed on hold until you complete the course of blood thinner medication.  It is important for you to complete the blood thinner medication as prescribed.  PRECAUTIONS:  If you experience chest pain or shortness of breath - call 911 immediately for transfer to the hospital emergency department.   If you develop a fever greater that 101 F, purulent drainage from wound, increased redness or drainage from wound, foul odor from the wound/dressing, or calf pain - CONTACT YOUR SURGEON.                                                   FOLLOW-UP APPOINTMENTS:  If you do not already have a post-op appointment, please call the office for an appointment to be seen by your surgeon.  Guidelines for how soon to be seen are listed in your "After Visit Summary", but are typically between 1-4 weeks after surgery.  OTHER INSTRUCTIONS:   Knee Replacement:  Do not place pillow under knee, focus on keeping the knee straight while resting. CPM instructions: 0-90 degrees, 2 hours in the morning, 2 hours in the afternoon, and 2 hours in the evening. Place foam block, curve side up under heel at all times except when in CPM or when walking.  DO NOT modify, tear, cut, or change the foam block in any way.   DENTAL ANTIBIOTICS:  In most cases prophylactic antibiotics for Dental procdeures after total joint surgery are not necessary.  Exceptions are as follows:  1. History of prior total joint infection  2. Severely immunocompromised (Organ Transplant, cancer chemotherapy, Rheumatoid biologic meds such as [place])  3. Poorly controlled diabetes (A1C &gt; 8.0, blood glucose over 200)  If you have one of these conditions,  contact your surgeon for an antibiotic prescription, prior to your dental procedure.   MAKE SURE YOU:  Understand these instructions.  Get help right away if you are not doing well or get worse.    Thank you for letting us be a part of your medical care team.  It is a privilege we respect greatly.  We hope these instructions will help you stay on track for a fast and full recovery!   Increase activity slowly as tolerated   Complete by: As directed       Follow-up Information    Melrose Nakayama, MD. Go on 10/21/2019.   Specialty: Orthopedic Surgery Why: Your appointment  is scheduled for 10:15.  Contact information: [place] [address] [phone]        [facility] Follow up.   Specialty: [place] Why: You will be seen at home by [facility] for 5 visits prior to starting OPPT  Contact information: [place] [address] [phone]        [facility], [place]. Go on 10/21/2019.   Why: You are scheduled to start therapy at 12:00. Please arrive at 11:45 to complete your paperwork  Contact information: [place] [address] [phone]            Signed: Larwance Sachs Maryclare Nydam 10/09/2019, 8:04 AM

## 2019-10-10 DIAGNOSIS — Z96641 Presence of right artificial hip joint: Secondary | ICD-10-CM | POA: Diagnosis not present

## 2019-10-10 DIAGNOSIS — M25551 Pain in right hip: Secondary | ICD-10-CM | POA: Diagnosis not present

## 2019-10-11 DIAGNOSIS — M1611 Unilateral primary osteoarthritis, right hip: Secondary | ICD-10-CM | POA: Diagnosis not present

## 2019-10-30 DIAGNOSIS — Z9889 Other specified postprocedural states: Secondary | ICD-10-CM | POA: Diagnosis not present

## 2019-12-25 DIAGNOSIS — L570 Actinic keratosis: Secondary | ICD-10-CM | POA: Diagnosis not present

## 2019-12-25 DIAGNOSIS — L219 Seborrheic dermatitis, unspecified: Secondary | ICD-10-CM | POA: Diagnosis not present

## 2019-12-30 DIAGNOSIS — M25551 Pain in right hip: Secondary | ICD-10-CM | POA: Diagnosis not present

## 2020-03-04 DIAGNOSIS — L814 Other melanin hyperpigmentation: Secondary | ICD-10-CM | POA: Diagnosis not present

## 2020-03-04 DIAGNOSIS — L821 Other seborrheic keratosis: Secondary | ICD-10-CM | POA: Diagnosis not present

## 2020-03-04 DIAGNOSIS — L578 Other skin changes due to chronic exposure to nonionizing radiation: Secondary | ICD-10-CM | POA: Diagnosis not present

## 2020-03-04 DIAGNOSIS — L570 Actinic keratosis: Secondary | ICD-10-CM | POA: Diagnosis not present

## 2020-03-04 DIAGNOSIS — D2272 Melanocytic nevi of left lower limb, including hip: Secondary | ICD-10-CM | POA: Diagnosis not present

## 2020-03-04 DIAGNOSIS — D225 Melanocytic nevi of trunk: Secondary | ICD-10-CM | POA: Diagnosis not present

## 2020-04-13 DIAGNOSIS — I10 Essential (primary) hypertension: Secondary | ICD-10-CM | POA: Diagnosis not present

## 2020-04-13 DIAGNOSIS — Z23 Encounter for immunization: Secondary | ICD-10-CM | POA: Diagnosis not present

## 2020-04-13 DIAGNOSIS — Z1159 Encounter for screening for other viral diseases: Secondary | ICD-10-CM | POA: Diagnosis not present

## 2020-04-13 DIAGNOSIS — D692 Other nonthrombocytopenic purpura: Secondary | ICD-10-CM | POA: Diagnosis not present

## 2020-04-13 DIAGNOSIS — F339 Major depressive disorder, recurrent, unspecified: Secondary | ICD-10-CM | POA: Diagnosis not present

## 2020-04-13 DIAGNOSIS — M7989 Other specified soft tissue disorders: Secondary | ICD-10-CM | POA: Diagnosis not present

## 2020-04-13 DIAGNOSIS — E559 Vitamin D deficiency, unspecified: Secondary | ICD-10-CM | POA: Diagnosis not present

## 2020-04-13 DIAGNOSIS — F419 Anxiety disorder, unspecified: Secondary | ICD-10-CM | POA: Diagnosis not present

## 2020-04-20 DIAGNOSIS — Z23 Encounter for immunization: Secondary | ICD-10-CM | POA: Diagnosis not present

## 2020-06-03 DIAGNOSIS — L821 Other seborrheic keratosis: Secondary | ICD-10-CM | POA: Diagnosis not present

## 2020-06-03 DIAGNOSIS — D485 Neoplasm of uncertain behavior of skin: Secondary | ICD-10-CM | POA: Diagnosis not present

## 2020-06-03 DIAGNOSIS — D044 Carcinoma in situ of skin of scalp and neck: Secondary | ICD-10-CM | POA: Diagnosis not present

## 2020-07-16 DIAGNOSIS — R059 Cough, unspecified: Secondary | ICD-10-CM | POA: Diagnosis not present

## 2020-07-16 DIAGNOSIS — R051 Acute cough: Secondary | ICD-10-CM | POA: Diagnosis not present

## 2020-07-16 DIAGNOSIS — Z03818 Encounter for observation for suspected exposure to other biological agents ruled out: Secondary | ICD-10-CM | POA: Diagnosis not present

## 2020-08-26 DIAGNOSIS — D044 Carcinoma in situ of skin of scalp and neck: Secondary | ICD-10-CM | POA: Diagnosis not present

## 2020-09-24 DIAGNOSIS — L570 Actinic keratosis: Secondary | ICD-10-CM | POA: Diagnosis not present

## 2020-09-24 DIAGNOSIS — D044 Carcinoma in situ of skin of scalp and neck: Secondary | ICD-10-CM | POA: Diagnosis not present

## 2020-11-24 DIAGNOSIS — L309 Dermatitis, unspecified: Secondary | ICD-10-CM | POA: Diagnosis not present

## 2020-11-24 DIAGNOSIS — L570 Actinic keratosis: Secondary | ICD-10-CM | POA: Diagnosis not present

## 2020-11-24 DIAGNOSIS — D044 Carcinoma in situ of skin of scalp and neck: Secondary | ICD-10-CM | POA: Diagnosis not present

## 2020-12-03 DIAGNOSIS — Z23 Encounter for immunization: Secondary | ICD-10-CM | POA: Diagnosis not present

## 2021-02-24 DIAGNOSIS — L570 Actinic keratosis: Secondary | ICD-10-CM | POA: Diagnosis not present

## 2021-04-14 DIAGNOSIS — Z0000 Encounter for general adult medical examination without abnormal findings: Secondary | ICD-10-CM | POA: Diagnosis not present

## 2021-04-14 DIAGNOSIS — F331 Major depressive disorder, recurrent, moderate: Secondary | ICD-10-CM | POA: Diagnosis not present

## 2021-04-14 DIAGNOSIS — R0609 Other forms of dyspnea: Secondary | ICD-10-CM | POA: Diagnosis not present

## 2021-04-14 DIAGNOSIS — E559 Vitamin D deficiency, unspecified: Secondary | ICD-10-CM | POA: Diagnosis not present

## 2021-04-14 DIAGNOSIS — I129 Hypertensive chronic kidney disease with stage 1 through stage 4 chronic kidney disease, or unspecified chronic kidney disease: Secondary | ICD-10-CM | POA: Diagnosis not present

## 2021-04-14 DIAGNOSIS — G6289 Other specified polyneuropathies: Secondary | ICD-10-CM | POA: Diagnosis not present

## 2021-04-14 DIAGNOSIS — E785 Hyperlipidemia, unspecified: Secondary | ICD-10-CM | POA: Diagnosis not present

## 2021-04-14 DIAGNOSIS — M7989 Other specified soft tissue disorders: Secondary | ICD-10-CM | POA: Diagnosis not present

## 2021-04-14 DIAGNOSIS — N183 Chronic kidney disease, stage 3 unspecified: Secondary | ICD-10-CM | POA: Diagnosis not present

## 2021-04-14 DIAGNOSIS — Z23 Encounter for immunization: Secondary | ICD-10-CM | POA: Diagnosis not present

## 2021-04-15 ENCOUNTER — Other Ambulatory Visit: Payer: Self-pay | Admitting: Family Medicine

## 2021-04-15 ENCOUNTER — Ambulatory Visit
Admission: RE | Admit: 2021-04-15 | Discharge: 2021-04-15 | Disposition: A | Discharge status: Home / Self Care | Payer: Medicare Other | Source: Ambulatory Visit | Attending: Family Medicine | Admitting: Family Medicine

## 2021-04-15 DIAGNOSIS — R7989 Other specified abnormal findings of blood chemistry: Secondary | ICD-10-CM | POA: Diagnosis not present

## 2021-04-15 DIAGNOSIS — M7989 Other specified soft tissue disorders: Secondary | ICD-10-CM

## 2021-04-15 DIAGNOSIS — R0609 Other forms of dyspnea: Secondary | ICD-10-CM

## 2021-04-15 DIAGNOSIS — R0600 Dyspnea, unspecified: Secondary | ICD-10-CM

## 2021-05-20 DIAGNOSIS — F331 Major depressive disorder, recurrent, moderate: Secondary | ICD-10-CM | POA: Diagnosis not present

## 2021-05-20 DIAGNOSIS — G629 Polyneuropathy, unspecified: Secondary | ICD-10-CM | POA: Diagnosis not present

## 2021-05-20 DIAGNOSIS — Z23 Encounter for immunization: Secondary | ICD-10-CM | POA: Diagnosis not present

## 2021-05-20 DIAGNOSIS — F419 Anxiety disorder, unspecified: Secondary | ICD-10-CM | POA: Diagnosis not present

## 2021-05-20 DIAGNOSIS — I129 Hypertensive chronic kidney disease with stage 1 through stage 4 chronic kidney disease, or unspecified chronic kidney disease: Secondary | ICD-10-CM | POA: Diagnosis not present

## 2021-05-20 DIAGNOSIS — N183 Chronic kidney disease, stage 3 unspecified: Secondary | ICD-10-CM | POA: Diagnosis not present

## 2021-05-25 DIAGNOSIS — D485 Neoplasm of uncertain behavior of skin: Secondary | ICD-10-CM | POA: Diagnosis not present

## 2021-05-25 DIAGNOSIS — D044 Carcinoma in situ of skin of scalp and neck: Secondary | ICD-10-CM | POA: Diagnosis not present

## 2021-05-25 DIAGNOSIS — L570 Actinic keratosis: Secondary | ICD-10-CM | POA: Diagnosis not present

## 2021-08-31 DIAGNOSIS — Z23 Encounter for immunization: Secondary | ICD-10-CM | POA: Diagnosis not present

## 2021-10-11 ENCOUNTER — Other Ambulatory Visit: Payer: Self-pay

## 2021-10-11 ENCOUNTER — Inpatient Hospital Stay (HOSPITAL_COMMUNITY)
Admission: EM | Admit: 2021-10-11 | Discharge: 2021-10-25 | DRG: 004 | Disposition: A | Discharge status: Skilled Nursing Facility | Payer: Medicare Other | Attending: Pulmonary Disease | Admitting: Pulmonary Disease

## 2021-10-11 ENCOUNTER — Inpatient Hospital Stay (HOSPITAL_COMMUNITY): Payer: Medicare Other

## 2021-10-11 ENCOUNTER — Emergency Department (HOSPITAL_COMMUNITY): Payer: Medicare Other

## 2021-10-11 ENCOUNTER — Encounter (HOSPITAL_COMMUNITY): Payer: Self-pay

## 2021-10-11 DIAGNOSIS — R0689 Other abnormalities of breathing: Secondary | ICD-10-CM | POA: Diagnosis not present

## 2021-10-11 DIAGNOSIS — R001 Bradycardia, unspecified: Secondary | ICD-10-CM | POA: Diagnosis not present

## 2021-10-11 DIAGNOSIS — Z880 Allergy status to penicillin: Secondary | ICD-10-CM

## 2021-10-11 DIAGNOSIS — F419 Anxiety disorder, unspecified: Secondary | ICD-10-CM | POA: Diagnosis present

## 2021-10-11 DIAGNOSIS — N179 Acute kidney failure, unspecified: Secondary | ICD-10-CM | POA: Diagnosis present

## 2021-10-11 DIAGNOSIS — F17210 Nicotine dependence, cigarettes, uncomplicated: Secondary | ICD-10-CM | POA: Diagnosis not present

## 2021-10-11 DIAGNOSIS — D638 Anemia in other chronic diseases classified elsewhere: Secondary | ICD-10-CM | POA: Diagnosis present

## 2021-10-11 DIAGNOSIS — E8770 Fluid overload, unspecified: Secondary | ICD-10-CM | POA: Diagnosis not present

## 2021-10-11 DIAGNOSIS — G928 Other toxic encephalopathy: Secondary | ICD-10-CM | POA: Diagnosis not present

## 2021-10-11 DIAGNOSIS — J9691 Respiratory failure, unspecified with hypoxia: Secondary | ICD-10-CM

## 2021-10-11 DIAGNOSIS — R4182 Altered mental status, unspecified: Secondary | ICD-10-CM | POA: Diagnosis not present

## 2021-10-11 DIAGNOSIS — R0602 Shortness of breath: Secondary | ICD-10-CM | POA: Diagnosis not present

## 2021-10-11 DIAGNOSIS — R748 Abnormal levels of other serum enzymes: Secondary | ICD-10-CM | POA: Diagnosis not present

## 2021-10-11 DIAGNOSIS — J9601 Acute respiratory failure with hypoxia: Secondary | ICD-10-CM | POA: Diagnosis present

## 2021-10-11 DIAGNOSIS — M1990 Unspecified osteoarthritis, unspecified site: Secondary | ICD-10-CM | POA: Diagnosis present

## 2021-10-11 DIAGNOSIS — F32A Depression, unspecified: Secondary | ICD-10-CM | POA: Diagnosis present

## 2021-10-11 DIAGNOSIS — Z452 Encounter for adjustment and management of vascular access device: Secondary | ICD-10-CM | POA: Diagnosis not present

## 2021-10-11 DIAGNOSIS — R739 Hyperglycemia, unspecified: Secondary | ICD-10-CM | POA: Diagnosis not present

## 2021-10-11 DIAGNOSIS — R000 Tachycardia, unspecified: Secondary | ICD-10-CM | POA: Diagnosis not present

## 2021-10-11 DIAGNOSIS — I10 Essential (primary) hypertension: Secondary | ICD-10-CM | POA: Diagnosis present

## 2021-10-11 DIAGNOSIS — T4275XA Adverse effect of unspecified antiepileptic and sedative-hypnotic drugs, initial encounter: Secondary | ICD-10-CM | POA: Diagnosis present

## 2021-10-11 DIAGNOSIS — A419 Sepsis, unspecified organism: Principal | ICD-10-CM | POA: Diagnosis present

## 2021-10-11 DIAGNOSIS — K761 Chronic passive congestion of liver: Secondary | ICD-10-CM | POA: Diagnosis present

## 2021-10-11 DIAGNOSIS — J90 Pleural effusion, not elsewhere classified: Secondary | ICD-10-CM | POA: Diagnosis not present

## 2021-10-11 DIAGNOSIS — Z4682 Encounter for fitting and adjustment of non-vascular catheter: Secondary | ICD-10-CM | POA: Diagnosis not present

## 2021-10-11 DIAGNOSIS — R4020 Unspecified coma: Secondary | ICD-10-CM | POA: Diagnosis not present

## 2021-10-11 DIAGNOSIS — J449 Chronic obstructive pulmonary disease, unspecified: Secondary | ICD-10-CM | POA: Diagnosis not present

## 2021-10-11 DIAGNOSIS — Z930 Tracheostomy status: Secondary | ICD-10-CM | POA: Diagnosis not present

## 2021-10-11 DIAGNOSIS — Z20822 Contact with and (suspected) exposure to covid-19: Secondary | ICD-10-CM | POA: Diagnosis present

## 2021-10-11 DIAGNOSIS — R410 Disorientation, unspecified: Secondary | ICD-10-CM | POA: Diagnosis not present

## 2021-10-11 DIAGNOSIS — Z7982 Long term (current) use of aspirin: Secondary | ICD-10-CM

## 2021-10-11 DIAGNOSIS — Z96641 Presence of right artificial hip joint: Secondary | ICD-10-CM | POA: Diagnosis present

## 2021-10-11 DIAGNOSIS — I5030 Unspecified diastolic (congestive) heart failure: Secondary | ICD-10-CM | POA: Diagnosis not present

## 2021-10-11 DIAGNOSIS — R0902 Hypoxemia: Secondary | ICD-10-CM | POA: Diagnosis not present

## 2021-10-11 DIAGNOSIS — L89151 Pressure ulcer of sacral region, stage 1: Secondary | ICD-10-CM | POA: Diagnosis present

## 2021-10-11 DIAGNOSIS — J432 Centrilobular emphysema: Secondary | ICD-10-CM | POA: Diagnosis not present

## 2021-10-11 DIAGNOSIS — J189 Pneumonia, unspecified organism: Secondary | ICD-10-CM | POA: Diagnosis present

## 2021-10-11 DIAGNOSIS — Z79899 Other long term (current) drug therapy: Secondary | ICD-10-CM

## 2021-10-11 DIAGNOSIS — I700 Atherosclerosis of aorta: Secondary | ICD-10-CM | POA: Diagnosis not present

## 2021-10-11 DIAGNOSIS — J9690 Respiratory failure, unspecified, unspecified whether with hypoxia or hypercapnia: Secondary | ICD-10-CM | POA: Diagnosis not present

## 2021-10-11 DIAGNOSIS — Z96651 Presence of right artificial knee joint: Secondary | ICD-10-CM | POA: Diagnosis present

## 2021-10-11 DIAGNOSIS — I959 Hypotension, unspecified: Secondary | ICD-10-CM | POA: Diagnosis not present

## 2021-10-11 DIAGNOSIS — F29 Unspecified psychosis not due to a substance or known physiological condition: Secondary | ICD-10-CM | POA: Diagnosis not present

## 2021-10-11 DIAGNOSIS — J9621 Acute and chronic respiratory failure with hypoxia: Secondary | ICD-10-CM | POA: Diagnosis not present

## 2021-10-11 DIAGNOSIS — K5730 Diverticulosis of large intestine without perforation or abscess without bleeding: Secondary | ICD-10-CM | POA: Diagnosis not present

## 2021-10-11 DIAGNOSIS — R451 Restlessness and agitation: Secondary | ICD-10-CM | POA: Diagnosis not present

## 2021-10-11 DIAGNOSIS — R0603 Acute respiratory distress: Secondary | ICD-10-CM | POA: Diagnosis not present

## 2021-10-11 DIAGNOSIS — R7989 Other specified abnormal findings of blood chemistry: Secondary | ICD-10-CM | POA: Diagnosis not present

## 2021-10-11 DIAGNOSIS — E785 Hyperlipidemia, unspecified: Secondary | ICD-10-CM | POA: Diagnosis present

## 2021-10-11 DIAGNOSIS — Z8669 Personal history of other diseases of the nervous system and sense organs: Secondary | ICD-10-CM | POA: Diagnosis not present

## 2021-10-11 DIAGNOSIS — N858 Other specified noninflammatory disorders of uterus: Secondary | ICD-10-CM | POA: Diagnosis not present

## 2021-10-11 DIAGNOSIS — L8990 Pressure ulcer of unspecified site, unspecified stage: Secondary | ICD-10-CM | POA: Insufficient documentation

## 2021-10-11 DIAGNOSIS — Z86711 Personal history of pulmonary embolism: Secondary | ICD-10-CM

## 2021-10-11 LAB — CBC WITH DIFFERENTIAL/PLATELET
Abs Immature Granulocytes: 1.36 K/uL — ABNORMAL HIGH (ref 0.00–0.07)
Basophils Absolute: 0.2 K/uL — ABNORMAL HIGH (ref 0.0–0.1)
Basophils Relative: 1 %
Eosinophils Absolute: 0 K/uL (ref 0.0–0.5)
Eosinophils Relative: 0 %
HCT: 42.8 % (ref 36.0–46.0)
Hemoglobin: 14.1 g/dL (ref 12.0–15.0)
Immature Granulocytes: 5 %
Lymphocytes Relative: 4 %
Lymphs Abs: 1.1 K/uL (ref 0.7–4.0)
MCH: 33.2 pg (ref 26.0–34.0)
MCHC: 32.9 g/dL (ref 30.0–36.0)
MCV: 100.7 fL — ABNORMAL HIGH (ref 80.0–100.0)
Monocytes Absolute: 0.9 K/uL (ref 0.1–1.0)
Monocytes Relative: 3 %
Neutro Abs: 24.9 K/uL — ABNORMAL HIGH (ref 1.7–7.7)
Neutrophils Relative %: 87 %
Platelets: 307 K/uL (ref 150–400)
RBC: 4.25 MIL/uL (ref 3.87–5.11)
RDW: 13.8 % (ref 11.5–15.5)
WBC: 28.4 K/uL — ABNORMAL HIGH (ref 4.0–10.5)
nRBC: 0.1 % (ref 0.0–0.2)

## 2021-10-11 LAB — URINALYSIS, ROUTINE W REFLEX MICROSCOPIC
Bacteria, UA: NONE SEEN
Bilirubin Urine: NEGATIVE
Glucose, UA: NEGATIVE mg/dL
Hgb urine dipstick: NEGATIVE
Ketones, ur: NEGATIVE mg/dL
Leukocytes,Ua: NEGATIVE
Nitrite: NEGATIVE
Protein, ur: 100 mg/dL — AB
Specific Gravity, Urine: 1.026 (ref 1.005–1.030)
pH: 5 (ref 5.0–8.0)

## 2021-10-11 LAB — LACTIC ACID, PLASMA
Lactic Acid, Venous: 2.9 mmol/L (ref 0.5–1.9)
Lactic Acid, Venous: 2.1 mmol/L (ref 0.5–1.9)

## 2021-10-11 LAB — COMPREHENSIVE METABOLIC PANEL
ALT: 83 U/L — ABNORMAL HIGH (ref 0–44)
AST: 92 U/L — ABNORMAL HIGH (ref 15–41)
Albumin: 2.3 g/dL — ABNORMAL LOW (ref 3.5–5.0)
Alkaline Phosphatase: 142 U/L — ABNORMAL HIGH (ref 38–126)
Anion gap: 13 (ref 5–15)
BUN: 44 mg/dL — ABNORMAL HIGH (ref 8–23)
CO2: 26 mmol/L (ref 22–32)
Calcium: 9.3 mg/dL (ref 8.9–10.3)
Chloride: 102 mmol/L (ref 98–111)
Creatinine, Ser: 1.29 mg/dL — ABNORMAL HIGH (ref 0.44–1.00)
GFR, Estimated: 44 mL/min — ABNORMAL LOW (ref >60)
Glucose, Bld: 160 mg/dL — ABNORMAL HIGH (ref 70–99)
Potassium: 2.9 mmol/L — ABNORMAL LOW (ref 3.5–5.1)
Sodium: 141 mmol/L (ref 135–145)
Total Bilirubin: 1.2 mg/dL (ref 0.3–1.2)
Total Protein: 7.1 g/dL (ref 6.5–8.1)

## 2021-10-11 LAB — BLOOD GAS, VENOUS
Acid-Base Excess: 3.7 mmol/L — ABNORMAL HIGH (ref 0.0–2.0)
Bicarbonate: 29.7 mmol/L — ABNORMAL HIGH (ref 20.0–28.0)
O2 Saturation: 56.8 %
Patient temperature: 37.3
pCO2, Ven: 50 mmHg (ref 44–60)
pH, Ven: 7.39 (ref 7.25–7.43)
pO2, Ven: 32 mmHg (ref 32–45)

## 2021-10-11 LAB — RESP PANEL BY RT-PCR (FLU A&B, COVID) ARPGX2
Influenza A by PCR: NEGATIVE
Influenza B by PCR: NEGATIVE
SARS Coronavirus 2 by RT PCR: NEGATIVE

## 2021-10-11 LAB — APTT: aPTT: 28 s (ref 24–36)

## 2021-10-11 LAB — CBG MONITORING, ED: Glucose-Capillary: 152 mg/dL — ABNORMAL HIGH (ref 70–99)

## 2021-10-11 LAB — PROTIME-INR
INR: 1.3 — ABNORMAL HIGH (ref 0.8–1.2)
Prothrombin Time: 16.1 s — ABNORMAL HIGH (ref 11.4–15.2)

## 2021-10-11 MED ORDER — MAGNESIUM OXIDE -MG SUPPLEMENT 400 (240 MG) MG PO TABS
800.0000 mg | ORAL_TABLET | Freq: Once | ORAL | Status: AC
  Administered 2021-10-11: 800 mg via ORAL
  Filled 2021-10-11: qty 2

## 2021-10-11 MED ORDER — IPRATROPIUM BROMIDE 0.02 % IN SOLN: 0.5000 mg | RESPIRATORY_TRACT | Status: DC | PRN

## 2021-10-11 MED ORDER — VANCOMYCIN HCL 750 MG/150ML IV SOLN
750.0000 mg | INTRAVENOUS | Status: DC
  Filled 2021-10-11: qty 150

## 2021-10-11 MED ORDER — LACTATED RINGERS IV BOLUS
1000.0000 mL | Freq: Once | INTRAVENOUS | Status: AC
  Administered 2021-10-11: 1000 mL via INTRAVENOUS

## 2021-10-11 MED ORDER — SODIUM CHLORIDE 0.9 % IV SOLN
2.0000 g | Freq: Two times a day (BID) | INTRAVENOUS | Status: AC
  Administered 2021-10-12 – 2021-10-16 (×10): 2 g via INTRAVENOUS
  Filled 2021-10-11 (×10): qty 2

## 2021-10-11 MED ORDER — METRONIDAZOLE 500 MG/100ML IV SOLN
500.0000 mg | Freq: Once | INTRAVENOUS | Status: AC
  Administered 2021-10-11: 500 mg via INTRAVENOUS
  Filled 2021-10-11: qty 100

## 2021-10-11 MED ORDER — POTASSIUM CHLORIDE CRYS ER 20 MEQ PO TBCR
40.0000 meq | EXTENDED_RELEASE_TABLET | Freq: Once | ORAL | Status: AC
  Administered 2021-10-11: 40 meq via ORAL
  Filled 2021-10-11: qty 2

## 2021-10-11 MED ORDER — SODIUM CHLORIDE 0.9 % IV SOLN
2.0000 g | Freq: Once | INTRAVENOUS | Status: AC
  Administered 2021-10-11: 2 g via INTRAVENOUS
  Filled 2021-10-11: qty 2

## 2021-10-11 MED ORDER — DOCUSATE SODIUM 100 MG PO CAPS: 100.0000 mg | ORAL_CAPSULE | Freq: Two times a day (BID) | ORAL | Status: DC | PRN

## 2021-10-11 MED ORDER — ENOXAPARIN SODIUM 40 MG/0.4ML IJ SOSY
40.0000 mg | PREFILLED_SYRINGE | INTRAMUSCULAR | Status: DC
  Administered 2021-10-11 – 2021-10-20 (×10): 40 mg via SUBCUTANEOUS
  Filled 2021-10-11 (×11): qty 0.4

## 2021-10-11 MED ORDER — ALBUTEROL SULFATE (2.5 MG/3ML) 0.083% IN NEBU: 2.5000 mg | INHALATION_SOLUTION | RESPIRATORY_TRACT | Status: DC | PRN

## 2021-10-11 MED ORDER — VANCOMYCIN HCL IN DEXTROSE 1-5 GM/200ML-% IV SOLN
1000.0000 mg | Freq: Once | INTRAVENOUS | Status: AC
  Administered 2021-10-11: 1000 mg via INTRAVENOUS
  Filled 2021-10-11: qty 200

## 2021-10-11 MED ORDER — PANTOPRAZOLE SODIUM 40 MG IV SOLR
40.0000 mg | Freq: Every day | INTRAVENOUS | Status: DC
  Administered 2021-10-11: 40 mg via INTRAVENOUS
  Filled 2021-10-11: qty 10

## 2021-10-11 MED ORDER — ENOXAPARIN SODIUM 40 MG/0.4ML IJ SOSY: 40.0000 mg | PREFILLED_SYRINGE | INTRAMUSCULAR | Status: DC

## 2021-10-11 MED ORDER — LORAZEPAM 2 MG/ML IJ SOLN
0.5000 mg | Freq: Once | INTRAMUSCULAR | Status: AC
  Administered 2021-10-11: 0.5 mg via INTRAVENOUS
  Filled 2021-10-11: qty 1

## 2021-10-11 MED ORDER — IOHEXOL 350 MG/ML SOLN
100.0000 mL | Freq: Once | INTRAVENOUS | Status: AC | PRN
  Administered 2021-10-11: 80 mL via INTRAVENOUS

## 2021-10-11 MED ORDER — METHYLPREDNISOLONE SODIUM SUCC 40 MG IJ SOLR
40.0000 mg | Freq: Three times a day (TID) | INTRAMUSCULAR | Status: DC
  Administered 2021-10-11 – 2021-10-15 (×11): 40 mg via INTRAVENOUS
  Filled 2021-10-11 (×11): qty 1

## 2021-10-11 MED ORDER — ONDANSETRON HCL 4 MG/2ML IJ SOLN: 4.0000 mg | Freq: Four times a day (QID) | INTRAMUSCULAR | Status: DC | PRN

## 2021-10-11 MED ORDER — POLYETHYLENE GLYCOL 3350 17 G PO PACK: 17.0000 g | PACK | Freq: Every day | ORAL | Status: DC | PRN

## 2021-10-11 MED ORDER — ACETAMINOPHEN 500 MG PO TABS
1000.0000 mg | ORAL_TABLET | Freq: Once | ORAL | Status: AC
  Administered 2021-10-11: 1000 mg via ORAL
  Filled 2021-10-11: qty 2

## 2021-10-11 MED ORDER — DEXMEDETOMIDINE HCL IN NACL 200 MCG/50ML IV SOLN
0.4000 ug/kg/h | INTRAVENOUS | Status: DC
  Administered 2021-10-11: 0.4 ug/kg/h via INTRAVENOUS
  Filled 2021-10-11: qty 50

## 2021-10-11 NOTE — ED Triage Notes (Signed)
Pt BIB EMS from home. Per EMS pt's son came in the house and found the pt sitting on the couch w/ dry urine and feces. Per EMS pt is lethargic, tachypneic, and hot to touch. Pt also has boils on stomach. ? ?A&Ox4 ?97.9 F ?130 hr ?134/70 ?Cbg 105 ?50 rr ? ?

## 2021-10-11 NOTE — H&P (Signed)
? ?NAME:  Gina Petty, MRN:  [id number], DOB:  10-17-1945, LOS: 0 ?ADMISSION DATE:  10/11/2021,  ?REFERRING MD: Emergency room, CHIEF COMPLAINT: Acute respiratory distress ? ?History of Present Illness:  ?This is a 76 year old white female that presented to [facility] emergency room from home.  Patient states that she had had symptoms that started approximately 1 week ago that slowly progressed most significantly over the past 96 hours.  She was originally only mildly shortness of breath with dyspnea on exertion but now is significantly dyspneic at rest.  During this period time she has had worsening cough generally nonproductive but does bring up sputum intermittently.  She denies any chest pressure but does have reproducible bilateral chest pain that does not have a radiculopathy component.  Positive fevers over past 72 hours.  Patient now states that she has nausea but no vomiting.  Positive diarrhea over the past 4 days.  On arrival to the emergency room patient required placement on BiPAP. ? ?Pertinent  Medical History  ?Hyperlipidemia  ?HTN ?Depression ?37 pyhx smoker ? ?Significant Hospital Events: ?Including procedures, antibiotic start and stop dates in addition to other pertinent events   ? ? ? ? ?Objective   ?Blood pressure 104/61, pulse (!) 113, temperature 99.6 ?F (37.6 ?C), temperature source Axillary, resp. rate (!) 30, height '5\' 6"'$  (1.676 m), weight 88.6 kg, SpO2 94 %. ?   ?   ?No intake or output data in the 24 hours ending 10/11/21 2259 ?Filed Weights  ? 10/11/21 1906  ?Weight: 88.6 kg  ? ? ?Examination: ?General: No acute distress but is tachypneic. ?HENT: Atraumatic/normocephalic mucous membranes are moist ?Lungs: Moderate amount of rhonchi bilaterally.  Intermittent scattered wheeze. ?Cardiovascular: Tachycardic, no rub murmur or gallop appreciated. ?Abdomen: Soft, nontender, nondistended, no rebound/rigidity/guarding, positive bowel sounds ?Extremities: + clubbing, no edema or cyanosis.  Distal  pulses intact x4 ?Neuro: awake and alert.  Follows commands x4 extrem ?GU: No Foley catheter ? ? ?Assessment & Plan:  ?Acute hypoxic respiratory failure ?Community associated pneumonia ?Significant prior smoking history ?Acute kidney injury ? ? ?Plan: ?Patient was admitted to the intensive care unit for further work-up. ? ?Pulmonary: Patient was placed on BiPAP.  Patient appears comfortable but remains tachypneic.  Patient is at high risk for intubation.  Add Solu-Medrol 40 mg IV every 8 hours. ?Cardio: Currently the patient will require vasoactive support.  We will continue to monitor noninvasive hemodynamics. ?GI: N.p.o. at this time.  Once respiratory status improves would consider starting clear liquids and advancing as tolerated.  Protonix 40 mg IV every 24 hours for GI prophylaxis ?Renal: Closely monitor I's/O's.  Electrolyte protocol was initiated.  Replete potassium per protocol ?ID: Patient was started on broad-spectrum antibiotics cultures are pending at this time. ?Heme: We will transfuse for hemoglobin less than 7. ? ?Family: Updated patient's nephew at bedside.  Answered all questions. ?Best Practice (right click and "Reselect all SmartList Selections" daily)  ? ?Diet/type: NPO ?DVT prophylaxis: LMWH ?GI prophylaxis: PPI ?Lines: N/A ?Foley:  N/A ?Code Status:  full code ?Last date of multidisciplinary goals of care discussion '[]'$  ? ?Labs   ?CBC: ?Recent Labs  ?Lab 10/11/21 ?1900  ?WBC 28.4*  ?NEUTROABS 24.9*  ?HGB 14.1  ?HCT 42.8  ?MCV 100.7*  ?PLT 307  ? ? ?Basic Metabolic Panel: ?Recent Labs  ?Lab 10/11/21 ?1900  ?NA 141  ?K 2.9*  ?CL 102  ?CO2 26  ?GLUCOSE 160*  ?BUN 44*  ?CREATININE 1.29*  ?CALCIUM 9.3  ? ?GFR: ?  Estimated Creatinine Clearance: 43.5 mL/min (A) (by C-G formula based on SCr of 1.29 mg/dL (H)). ?Recent Labs  ?Lab 10/11/21 ?1900 10/11/21 ?2129  ?WBC 28.4*  --   ?LATICACIDVEN 2.9* 2.1*  ? ? ?Liver Function Tests: ?Recent Labs  ?Lab 10/11/21 ?1900  ?AST 92*  ?ALT 83*  ?ALKPHOS 142*   ?BILITOT 1.2  ?PROT 7.1  ?ALBUMIN 2.3*  ? ?No results for input(s): LIPASE, AMYLASE in the last 168 hours. ?No results for input(s): AMMONIA in the last 168 hours. ? ?ABG ?   ?Component Value Date/Time  ? HCO3 29.7 (H) 10/11/2021 2227  ? O2SAT 56.8 10/11/2021 2227  ?  ? ?Coagulation Profile: ?Recent Labs  ?Lab 10/11/21 ?1900  ?INR 1.3*  ? ? ?Cardiac Enzymes: ?No results for input(s): CKTOTAL, CKMB, CKMBINDEX, TROPONINI in the last 168 hours. ? ?HbA1C: ?No results found for: HGBA1C ? ?CBG: ?Recent Labs  ?Lab 10/11/21 ?1853  ?GLUCAP 152*  ? ? ?Review of Systems:   ?See HPI ? ?Past Medical History:  ?She,  has a past medical history of Arthritis, Depression, Hypertension, and PE (pulmonary embolism).  ? ?Surgical History:  ? ?Past Surgical History:  ?Procedure Laterality Date  ? BACK SURGERY  85  ? CATARACT EXTRACTION    ? COLONOSCOPY  06/12/2006 last   ? POLYPECTOMY    ? TOTAL HIP ARTHROPLASTY Right 10/08/2019  ? Procedure: RIGHT TOTAL HIP ARTHROPLASTY ANTERIOR APPROACH;  Surgeon: Melrose Nakayama, MD;  Location: [facility];  Service: Orthopedics;  Laterality: Right;  ? TOTAL KNEE ARTHROPLASTY Right 11/26/2013  ? DR DALLDORF  ? TOTAL KNEE ARTHROPLASTY Right 11/26/2013  ? Procedure: TOTAL KNEE ARTHROPLASTY;  Surgeon: [name], MD;  Location: [place];  Service: Orthopedics;  Laterality: Right;  Right total knee arthroplasty  ?  ? ?Social History:  ? reports that she quit smoking about 20 years ago. Her smoking use included cigarettes. She has a 25.00 pack-year smoking history. She has never used smokeless tobacco. She reports current alcohol use of about 7.0 standard drinks per week. She reports that she does not use drugs.  ? ?Family History:  ?Her family history includes Colon cancer (age of onset: 73) in her brother. There is no history of Colon polyps, Esophageal cancer, Prostate cancer, or Rectal cancer.  ? ?Allergies ?Allergies  ?Allergen Reactions  ? Penicillins Rash  ?  Details not specified ?Tolerates Ancef  ?   ? ?Home Medications  ?Prior to Admission medications   ?Medication Sig Start Date End Date Taking? Authorizing Provider  ?Ascorbic Acid (VITAMIN C) 500 MG CHEW Chew 500 mg by mouth daily.    [provider]  ?aspirin 81 MG chewable tablet Chew 1 tablet (81 mg total) by mouth 2 (two) times daily. 10/09/19   Loni Dolly, PA-C  ?b complex vitamins tablet Take 1 tablet by mouth daily.    [provider]  ?buPROPion (WELLBUTRIN XL) 150 MG 24 hr tablet Take 150 mg by mouth daily.    [provider]  ?CANNABIDIOL PO Take 1 tablet by mouth daily as needed (pain.). CBD GUMMIES    [provider]  ?diclofenac Sodium (VOLTAREN) 1 % GEL Apply 2 g topically 3 (three) times daily as needed (pain).    [provider]  ?diphenhydramine-acetaminophen (TYLENOL PM) 25-500 MG TABS tablet Take 1-2 tablets by mouth at bedtime as needed (pain).     [provider]  ?HYDROcodone-acetaminophen (NORCO/VICODIN) 5-325 MG tablet Take 1 tablet by mouth every 6 (six) hours as needed  for moderate pain or severe pain (post op pain). 10/09/19   Loni Dolly, PA-C  ?Multiple Vitamin (MULTIVITAMIN WITH MINERALS) TABS tablet Take 1 tablet by mouth daily.    [provider]  ?triamterene-hydrochlorothiazide (MAXZIDE-25) 37.5-25 MG per tablet Take 1 tablet by mouth daily.    [provider]  ?  ? ?Critical care time: 55 minutes ?  ? ? ? ?  ?

## 2021-10-11 NOTE — Progress Notes (Signed)
Pt transported to CT via bipap with no complications. ?

## 2021-10-11 NOTE — Progress Notes (Signed)
A consult was received from an ED physician for vancomycin, cefepime, and Flagyl per pharmacy dosing (for an indication other than meningitis). The patient's profile has been reviewed for ht/wt/allergies/indication/available labs. A one time order has been placed for the above antibiotics.  Further antibiotics/pharmacy consults should be ordered by admitting physician if indicated.       ?                ?Reuel Boom, PharmD, BCPS ?[phone] ?10/11/2021, 7:50 PM ? ?

## 2021-10-11 NOTE — ED Provider Notes (Signed)
?[facility] DEPT ?Provider Note ? ?CSN: [id number] ?Arrival date & time: 10/11/21 1841 ? ?Chief Complaint(s) ?No chief complaint on file. ? ?HPI ?Gina Petty is a 76 y.o. female with PMH hypertension, pulmonary embolism (?)  Not on current anticoagulation who presents emergency department for evaluation of altered mental status.  History limited for EMS and myself who states that the patient was found down in urine and feces at home, tachypneic and hypoxic.  Patient arrives hypoxic to 80% on room air and tachypneic in the 40s.  Tachycardic in the 130s and 140s and warm to the touch.  Additional review of systems unable to be obtained due to patient's mental status. ? ?HPI ? ?Past Medical History ?Past Medical History:  ?Diagnosis Date  ? Arthritis   ? Depression   ? Hypertension   ? PE (pulmonary embolism)   ? posssible 74  not able to prove  ? ?Patient Active Problem List  ? Diagnosis Date Noted  ? CAP (community acquired pneumonia) 10/11/2021  ? Primary osteoarthritis of right hip 10/08/2019  ? Hyperlipidemia 12/12/2013  ? Essential hypertension, benign 12/03/2013  ? Depressive disorder, not elsewhere classified 12/03/2013  ? Acute posthemorrhagic anemia 12/03/2013  ? 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 ?Home Medication(s) ?Prior to Admission medications   ?Medication Sig Start Date End Date Taking? Authorizing Provider  ?Ascorbic Acid (VITAMIN C) 500 MG CHEW Chew 500 mg by mouth daily.    [provider]  ?aspirin 81 MG chewable tablet Chew 1 tablet (81 mg total) by mouth 2 (two) times daily. 10/09/19   Loni Dolly, PA-C  ?b complex vitamins tablet Take 1 tablet by mouth daily.    [provider]  ?buPROPion (WELLBUTRIN XL) 150 MG 24 hr tablet Take 150 mg by mouth daily.    [provider]  ?CANNABIDIOL PO Take 1 tablet by mouth daily as needed (pain.). CBD GUMMIES    [provider]  ?diclofenac Sodium (VOLTAREN) 1 % GEL Apply 2 g topically 3 (three)  times daily as needed (pain).    [provider]  ?diphenhydramine-acetaminophen (TYLENOL PM) 25-500 MG TABS tablet Take 1-2 tablets by mouth at bedtime as needed (pain).     [provider]  ?HYDROcodone-acetaminophen (NORCO/VICODIN) 5-325 MG tablet Take 1 tablet by mouth every 6 (six) hours as needed for moderate pain or severe pain (post op pain). 10/09/19   Loni Dolly, PA-C  ?Multiple Vitamin (MULTIVITAMIN WITH MINERALS) TABS tablet Take 1 tablet by mouth daily.    [provider]  ?triamterene-hydrochlorothiazide (MAXZIDE-25) 37.5-25 MG per tablet Take 1 tablet by mouth daily.    [provider]  ?                                                                                                                                  ?Past Surgical History ?Past Surgical History:  ?Procedure Laterality Date  ? BACK  SURGERY  85  ? CATARACT EXTRACTION    ? COLONOSCOPY  06/12/2006 last   ? POLYPECTOMY    ? TOTAL HIP ARTHROPLASTY Right 10/08/2019  ? Procedure: RIGHT TOTAL HIP ARTHROPLASTY ANTERIOR APPROACH;  Surgeon: Melrose Nakayama, MD;  Location: [facility];  Service: Orthopedics;  Laterality: Right;  ? TOTAL KNEE ARTHROPLASTY Right 11/26/2013  ? DR DALLDORF  ? TOTAL KNEE ARTHROPLASTY Right 11/26/2013  ? Procedure: TOTAL KNEE ARTHROPLASTY;  Surgeon: [name], MD;  Location: [facility];  Service: Orthopedics;  Laterality: Right;  Right total knee arthroplasty  ? ?Family History ?Family History  ?Problem Relation Age of Onset  ? Colon cancer Brother 74  ? Colon polyps Neg Hx   ? Esophageal cancer Neg Hx   ? Prostate cancer Neg Hx   ? Rectal cancer Neg Hx   ? ? ?Social History ?Social History  ? ?Tobacco Use  ? Smoking status: Former  ?  Packs/day: 1.00  ?  Years: 25.00  ?  Pack years: 25.00  ?  Types: Cigarettes  ?  Quit date: 11/19/2000  ?  Years since quitting: 20.9  ? Smokeless tobacco: Never  ?Vaping Use  ? Vaping Use: Never used  ?Substance Use Topics  ? Alcohol use: Yes  ?  Alcohol/week:  7.0 standard drinks  ?  Types: 7 Glasses of wine per week  ?  Comment: DAILY  ? Drug use: No  ? ?Allergies ?Penicillins ? ?Review of Systems ?Review of Systems  ?Unable to perform ROS: Mental status change  ? ?Physical Exam ?Vital Signs  ?I have reviewed the triage vital signs ?BP (!) 128/53   Pulse (!) 110   Temp 99.6 ?F (37.6 ?C) (Axillary)   Resp (!) 34   Ht '5\' 6"'$  (1.676 m)   Wt 88.6 kg   SpO2 95%   BMI 31.53 kg/m?  ? ?Physical Exam ?Vitals and nursing note reviewed.  ?Constitutional:   ?   General: She is in acute distress.  ?   Appearance: She is well-developed. She is ill-appearing.  ?HENT:  ?   Head: Normocephalic and atraumatic.  ?Eyes:  ?   Conjunctiva/sclera: Conjunctivae normal.  ?Cardiovascular:  ?   Rate and Rhythm: Regular rhythm. Tachycardia present.  ?   Heart sounds: No murmur heard. ?Pulmonary:  ?   Effort: Respiratory distress present.  ?   Breath sounds: Rales present.  ?Abdominal:  ?   Palpations: Abdomen is soft.  ?   Tenderness: There is no abdominal tenderness.  ?Musculoskeletal:     ?   General: No swelling.  ?   Cervical back: Neck supple.  ?Skin: ?   General: Skin is warm and dry.  ?   Capillary Refill: Capillary refill takes less than 2 seconds.  ?Neurological:  ?   Mental Status: She is alert. She is disoriented.  ?Psychiatric:     ?   Mood and Affect: Mood normal.  ? ? ?ED Results and Treatments ?Labs ?(all labs ordered are listed, but only abnormal results are displayed) ?Labs Reviewed  ?LACTIC ACID, PLASMA - Abnormal; Notable for the following components:  ?    Result Value  ? Lactic Acid, Venous 2.9 (*)   ? All other components within normal limits  ?LACTIC ACID, PLASMA - Abnormal; Notable for the following components:  ? Lactic Acid, Venous 2.1 (*)   ? All other components within normal limits  ?COMPREHENSIVE METABOLIC PANEL - Abnormal; Notable for the following components:  ? Potassium 2.9 (*)   ?  Glucose, Bld 160 (*)   ? BUN 44 (*)   ? Creatinine, Ser 1.29 (*)   ? Albumin  2.3 (*)   ? AST 92 (*)   ? ALT 83 (*)   ? Alkaline Phosphatase 142 (*)   ? GFR, Estimated 44 (*)   ? All other components within normal limits  ?CBC WITH DIFFERENTIAL/PLATELET - Abnormal; Notable for the following components:  ? WBC 28.4 (*)   ? MCV 100.7 (*)   ? Neutro Abs 24.9 (*)   ? Basophils Absolute 0.2 (*)   ? Abs Immature Granulocytes 1.36 (*)   ? All other components within normal limits  ?PROTIME-INR - Abnormal; Notable for the following components:  ? Prothrombin Time 16.1 (*)   ? INR 1.3 (*)   ? All other components within normal limits  ?URINALYSIS, ROUTINE W REFLEX MICROSCOPIC - Abnormal; Notable for the following components:  ? Color, Urine AMBER (*)   ? APPearance HAZY (*)   ? Protein, ur 100 (*)   ? Non Squamous Epithelial 0-5 (*)   ? All other components within normal limits  ?BLOOD GAS, VENOUS - Abnormal; Notable for the following components:  ? Bicarbonate 29.7 (*)   ? Acid-Base Excess 3.7 (*)   ? All other components within normal limits  ?CBG MONITORING, ED - Abnormal; Notable for the following components:  ? Glucose-Capillary 152 (*)   ? All other components within normal limits  ?RESP PANEL BY RT-PCR (FLU A&B, COVID) ARPGX2  ?CULTURE, BLOOD (ROUTINE X 2)  ?CULTURE, BLOOD (ROUTINE X 2)  ?URINE CULTURE  ?RESPIRATORY PANEL BY PCR  ?APTT  ?CBC  ?BASIC METABOLIC PANEL  ?MAGNESIUM  ?PHOSPHORUS  ?PROCALCITONIN  ?CREATININE, SERUM  ?                                                                                                                       ? ?Radiology ?CT Angio Chest PE W and/or Wo Contrast ? ?Result Date: 10/11/2021 ?CLINICAL DATA:  Short of breath, lethargy, tachypnea, sepsis EXAM: CT ANGIOGRAPHY CHEST CT ABDOMEN AND PELVIS WITH CONTRAST TECHNIQUE: Multidetector CT imaging of the chest was performed using the standard protocol during bolus administration of intravenous contrast. Multiplanar CT image reconstructions and MIPs were obtained to evaluate the vascular anatomy. Multidetector CT  imaging of the abdomen and pelvis was performed using the standard protocol during bolus administration of intravenous contrast. RADIATION DOSE REDUCTION: This exam was performed according to the departmental d

## 2021-10-11 NOTE — Progress Notes (Signed)
Pharmacy Antibiotic Note ? ?Gina Petty is a 76 y.o. female admitted on 10/11/2021 with sepsis.  Pharmacy has been consulted for Vancomycin and Cefepime dosing. ? ?Patient received Vancomycin 1gm IV, Cefepime 2gm IV and Metronidazole '500mg'$  IV x 1 dose each in the ED ? ?Plan: ?Cefepime 2gm IV q12h ?Vancomycin 750 mg IV Q 24 hrs. Goal AUC 400-550.  Expected AUC: 489.6   SCr used: 1.29 ?Follow renal function ?F/u culture results and sensitivities ? ?Height: '5\' 6"'$  (167.6 cm) ?Weight: 88.6 kg (195 lb 5.2 oz) ?IBW/kg (Calculated) : 59.3 ? ?Temp (24hrs), Avg:101.1 ?F (38.4 ?C), Min:99.6 ?F (37.6 ?C), Max:102.6 ?F (39.2 ?C) ? ?Recent Labs  ?Lab 10/11/21 ?1900 10/11/21 ?2129  ?WBC 28.4*  --   ?CREATININE 1.29*  --   ?LATICACIDVEN 2.9* 2.1*  ?  ?Estimated Creatinine Clearance: 43.5 mL/min (A) (by C-G formula based on SCr of 1.29 mg/dL (H)).   ? ?Allergies  ?Allergen Reactions  ? Penicillins Rash  ?  Details not specified ?Tolerates Ancef  ? ? ?Antimicrobials this admission: ?3/27 Metronidazole x 1 ?3/27 Cefepime >>   ?3/27 Vancomycin >>   ? ?Dose adjustments this admission: ?  ? ?Microbiology results: ?3/27 BCx:   ?3/27 UCx:    ?  ? ?Thank you for allowing pharmacy to be a part of this patient?s care. ? ?Everette Rank, PharmD ?10/11/2021 11:26 PM ? ?

## 2021-10-12 ENCOUNTER — Inpatient Hospital Stay (HOSPITAL_COMMUNITY): Payer: Medicare Other

## 2021-10-12 DIAGNOSIS — J189 Pneumonia, unspecified organism: Secondary | ICD-10-CM

## 2021-10-12 LAB — BODY FLUID CELL COUNT WITH DIFFERENTIAL: Total Nucleated Cell Count, Fluid: 760 cu mm (ref 0–1000)

## 2021-10-12 LAB — BASIC METABOLIC PANEL
Anion gap: 9 (ref 5–15)
BUN: 48 mg/dL — ABNORMAL HIGH (ref 8–23)
CO2: 24 mmol/L (ref 22–32)
Calcium: 8.5 mg/dL — ABNORMAL LOW (ref 8.9–10.3)
Chloride: 106 mmol/L (ref 98–111)
Creatinine, Ser: 1.2 mg/dL — ABNORMAL HIGH (ref 0.44–1.00)
GFR, Estimated: 48 mL/min — ABNORMAL LOW (ref >60)
Glucose, Bld: 147 mg/dL — ABNORMAL HIGH (ref 70–99)
Potassium: 3.6 mmol/L (ref 3.5–5.1)
Sodium: 139 mmol/L (ref 135–145)

## 2021-10-12 LAB — BLOOD GAS, ARTERIAL
Acid-Base Excess: 0.6 mmol/L (ref 0.0–2.0)
Bicarbonate: 28.7 mmol/L — ABNORMAL HIGH (ref 20.0–28.0)
Drawn by: 25770
FIO2: 60 %
MECHVT: 470 mL
O2 Saturation: 92.3 %
PEEP: 5 cmH2O
Patient temperature: 37.6
RATE: 18 resp/min
pCO2 arterial: 59 mmHg — ABNORMAL HIGH (ref 32–48)
pH, Arterial: 7.3 — ABNORMAL LOW (ref 7.35–7.45)
pO2, Arterial: 70 mmHg — ABNORMAL LOW (ref 83–108)

## 2021-10-12 LAB — GLUCOSE, CAPILLARY
Glucose-Capillary: 154 mg/dL — ABNORMAL HIGH (ref 70–99)
Glucose-Capillary: 190 mg/dL — ABNORMAL HIGH (ref 70–99)
Glucose-Capillary: 191 mg/dL — ABNORMAL HIGH (ref 70–99)

## 2021-10-12 LAB — MRSA NEXT GEN BY PCR, NASAL: MRSA by PCR Next Gen: NOT DETECTED

## 2021-10-12 LAB — CBC
HCT: 36.4 % (ref 36.0–46.0)
Hemoglobin: 11.6 g/dL — ABNORMAL LOW (ref 12.0–15.0)
MCH: 33.3 pg (ref 26.0–34.0)
MCHC: 31.9 g/dL (ref 30.0–36.0)
MCV: 104.6 fL — ABNORMAL HIGH (ref 80.0–100.0)
Platelets: 243 10*3/uL (ref 150–400)
RBC: 3.48 MIL/uL — ABNORMAL LOW (ref 3.87–5.11)
RDW: 13.9 % (ref 11.5–15.5)
WBC: 22.3 10*3/uL — ABNORMAL HIGH (ref 4.0–10.5)
nRBC: 0.1 % (ref 0.0–0.2)

## 2021-10-12 LAB — MAGNESIUM
Magnesium: 2.6 mg/dL — ABNORMAL HIGH (ref 1.7–2.4)
Magnesium: 2.8 mg/dL — ABNORMAL HIGH (ref 1.7–2.4)

## 2021-10-12 LAB — PHOSPHORUS
Phosphorus: 4.5 mg/dL (ref 2.5–4.6)
Phosphorus: 4.5 mg/dL (ref 2.5–4.6)

## 2021-10-12 LAB — CREATININE, SERUM
Creatinine, Ser: 1.24 mg/dL — ABNORMAL HIGH (ref 0.44–1.00)
GFR, Estimated: 46 mL/min — ABNORMAL LOW (ref >60)

## 2021-10-12 LAB — PROCALCITONIN: Procalcitonin: 4.86 ng/mL

## 2021-10-12 LAB — BRAIN NATRIURETIC PEPTIDE: B Natriuretic Peptide: 265.8 pg/mL — ABNORMAL HIGH (ref 0.0–100.0)

## 2021-10-12 MED ORDER — PHENYLEPHRINE 40 MCG/ML (10ML) SYRINGE FOR IV PUSH (FOR BLOOD PRESSURE SUPPORT)
PREFILLED_SYRINGE | INTRAVENOUS | Status: AC
  Filled 2021-10-12: qty 10

## 2021-10-12 MED ORDER — FENTANYL BOLUS VIA INFUSION
25.0000 ug | INTRAVENOUS | Status: DC | PRN
  Administered 2021-10-13: 75 ug via INTRAVENOUS
  Administered 2021-10-13 (×2): 100 ug via INTRAVENOUS
  Administered 2021-10-13 (×2): 75 ug via INTRAVENOUS
  Administered 2021-10-13 (×2): 100 ug via INTRAVENOUS
  Administered 2021-10-13: 50 ug via INTRAVENOUS
  Administered 2021-10-14 (×8): 100 ug via INTRAVENOUS
  Administered 2021-10-14: 50 ug via INTRAVENOUS
  Administered 2021-10-14 – 2021-10-16 (×14): 100 ug via INTRAVENOUS
  Administered 2021-10-16: 75 ug via INTRAVENOUS
  Administered 2021-10-16 – 2021-10-17 (×5): 100 ug via INTRAVENOUS
  Administered 2021-10-17: 50 ug via INTRAVENOUS
  Administered 2021-10-17 (×2): 100 ug via INTRAVENOUS
  Administered 2021-10-18: 50 ug via INTRAVENOUS
  Administered 2021-10-18 (×3): 100 ug via INTRAVENOUS
  Administered 2021-10-19 (×5): 50 ug via INTRAVENOUS
  Administered 2021-10-19: 25 ug via INTRAVENOUS
  Administered 2021-10-19: 100 ug via INTRAVENOUS
  Administered 2021-10-19: 50 ug via INTRAVENOUS
  Filled 2021-10-12: qty 100

## 2021-10-12 MED ORDER — FENTANYL CITRATE (PF) 100 MCG/2ML IJ SOLN
INTRAMUSCULAR | Status: AC
  Administered 2021-10-12: 100 ug
  Filled 2021-10-12: qty 2

## 2021-10-12 MED ORDER — CHLORHEXIDINE GLUCONATE 0.12% ORAL RINSE (MEDLINE KIT)
15.0000 mL | Freq: Two times a day (BID) | OROMUCOSAL | Status: DC
  Administered 2021-10-12 (×2): 15 mL via OROMUCOSAL

## 2021-10-12 MED ORDER — DEXMEDETOMIDINE HCL IN NACL 200 MCG/50ML IV SOLN
0.0000 ug/kg/h | INTRAVENOUS | Status: DC
  Administered 2021-10-12: 0.4 ug/kg/h via INTRAVENOUS
  Administered 2021-10-12: 1 ug/kg/h via INTRAVENOUS
  Filled 2021-10-12 (×2): qty 50

## 2021-10-12 MED ORDER — VITAL HIGH PROTEIN PO LIQD
1000.0000 mL | ORAL | Status: AC
  Administered 2021-10-12: 1000 mL

## 2021-10-12 MED ORDER — ROCURONIUM BROMIDE 10 MG/ML (PF) SYRINGE
PREFILLED_SYRINGE | INTRAVENOUS | Status: AC
  Administered 2021-10-12: 100 mg
  Filled 2021-10-12: qty 10

## 2021-10-12 MED ORDER — MIDAZOLAM HCL 2 MG/2ML IJ SOLN
1.0000 mg | INTRAMUSCULAR | Status: AC | PRN
  Administered 2021-10-12 (×3): 1 mg via INTRAVENOUS
  Filled 2021-10-12 (×2): qty 2

## 2021-10-12 MED ORDER — ORAL CARE MOUTH RINSE
15.0000 mL | Freq: Two times a day (BID) | OROMUCOSAL | Status: DC
  Administered 2021-10-12 (×2): 15 mL via OROMUCOSAL

## 2021-10-12 MED ORDER — LACTATED RINGERS IV SOLN: INTRAVENOUS | Status: AC

## 2021-10-12 MED ORDER — CHLORHEXIDINE GLUCONATE CLOTH 2 % EX PADS
6.0000 | MEDICATED_PAD | Freq: Every day | CUTANEOUS | Status: DC
  Administered 2021-10-12 – 2021-10-24 (×17): 6 via TOPICAL

## 2021-10-12 MED ORDER — DOCUSATE SODIUM 50 MG/5ML PO LIQD
100.0000 mg | Freq: Two times a day (BID) | ORAL | Status: DC
  Administered 2021-10-15 – 2021-10-24 (×10): 100 mg
  Filled 2021-10-12 (×11): qty 10

## 2021-10-12 MED ORDER — SODIUM CHLORIDE 0.9 % IV SOLN
250.0000 mL | INTRAVENOUS | Status: DC
  Administered 2021-10-16 – 2021-10-19 (×2): 250 mL via INTRAVENOUS

## 2021-10-12 MED ORDER — MIDAZOLAM HCL 2 MG/2ML IJ SOLN
1.0000 mg | INTRAMUSCULAR | Status: DC | PRN
  Administered 2021-10-13 – 2021-10-24 (×4): 1 mg via INTRAVENOUS
  Filled 2021-10-12 (×6): qty 2

## 2021-10-12 MED ORDER — MIDAZOLAM HCL 2 MG/2ML IJ SOLN
INTRAMUSCULAR | Status: AC
  Administered 2021-10-12: 2 mg
  Filled 2021-10-12: qty 2

## 2021-10-12 MED ORDER — ETOMIDATE 2 MG/ML IV SOLN
INTRAVENOUS | Status: AC
  Administered 2021-10-12: 20 mg
  Filled 2021-10-12: qty 20

## 2021-10-12 MED ORDER — ORAL CARE MOUTH RINSE
15.0000 mL | OROMUCOSAL | Status: DC
  Administered 2021-10-12 (×4): 15 mL via OROMUCOSAL

## 2021-10-12 MED ORDER — PROSOURCE TF PO LIQD
45.0000 mL | Freq: Two times a day (BID) | ORAL | Status: DC
  Administered 2021-10-12 – 2021-10-13 (×2): 45 mL
  Filled 2021-10-12 (×2): qty 45

## 2021-10-12 MED ORDER — FENTANYL CITRATE PF 50 MCG/ML IJ SOSY
25.0000 ug | PREFILLED_SYRINGE | INTRAMUSCULAR | Status: DC | PRN
  Administered 2021-10-12: 25 ug via INTRAVENOUS
  Filled 2021-10-12 (×2): qty 1

## 2021-10-12 MED ORDER — FENTANYL CITRATE PF 50 MCG/ML IJ SOSY
25.0000 ug | PREFILLED_SYRINGE | Freq: Once | INTRAMUSCULAR | Status: AC
  Administered 2021-10-12: 25 ug via INTRAVENOUS

## 2021-10-12 MED ORDER — LACTATED RINGERS IV BOLUS
250.0000 mL | Freq: Once | INTRAVENOUS | Status: AC
  Administered 2021-10-12: 250 mL via INTRAVENOUS

## 2021-10-12 MED ORDER — PANTOPRAZOLE 2 MG/ML SUSPENSION
40.0000 mg | Freq: Every day | ORAL | Status: DC
  Administered 2021-10-12 – 2021-10-24 (×13): 40 mg
  Filled 2021-10-12 (×14): qty 20

## 2021-10-12 MED ORDER — SODIUM CHLORIDE 0.9 % IV SOLN
500.0000 mg | INTRAVENOUS | Status: AC
  Administered 2021-10-12 – 2021-10-16 (×5): 500 mg via INTRAVENOUS
  Filled 2021-10-12 (×5): qty 5

## 2021-10-12 MED ORDER — NOREPINEPHRINE 4 MG/250ML-% IV SOLN
2.0000 ug/min | INTRAVENOUS | Status: DC
  Administered 2021-10-12: 2 ug/min via INTRAVENOUS
  Filled 2021-10-12: qty 250

## 2021-10-12 MED ORDER — ALBUTEROL SULFATE (2.5 MG/3ML) 0.083% IN NEBU: 2.5000 mg | INHALATION_SOLUTION | RESPIRATORY_TRACT | Status: DC | PRN

## 2021-10-12 MED ORDER — POLYETHYLENE GLYCOL 3350 17 G PO PACK
17.0000 g | PACK | Freq: Every day | ORAL | Status: DC
  Administered 2021-10-17 – 2021-10-20 (×4): 17 g
  Filled 2021-10-12 (×6): qty 1

## 2021-10-12 MED ORDER — DEXMEDETOMIDINE HCL IN NACL 400 MCG/100ML IV SOLN
0.0000 ug/kg/h | INTRAVENOUS | Status: DC
  Administered 2021-10-12: 0.7 ug/kg/h via INTRAVENOUS
  Administered 2021-10-13: 0.4 ug/kg/h via INTRAVENOUS
  Administered 2021-10-14: 0.2 ug/kg/h via INTRAVENOUS
  Filled 2021-10-12 (×3): qty 100

## 2021-10-12 MED ORDER — FENTANYL CITRATE PF 50 MCG/ML IJ SOSY
25.0000 ug | PREFILLED_SYRINGE | INTRAMUSCULAR | Status: DC | PRN
  Administered 2021-10-12: 25 ug via INTRAVENOUS
  Administered 2021-10-13 (×2): 50 ug via INTRAVENOUS
  Administered 2021-10-13: 100 ug via INTRAVENOUS
  Administered 2021-10-13: 50 ug via INTRAVENOUS

## 2021-10-12 MED ORDER — FENTANYL 2500MCG IN NS 250ML (10MCG/ML) PREMIX INFUSION
0.0000 ug/h | INTRAVENOUS | Status: DC
  Administered 2021-10-12: 25 ug/h via INTRAVENOUS
  Administered 2021-10-13: 125 ug/h via INTRAVENOUS
  Administered 2021-10-14: 250 ug/h via INTRAVENOUS
  Administered 2021-10-14 (×2): 225 ug/h via INTRAVENOUS
  Administered 2021-10-15: 125 ug/h via INTRAVENOUS
  Administered 2021-10-16: 150 ug/h via INTRAVENOUS
  Administered 2021-10-17 – 2021-10-19 (×2): 50 ug/h via INTRAVENOUS
  Filled 2021-10-12 (×9): qty 250

## 2021-10-12 NOTE — Plan of Care (Signed)
  Problem: Education: Goal: Knowledge of General Education information will improve Description Including pain rating scale, medication(s)/side effects and non-pharmacologic comfort measures Outcome: Progressing   

## 2021-10-12 NOTE — Progress Notes (Signed)
Notified Lab that ABG being sent for analysis. 

## 2021-10-12 NOTE — Progress Notes (Signed)
Patient transported to ICU on BiPAP without event. ?

## 2021-10-12 NOTE — Procedures (Signed)
Intubation Procedure Note ? ?Gina Petty  ?[id number]  ?1945/09/01 ? ?Date:10/12/21  ?Time:3:25 PM  ? ?Provider Performing: Lestine Mount  ? ?Procedure: Intubation (18841) ? ?Indication(s) ?Respiratory Failure ? ?Consent ?Risks of the procedure as well as the alternatives and risks of each were explained to the patient and/or caregiver.  Consent for the procedure was obtained and is signed in the bedside chart ? ?Anesthesia ?Etomidate, Versed, Fentanyl, and Rocuronium ? ?Time Out ?Verified patient identification, verified procedure, site/side was marked, verified correct patient position, special equipment/implants available, medications/allergies/relevant history reviewed, required imaging and test results available. ? ?Sterile Technique ?Usual hand hygiene, masks, and gloves were used ? ?Procedure Description ?Patient positioned in bed supine.  Sedation given as noted above.  Patient was intubated with endotracheal tube using Glidescope.  View was Grade 1 full glottis .  Number of attempts was 1.  Colorimetric CO2 detector was consistent with tracheal placement. ? ?Complications/Tolerance ?None; patient tolerated the procedure well. ?Chest X-ray is ordered to verify placement. ? ?EBL ?Minimal ? ?Specimen(s) ?None ? ?Lestine Mount, PA-C ?[facility] ?10/12/21 3:27 PM ? ?Please see [email or web address] for pager details. ? ?From 7A-7P if no response, please call [phone] ?After hours, please call ELink [phone] ?

## 2021-10-12 NOTE — Progress Notes (Signed)
An USGPIV (ultrasound guided PIV) has been placed for short-term vasopressor infusion. A correctly placed ivWatch must be used when administering Vasopressors. Should this treatment be needed beyond 72 hours, central line access should be obtained.  It will be the responsibility of the bedside nurse to follow best practice to prevent extravasations.   ?

## 2021-10-12 NOTE — Progress Notes (Addendum)
eLink Physician-Brief Progress Note ?Patient Name: Gina Petty ?DOB: 1946/04/14 ?MRN: [id number] ? ? ?Date of Service ? 10/12/2021  ?HPI/Events of Note ? Blood pressure is soft, patient has been on Precedex which is now paused.  ?eICU Interventions ? Peripheral Norepinephrine gtt ordered. LR 250 ml iv bolus x 1 followed by LR 75 ml / hour x 12 hours, BNP ordered to assess volume status.  ? ? ? ?  ? ?Kerry Kass Gurvir Schrom ?10/12/2021, 2:18 AM ?

## 2021-10-12 NOTE — Progress Notes (Signed)
eLink Physician-Brief Progress Note ?Patient Name: Gina Petty ?DOB: [birth year] ?MRN: [id number] ? ? ?Date of Service ? 10/12/2021  ?HPI/Events of Note ? Patient admitted with acute hypoxemic respiratory failure secondary to multi-focal pneumonia, she is currently on BIPAP.  ?eICU Interventions ? New Patient Evaluation.  ? ? ? ?  ? ?Tinesha Siegrist U Shamiracle Gorden ?10/12/2021, 1:16 AM ?

## 2021-10-12 NOTE — Progress Notes (Signed)
Bronchoscopy Procedure Note ? ?Gina Petty  ?[id number]  ?05-25-1946 ? ?Date:10/12/21  ?Time:3:31 PM  ? ?Provider Performing:Brent Yarisa Lynam  ? ?Procedure(s):  Flexible bronchoscopy with bronchial alveolar lavage (19758) ? ?Indication(s) ?Acute respiratory failure, community acquired pneumonia ? ?Consent ?Risks of the procedure as well as the alternatives and risks of each were explained to the patient and/or caregiver.  Consent for the procedure was obtained and is signed in the bedside chart ? ?Anesthesia ?Fentanyl, versed, etomidate, rocuronium ? ? ?Time Out ?Verified patient identification, verified procedure, site/side was marked, verified correct patient position, special equipment/implants available, medications/allergies/relevant history reviewed, required imaging and test results available. ? ? ?Sterile Technique ?Usual hand hygiene, masks, gowns, and gloves were used ? ? ?Procedure Description ?Bronchoscope advanced through endotracheal tube and into airway.  Airways were examined down to subsegmental level with findings noted below.   ?Following diagnostic evaluation, BAL(s) performed in RML with normal saline and return of cloudy fluid ? ?Findings: the trachea was normal in appearance, the carina was sharp, the bilateral tracheobronchial trees were norma in appearance and free of mass or other mucosal abnormality.  There were thick secretions which were not obstructing the airway in the left lower lobe and the right lower lobe. ? ? ?Complications/Tolerance ?None; patient tolerated the procedure well. ?Chest X-ray is not needed post procedure. ? ? ?EBL ?Minimal ? ? ?Specimen(s) ?BAL RML: bacterial, fungal culture and cell count with diff ? ?Roselie Awkward, MD ?[facility] ?Pager: [phone] ?Cell: [phone] ?After 7:00 pm call Elink  [phone] ? ? ? ?

## 2021-10-12 NOTE — Progress Notes (Addendum)
? ?NAME:  Gina Petty, MRN:  [id number], DOB:  Sep 12, 1945, LOS: 1 ?ADMISSION DATE:  10/11/2021, CONSULT DATE: 10/11/2021 ?REFERRING MD: EDP CHIEF COMPLAINT: Acute respiratory distress ? ?History of Present Illness:  ?76 year old woman who presented to [facility] ED 3/27 from home with SOB, dyspnea. PMHx significant for HTN, HLD, tobacco abuse (35 pack-years), depression. ? ?Patient states that she had had symptoms that started approximately 1 week PTA slowly progressing over the past 96 hours.  She was originally only mildly SOB with DOE but now is significantly dyspneic at rest.  During this period time she has had worsening cough (generally nonproductive, but does bring up sputum intermittently).  Denies chest pressure but does have reproducible bilateral chest pain that does not have a radiculopathy component.  Positive fevers x 72 hours.  Patient now states that she has nausea but no vomiting.  Positive diarrhea over the past 4 days.  On arrival to the emergency room patient required placement on BiPAP. CXR demonstrated bilateral infiltrates R > L c/w multilobar PNA. Empiric cefepime, azithromycin and vanc were initiated. Briefly required low-dose peripheral Levophed. ? ?Pertinent Medical History:  ?Hyperlipidemia  ?HTN ?Depression ?35 pyhx smoker ? ?Significant Hospital Events: ?Including procedures, antibiotic start and stop dates in addition to other pertinent events   ?3/27 - Admitted via ED from home for SOB, dyspnea. CXR with multifocal PNA (R > L). Low-dose NE to maintain MAP goals. Placed on BiPAP. ?3/28 -  Tolerating BiPAP (FiO2 45%), tachypneic to 30s. Off of NE. ? ?Objective:  ?Blood pressure 98/71, pulse 77, temperature 98.5 ?F (36.9 ?C), temperature source Axillary, resp. rate (!) 32, height _0  (1.676 m), weight 81.7 kg, SpO2 93 %. ?   ?FiO2 (%):  [60 %] 60 %  ? ?Intake/Output Summary (Last 24 hours) at 10/12/2021 0951 ?Last data filed at 10/12/2021 6269 ?Gross per 24 hour  ?Intake 1135.77 ml  ?Output --   ?Net 1135.77 ml  ? ?Filed Weights  ? 10/11/21 1906 10/12/21 0232  ?Weight: 88.6 kg 81.7 kg  ? ?Physical Examination: ?General: Acutely ill-appearing elderly woman in NAD. ?HEENT: [place]/AT, anicteric sclera, PERRL, moist mucous membranes. BiPAP mask in place. ?Neuro: Lethargic. Nodding appropriately to questions. Responds to verbal stimuli. Following commands consistently. Moves all 4 extremities spontaneously. ?CV: RRR, no m/g/r. ?PULM: Breathing even and mildly labored on BiPAP (FiO2 45%), tachypneic to mid-high 30s. Lung fields with bilateral rhonchi, R > L. ?GI: Soft, nontender, mildly distended. Normoactive bowel sounds. ?Extremities: Trace symmetric LE edema noted. ?Skin: Warm/dry, no rashes. ? ?Assessment & Plan:  ?Acute hypoxic respiratory failure ?- Continue BiPAP support ?- Wean FiO2 for O2 sat > 90% ?- Likely will require intubation today, 3/28 given persistent tachypnea/fatigue; will hold off for now and revisit in the afternoon ?- BAL post-intubation ?- Continue steroids ?- Pulmonary hygiene ?- PAD protocol for sedation: Precedex for goal RASS 0 to -1 ? ?Community associated pneumonia ?CXR demonstrating bilateral infiltrates c/w multilobar PNA. ?- Continue empiric cefepime, azithromycin ?- Discontinue vanc, as MRSA nasal swab negative ?- O2 management as above ?- F/u sputum Cx/BAL once obtained ?- Follow CXR ? ?Significant prior smoking history ?History of tobacco abuse. 35-pack-year history. ?- Encourage cessation when able to participate in care ? ?Acute kidney injury ?- Trend BMP ?- Replete electrolytes as indicated ?- Monitor I&Os ?- F/u urine studies ?- Avoid nephrotoxic agents as able ?- Ensure adequate renal perfusion ? ?Best Practice (right click and "Reselect all SmartList Selections" daily)  ? ?Diet/type: NPO ?DVT  prophylaxis: LMWH ?GI prophylaxis: PPI ?Lines: N/A ?Foley:  N/A ?Code Status:  full code ?Last date of multidisciplinary goals of care discussion [Pending] ? ?Critical care time: 40  minutes  ? ?Lestine Mount, PA-C ?[facility] ?10/12/21 9:52 AM ? ?Please see [email or web address] for pager details. ? ?From 7A-7P if no response, please call [phone] ?After hours, please call ELink [phone] ?

## 2021-10-13 DIAGNOSIS — L8990 Pressure ulcer of unspecified site, unspecified stage: Secondary | ICD-10-CM | POA: Insufficient documentation

## 2021-10-13 DIAGNOSIS — J189 Pneumonia, unspecified organism: Secondary | ICD-10-CM | POA: Diagnosis not present

## 2021-10-13 LAB — BLOOD GAS, ARTERIAL
Acid-Base Excess: 0.7 mmol/L (ref 0.0–2.0)
Bicarbonate: 24.3 mmol/L (ref 20.0–28.0)
Drawn by: 56037
FIO2: 40 %
MECHVT: 470 mL
O2 Saturation: 97.6 %
PEEP: 8 cmH2O
Patient temperature: 36.8
RATE: 18 resp/min
pCO2 arterial: 35 mmHg (ref 32–48)
pH, Arterial: 7.45 (ref 7.35–7.45)
pO2, Arterial: 72 mmHg — ABNORMAL LOW (ref 83–108)

## 2021-10-13 LAB — MAGNESIUM
Magnesium: 2.9 mg/dL — ABNORMAL HIGH (ref 1.7–2.4)
Magnesium: 2.7 mg/dL — ABNORMAL HIGH (ref 1.7–2.4)

## 2021-10-13 LAB — URINALYSIS, ROUTINE W REFLEX MICROSCOPIC
Bacteria, UA: NONE SEEN
Bilirubin Urine: NEGATIVE
Glucose, UA: NEGATIVE mg/dL
Hgb urine dipstick: NEGATIVE
Ketones, ur: NEGATIVE mg/dL
Leukocytes,Ua: NEGATIVE
Nitrite: NEGATIVE
Protein, ur: 30 mg/dL — AB
Specific Gravity, Urine: 1.028 (ref 1.005–1.030)
pH: 6 (ref 5.0–8.0)

## 2021-10-13 LAB — URINE CULTURE

## 2021-10-13 LAB — CBC
HCT: 38.4 % (ref 36.0–46.0)
Hemoglobin: 12.4 g/dL (ref 12.0–15.0)
MCH: 33.2 pg (ref 26.0–34.0)
MCHC: 32.3 g/dL (ref 30.0–36.0)
MCV: 102.7 fL — ABNORMAL HIGH (ref 80.0–100.0)
Platelets: 204 10*3/uL (ref 150–400)
RBC: 3.74 MIL/uL — ABNORMAL LOW (ref 3.87–5.11)
RDW: 14 % (ref 11.5–15.5)
WBC: 10.9 10*3/uL — ABNORMAL HIGH (ref 4.0–10.5)
nRBC: 0.2 % (ref 0.0–0.2)

## 2021-10-13 LAB — GLUCOSE, CAPILLARY
Glucose-Capillary: 180 mg/dL — ABNORMAL HIGH (ref 70–99)
Glucose-Capillary: 178 mg/dL — ABNORMAL HIGH (ref 70–99)
Glucose-Capillary: 143 mg/dL — ABNORMAL HIGH (ref 70–99)
Glucose-Capillary: 147 mg/dL — ABNORMAL HIGH (ref 70–99)
Glucose-Capillary: 192 mg/dL — ABNORMAL HIGH (ref 70–99)
Glucose-Capillary: 144 mg/dL — ABNORMAL HIGH (ref 70–99)

## 2021-10-13 LAB — BASIC METABOLIC PANEL
Anion gap: 8 (ref 5–15)
BUN: 70 mg/dL — ABNORMAL HIGH (ref 8–23)
CO2: 23 mmol/L (ref 22–32)
Calcium: 8.8 mg/dL — ABNORMAL LOW (ref 8.9–10.3)
Chloride: 111 mmol/L (ref 98–111)
Creatinine, Ser: 1.05 mg/dL — ABNORMAL HIGH (ref 0.44–1.00)
GFR, Estimated: 56 mL/min — ABNORMAL LOW (ref >60)
Glucose, Bld: 178 mg/dL — ABNORMAL HIGH (ref 70–99)
Potassium: 3.8 mmol/L (ref 3.5–5.1)
Sodium: 142 mmol/L (ref 135–145)

## 2021-10-13 LAB — PHOSPHORUS
Phosphorus: 4.3 mg/dL (ref 2.5–4.6)
Phosphorus: 5.2 mg/dL — ABNORMAL HIGH (ref 2.5–4.6)

## 2021-10-13 MED ORDER — OXYCODONE HCL 5 MG/5ML PO SOLN
5.0000 mg | Freq: Four times a day (QID) | ORAL | Status: DC
  Administered 2021-10-13 – 2021-10-25 (×47): 5 mg
  Filled 2021-10-13 (×46): qty 5

## 2021-10-13 MED ORDER — OSMOLITE 1.2 CAL PO LIQD
1000.0000 mL | ORAL | Status: DC
Start: 2021-10-13 — End: 2021-10-22
  Administered 2021-10-13 – 2021-10-22 (×11): 1000 mL

## 2021-10-13 MED ORDER — PROSOURCE TF PO LIQD
45.0000 mL | Freq: Three times a day (TID) | ORAL | Status: DC
  Administered 2021-10-13 – 2021-10-22 (×26): 45 mL
  Filled 2021-10-13 (×28): qty 45

## 2021-10-13 MED ORDER — CLONAZEPAM 0.1 MG/ML ORAL SUSPENSION: 1.0000 mg | Freq: Two times a day (BID) | ORAL | Status: DC

## 2021-10-13 MED ORDER — LIP MEDEX EX OINT
TOPICAL_OINTMENT | CUTANEOUS | Status: DC | PRN
  Filled 2021-10-13: qty 7

## 2021-10-13 MED ORDER — CHLORHEXIDINE GLUCONATE 0.12% ORAL RINSE (MEDLINE KIT)
15.0000 mL | Freq: Two times a day (BID) | OROMUCOSAL | Status: DC
  Administered 2021-10-13 – 2021-10-25 (×25): 15 mL via OROMUCOSAL

## 2021-10-13 MED ORDER — ORAL CARE MOUTH RINSE
15.0000 mL | OROMUCOSAL | Status: DC
  Administered 2021-10-13 – 2021-10-25 (×122): 15 mL via OROMUCOSAL

## 2021-10-13 MED ORDER — ORAL CARE MOUTH RINSE
15.0000 mL | OROMUCOSAL | Status: DC
  Administered 2021-10-13 (×3): 15 mL via OROMUCOSAL

## 2021-10-13 MED ORDER — INSULIN ASPART 100 UNIT/ML IJ SOLN
2.0000 [IU] | INTRAMUSCULAR | Status: DC
  Administered 2021-10-13 (×2): 4 [IU] via SUBCUTANEOUS
  Administered 2021-10-13: 2 [IU] via SUBCUTANEOUS
  Administered 2021-10-13: 4 [IU] via SUBCUTANEOUS
  Administered 2021-10-13 (×2): 2 [IU] via SUBCUTANEOUS
  Administered 2021-10-14 – 2021-10-15 (×10): 4 [IU] via SUBCUTANEOUS
  Administered 2021-10-15: 2 [IU] via SUBCUTANEOUS
  Administered 2021-10-15: 4 [IU] via SUBCUTANEOUS
  Administered 2021-10-16 (×4): 2 [IU] via SUBCUTANEOUS
  Administered 2021-10-16: 4 [IU] via SUBCUTANEOUS
  Administered 2021-10-17 (×2): 2 [IU] via SUBCUTANEOUS
  Administered 2021-10-17: 4 [IU] via SUBCUTANEOUS
  Administered 2021-10-17: 2 [IU] via SUBCUTANEOUS
  Administered 2021-10-18: 4 [IU] via SUBCUTANEOUS
  Administered 2021-10-18: 2 [IU] via SUBCUTANEOUS
  Administered 2021-10-19: 4 [IU] via SUBCUTANEOUS
  Administered 2021-10-19 (×3): 2 [IU] via SUBCUTANEOUS
  Administered 2021-10-19: 4 [IU] via SUBCUTANEOUS
  Administered 2021-10-20 (×2): 2 [IU] via SUBCUTANEOUS
  Administered 2021-10-21: 4 [IU] via SUBCUTANEOUS
  Administered 2021-10-22 (×2): 2 [IU] via SUBCUTANEOUS
  Administered 2021-10-22: 4 [IU] via SUBCUTANEOUS
  Administered 2021-10-23: 2 [IU] via SUBCUTANEOUS
  Administered 2021-10-23 (×2): 4 [IU] via SUBCUTANEOUS
  Administered 2021-10-23: 2 [IU] via SUBCUTANEOUS
  Administered 2021-10-24: 4 [IU] via SUBCUTANEOUS
  Administered 2021-10-24: 2 [IU] via SUBCUTANEOUS
  Administered 2021-10-24: 4 [IU] via SUBCUTANEOUS
  Administered 2021-10-24 – 2021-10-25 (×5): 2 [IU] via SUBCUTANEOUS

## 2021-10-13 MED ORDER — CHLORHEXIDINE GLUCONATE 0.12% ORAL RINSE (MEDLINE KIT): 15.0000 mL | Freq: Two times a day (BID) | OROMUCOSAL | Status: DC

## 2021-10-13 MED ORDER — FREE WATER
100.0000 mL | Freq: Four times a day (QID) | Status: DC
Start: 2021-10-13 — End: 2021-10-16
  Administered 2021-10-13 – 2021-10-16 (×12): 100 mL

## 2021-10-13 MED ORDER — CLONAZEPAM 1 MG PO TABS
1.0000 mg | ORAL_TABLET | Freq: Two times a day (BID) | ORAL | Status: DC
  Administered 2021-10-13 – 2021-10-23 (×21): 1 mg
  Filled 2021-10-13 (×24): qty 1

## 2021-10-13 NOTE — Progress Notes (Signed)
Initial Nutrition Assessment ? ?DOCUMENTATION CODES:  ? ?Not applicable ? ?INTERVENTION:  ?- will adjust TF regimen: Osmolite 1.2 @ 25 ml/hr to advance by 10 ml every 8 hours to reach goal rate of 55 ml/hr with 45 ml Prosource TF TID and 100 ml free water QID. ? ?- at goal rate, this regimen will provide 1704 kcal (97% kcal need), 106 grams protein, and 1470 ml free water. ? ? ?NUTRITION DIAGNOSIS:  ? ?Inadequate oral intake related to inability to eat as evidenced by NPO status. ? ?GOAL:  ? ?Patient will meet greater than or equal to 90% of their needs ? ?MONITOR:  ? ?Vent status, TF tolerance, Labs, Weight trends, I & O's ? ?REASON FOR ASSESSMENT:  ? ?Ventilator, Consult ?Enteral/tube feeding initiation and management ? ?ASSESSMENT:  ? ?76 year old female with medical history of HTN, PE, depression, and arthritis. She presented to the ED from home with 1 week hx of progressive shortness of breath with dyspnea on exertion, and worsening cough. She also had 72 hours of fever and diarrhea and reported feeling nauseated in the ED but no emesis PTA or since admission. In the ED she required BiPAP and was admitted for acute respiratory failure and transferred to the ICU where she was intubated on 3/28 afternoon. ? ?Patient discussed with RN. Her daughter was at bedside and shares that patient lives with her brother (patient's son) and that she has been eating very poorly for the 1 week PTA. Unable to to obtain any further information at this time. ? ?Patient very agitated throughout RD visit and required re-direction and pushes of sedation. ? ?Patient remains intubated and OGT in place (in the region of pylorus, per abdominal x-ray). She is receiving TF per protocol: Vital High Protein @ 40 ml/hr with 45 ml Prosource TF BID.  ? ?Weight today is 189 lb and PTA the most recently documented weight was 195 lb on 10/02/19. This indicates 6 lb weight loss (3% body weight) in 2 years; not significant for time frame.   ? ? ?Patient is currently intubated on ventilator support ?MV: 10.1 L/min ?Temp (24hrs), Avg:98.5 ?F (36.9 ?C), Min:98 ?F (36.7 ?C), Max:100.2 ?F (37.9 ?C) ?Propofol: none  ?BP: 116/47 and MAP: 70 ? ? ?Labs reviewed; CBGs: 180, 178, 143 mg/dl, BUN: 70 mg/dl, creatinine: 1.05 mg/dl, Mg: 2.9 mg/dl, GFR: 56 ml/min. ? ?Medications reviewed; 100 mg colace BID, sliding scale novolog, 40 mg protonix/day, 17 g miralax/day. ? ?Drips; fentanyl @ 75 mcg/hr, precedex @ 0.3 mcg/kg/hr.  ?  ? ?NUTRITION - FOCUSED PHYSICAL EXAM: ? ?Flowsheet Row Most Recent Value  ?Orbital Region No depletion  ?Upper Arm Region No depletion  ?Thoracic and Lumbar Region No depletion  ?Buccal Region No depletion  ?Temple Region No depletion  ?Clavicle Bone Region No depletion  ?Clavicle and Acromion Bone Region No depletion  ?Scapular Bone Region No depletion  ?Dorsal Hand Unable to assess  [mittens]  ?Patellar Region No depletion  ?Anterior Thigh Region No depletion  ?Posterior Calf Region No depletion  ?Edema (RD Assessment) Mild  [BLE]  ?Hair Reviewed  ?Eyes Unable to assess  ?Mouth Unable to assess  ?Skin Reviewed  ?Nails Unable to assess  ? ?  ? ? ?Diet Order:   ?Diet Order   ? ?       ?  Diet NPO time specified  Diet effective now       ?  ? ?  ?  ? ?  ? ? ?EDUCATION NEEDS:  ? ?  No education needs have been identified at this time ? ?Skin:  Skin Assessment: Skin Integrity Issues: ?Skin Integrity Issues:: Stage I ?Stage I: bilateral buttocks ? ?Last BM:  3/29 (type 7 x1, medium amount) ? ?Height:  ? ?Ht Readings from Last 1 Encounters:  ?10/13/21 '5\' 6"'$  (1.676 m)  ? ? ?Weight:  ? ?Wt Readings from Last 1 Encounters:  ?10/13/21 85.6 kg  ? ? ?BMI:  Body mass index is 30.46 kg/m?. ? ?Estimated Nutritional Needs:  ?Kcal:  1758 kcal ?Protein:  103-128 grams ?Fluid:  >/= 2 L/day ? ? ? ? ?Jarome Matin, MS, RD, LDN ?Registered Dietitian II ?Inpatient Clinical Nutrition ?RD pager # and on-call/weekend pager # available in [place]  ? ?
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
## 2021-10-13 NOTE — Progress Notes (Signed)
? ?NAME:  Gina Petty, MRN:  [id number], DOB:  May 25, 1946, LOS: 2 ?ADMISSION DATE:  10/11/2021, CONSULTATION DATE:  3/27 ?REFERRING MD:  Kommor , CHIEF COMPLAINT:  Dyspnea  ? ?History of Present Illness:  ?76 y/o female with an extensive smoking history admitted on 3/27 severe acute respiratory failure with hypoxemia due to severe community acquired pneumonia. ? ?Pertinent  Medical History  ?Hyperlipidemia ?Hypertension ?Depression ?Cigarette smoker> 35 pack year ? ?Significant Hospital Events: ?Including procedures, antibiotic start and stop dates in addition to other pertinent events   ?3/27 - Admitted via ED from home for SOB, dyspnea. CXR with multifocal PNA (R > L). Low-dose NE to maintain MAP goals. Placed on BiPAP. ?3/28 -  tachypnea (FiO2 45%), tachypneic to 30s. Off of NE.> intubated ? ?Micro ?3/27 sars cov 2/flu > neg ?3/27 resp viral panel >  ?3/27 blood >  ?3/27 urine >  ?3/28 BAL >  ? ?Antibiotics ?3/27 cefepime >  ?3/27 azithro >  ? ?Interim History / Subjective:  ? ?Intubated yesterday ?Remains on mechanical ventilator support ?Failed SBT this morning due to tachypnea ? ? ?Objective   ?Blood pressure 119/60, pulse (!) 58, temperature 98 ?F (36.7 ?C), temperature source Oral, resp. rate (!) 26, height _0  (1.676 m), weight 85.6 kg, SpO2 95 %. ?   ?Vent Mode: PRVC ?FiO2 (%):  [40 %-60 %] 40 % ?Set Rate:  [18 bmp] 18 bmp ?Vt Set:  [470 mL] 470 mL ?PEEP:  [5 cmH20-8 cmH20] 8 cmH20 ?Plateau Pressure:  [19 cmH20-20 cmH20] 19 cmH20  ? ?Intake/Output Summary (Last 24 hours) at 10/13/2021 0750 ?Last data filed at 10/13/2021 1829 ?Gross per 24 hour  ?Intake 1177.49 ml  ?Output 400 ml  ?Net 777.49 ml  ? ?Filed Weights  ? 10/11/21 1906 10/12/21 0232 10/13/21 0500  ?Weight: 88.6 kg 81.7 kg 85.6 kg  ? ? ?Examination: ? ?General:  In bed on vent ?HENT: NCAT ETT in place ?PULM: Crackles bases B, vent supported breathing ?CV: RRR, no mgr ?GI: BS+, soft, nontender ?MSK: normal bulk and tone ?Neuro: sedated on  vent ? ? ?Resolved Hospital Problem list   ? ?Assessment & Plan:  ?Acute hypoxemic respiratory failure due to CAP (POA) ?Full mechanical vent support ?VAP prevention ?Daily WUA/SBT ?Continue cefepime and azithro, narrow if BAL culture comes back positive ? ?Cigarette smoker ?Counsel to quit ? ?AKI ?Monitor BMET and UOP ?Replace electrolytes as needed ?IV fluids to continue ? ?Need for sedation for mechanical ventilation ?Severe ventilator dyssynchrony ?PAD Protocol RASS target 0 to -1: fentanyl, precedex ?Add oxycodone and clonazepam ? ?Hypertension ?Monitor hemodynamics ? ?Depression ?Wellbutrin ?symbalta ? ?Best Practice (right click and "Reselect all SmartList Selections" daily)  ? ?Diet/type: tubefeeds ?DVT prophylaxis: LMWH ?GI prophylaxis: PPI ?Lines: N/A ?Foley:  N/A ?Code Status:  full code ?Last date of multidisciplinary goals of care discussion [3/28] ? ?Labs   ?CBC: ?Recent Labs  ?Lab 10/11/21 ?1900 10/12/21 ?0239 10/13/21 ?0434  ?WBC 28.4* 22.3* 10.9*  ?NEUTROABS 24.9*  --   --   ?HGB 14.1 11.6* 12.4  ?HCT 42.8 36.4 38.4  ?MCV 100.7* 104.6* 102.7*  ?PLT 307 243 204  ? ? ?Basic Metabolic Panel: ?Recent Labs  ?Lab 10/11/21 ?1900 10/12/21 ?0239 10/12/21 ?9371 10/12/21 ?1530 10/13/21 ?0434  ?NA 141 139  --   --  142  ?K 2.9* 3.6  --   --  3.8  ?CL 102 106  --   --  111  ?CO2 26 24  --   --  23  ?GLUCOSE 160* 147*  --   --  178*  ?BUN 44* 48*  --   --  70*  ?CREATININE 1.29* 1.20* 1.24*  --  1.05*  ?CALCIUM 9.3 8.5*  --   --  8.8*  ?MG  --  2.6*  --  2.8* 2.9*  ?PHOS  --  4.5  --  4.5 4.3  ? ?GFR: ?Estimated Creatinine Clearance: 52.6 mL/min (A) (by C-G formula based on SCr of 1.05 mg/dL (H)). ?Recent Labs  ?Lab 10/11/21 ?1900 10/11/21 ?2129 10/12/21 ?0239 10/12/21 ?0932 10/13/21 ?0434  ?PROCALCITON  --   --   --  4.86  --   ?WBC 28.4*  --  22.3*  --  10.9*  ?LATICACIDVEN 2.9* 2.1*  --   --   --   ? ? ?Liver Function Tests: ?Recent Labs  ?Lab 10/11/21 ?1900  ?AST 92*  ?ALT 83*  ?ALKPHOS 142*  ?BILITOT 1.2   ?PROT 7.1  ?ALBUMIN 2.3*  ? ?No results for input(s): LIPASE, AMYLASE in the last 168 hours. ?No results for input(s): AMMONIA in the last 168 hours. ? ?ABG ?   ?Component Value Date/Time  ? PHART 7.45 10/13/2021 0334  ? PCO2ART 35 10/13/2021 0334  ? PO2ART 72 (L) 10/13/2021 0334  ? HCO3 24.3 10/13/2021 0334  ? O2SAT 97.6 10/13/2021 0334  ?  ? ?Coagulation Profile: ?Recent Labs  ?Lab 10/11/21 ?1900  ?INR 1.3*  ? ? ?Cardiac Enzymes: ?No results for input(s): CKTOTAL, CKMB, CKMBINDEX, TROPONINI in the last 168 hours. ? ?HbA1C: ?No results found for: HGBA1C ? ?CBG: ?Recent Labs  ?Lab 10/12/21 ?1649 10/12/21 ?2035 10/12/21 ?2353 10/13/21 ?0109 10/13/21 ?0403  ?GLUCAP 154* 191* 190* 180* 178*  ? ?Critical care time: 31 minutes ?  ? ? ?Roselie Awkward, MD ?[facility] ?Pager: [phone] ?Cell: [phone] ?After 7:00 pm call [facility]  [phone] ? ? ? ?

## 2021-10-13 NOTE — Progress Notes (Signed)
eLink Physician-Brief Progress Note ?Patient Name: Gina Petty ?DOB: [birth year] ?MRN: [id number] ? ? ?Date of Service ? 10/13/2021  ?HPI/Events of Note ? Patient with hyperglycemia, and foul smelling urine.  ?eICU Interventions ? Hyperglycemia SQ Insulin protocol ordered, urinalysis ordered.  ? ? ? ?Intervention Category ?Intermediate Interventions: Hyperglycemia - evaluation and treatment ? ?Kerry Kass Vertie Dibbern ?10/13/2021, 12:31 AM ?

## 2021-10-13 NOTE — Progress Notes (Signed)
eLink Physician-Brief Progress Note ?Patient Name: Gina Petty ?DOB: May 18, 1946 ?MRN: [id number] ? ? ?Date of Service ? 10/13/2021  ?HPI/Events of Note ? ABG result reviewed.  ?eICU Interventions ? No intervention.  ? ? ? ?  ? ?Kerry Kass Lela Gell ?10/13/2021, 5:11 AM ?

## 2021-10-14 ENCOUNTER — Inpatient Hospital Stay (HOSPITAL_COMMUNITY): Payer: Medicare Other

## 2021-10-14 DIAGNOSIS — J189 Pneumonia, unspecified organism: Secondary | ICD-10-CM | POA: Diagnosis not present

## 2021-10-14 LAB — GLUCOSE, CAPILLARY
Glucose-Capillary: 152 mg/dL — ABNORMAL HIGH (ref 70–99)
Glucose-Capillary: 153 mg/dL — ABNORMAL HIGH (ref 70–99)
Glucose-Capillary: 159 mg/dL — ABNORMAL HIGH (ref 70–99)
Glucose-Capillary: 164 mg/dL — ABNORMAL HIGH (ref 70–99)
Glucose-Capillary: 165 mg/dL — ABNORMAL HIGH (ref 70–99)
Glucose-Capillary: 182 mg/dL — ABNORMAL HIGH (ref 70–99)
Glucose-Capillary: 184 mg/dL — ABNORMAL HIGH (ref 70–99)

## 2021-10-14 LAB — CBC
HCT: 38.3 % (ref 36.0–46.0)
Hemoglobin: 11.8 g/dL — ABNORMAL LOW (ref 12.0–15.0)
MCH: 32.8 pg (ref 26.0–34.0)
MCHC: 30.8 g/dL (ref 30.0–36.0)
MCV: 106.4 fL — ABNORMAL HIGH (ref 80.0–100.0)
Platelets: 222 10*3/uL (ref 150–400)
RBC: 3.6 MIL/uL — ABNORMAL LOW (ref 3.87–5.11)
RDW: 14.2 % (ref 11.5–15.5)
WBC: 12.4 10*3/uL — ABNORMAL HIGH (ref 4.0–10.5)
nRBC: 0 % (ref 0.0–0.2)

## 2021-10-14 LAB — BASIC METABOLIC PANEL
Anion gap: 8 (ref 5–15)
BUN: 69 mg/dL — ABNORMAL HIGH (ref 8–23)
CO2: 24 mmol/L (ref 22–32)
Calcium: 8.5 mg/dL — ABNORMAL LOW (ref 8.9–10.3)
Chloride: 111 mmol/L (ref 98–111)
Creatinine, Ser: 1.02 mg/dL — ABNORMAL HIGH (ref 0.44–1.00)
GFR, Estimated: 58 mL/min — ABNORMAL LOW (ref >60)
Glucose, Bld: 146 mg/dL — ABNORMAL HIGH (ref 70–99)
Potassium: 4.6 mmol/L (ref 3.5–5.1)
Sodium: 143 mmol/L (ref 135–145)

## 2021-10-14 LAB — MAGNESIUM: Magnesium: 2.7 mg/dL — ABNORMAL HIGH (ref 1.7–2.4)

## 2021-10-14 LAB — PHOSPHORUS: Phosphorus: 5.4 mg/dL — ABNORMAL HIGH (ref 2.5–4.6)

## 2021-10-14 MED ORDER — CHLORHEXIDINE GLUCONATE 0.12 % MT SOLN
OROMUCOSAL | Status: AC
  Administered 2021-10-14: 15 mL
  Filled 2021-10-14: qty 15

## 2021-10-14 MED ORDER — PROPOFOL 1000 MG/100ML IV EMUL
5.0000 ug/kg/min | INTRAVENOUS | Status: DC
  Administered 2021-10-14: 20 ug/kg/min via INTRAVENOUS
  Administered 2021-10-14: 5 ug/kg/min via INTRAVENOUS
  Administered 2021-10-15: 10 ug/kg/min via INTRAVENOUS
  Administered 2021-10-15: 15 ug/kg/min via INTRAVENOUS
  Administered 2021-10-16 (×2): 25 ug/kg/min via INTRAVENOUS
  Administered 2021-10-16: 20 ug/kg/min via INTRAVENOUS
  Administered 2021-10-17 – 2021-10-18 (×6): 30 ug/kg/min via INTRAVENOUS
  Administered 2021-10-18: 40 ug/kg/min via INTRAVENOUS
  Administered 2021-10-18 – 2021-10-19 (×2): 30 ug/kg/min via INTRAVENOUS
  Administered 2021-10-19: 20 ug/kg/min via INTRAVENOUS
  Administered 2021-10-19: 15 ug/kg/min via INTRAVENOUS
  Administered 2021-10-19: 30 ug/kg/min via INTRAVENOUS
  Administered 2021-10-20: 5 ug/kg/min via INTRAVENOUS
  Administered 2021-10-20: 25 ug/kg/min via INTRAVENOUS
  Administered 2021-10-20 – 2021-10-21 (×2): 15 ug/kg/min via INTRAVENOUS
  Administered 2021-10-21: 25 ug/kg/min via INTRAVENOUS
  Administered 2021-10-21: 30 ug/kg/min via INTRAVENOUS
  Filled 2021-10-14 (×25): qty 100

## 2021-10-14 MED ORDER — MIDAZOLAM HCL 2 MG/2ML IJ SOLN
2.0000 mg | INTRAMUSCULAR | Status: DC | PRN
  Administered 2021-10-14 – 2021-10-23 (×10): 2 mg via INTRAVENOUS
  Filled 2021-10-14 (×10): qty 2

## 2021-10-14 MED ORDER — MIDAZOLAM HCL 2 MG/2ML IJ SOLN
2.0000 mg | Freq: Once | INTRAMUSCULAR | Status: AC
  Administered 2021-10-14: 2 mg via INTRAVENOUS
  Filled 2021-10-14: qty 2

## 2021-10-14 NOTE — Progress Notes (Signed)
? ?NAME:  Gina Petty, MRN:  [id number], DOB:  08-03-1945, LOS: 3 ?ADMISSION DATE:  10/11/2021, CONSULTATION DATE:  3/27 ?REFERRING MD:  Kommor , CHIEF COMPLAINT:  Dyspnea  ? ?History of Present Illness:  ?76 y/o female with an extensive smoking history admitted on 3/27 severe acute respiratory failure with hypoxemia due to severe community acquired pneumonia. ? ?Pertinent  Medical History  ?Hyperlipidemia ?Hypertension ?Depression ?Cigarette smoker> 35 pack year ? ?Significant Hospital Events: ?Including procedures, antibiotic start and stop dates in addition to other pertinent events   ?3/27 - Admitted via ED from home for SOB, dyspnea. CXR with multifocal PNA (R > L). Low-dose NE to maintain MAP goals. Placed on BiPAP. ?3/28 -  tachypnea (FiO2 45%), tachypneic to 30s. Off of NE.> intubated ? ?Micro ?3/27 sars cov 2/flu > neg ?3/27 resp viral panel >  ?3/27 blood >  ?3/27 urine >  ?3/28 BAL >  ? ?Antibiotics ?3/27 cefepime >  ?3/27 azithro >  ? ?Interim History / Subjective:  ? ?Periods of severe agitation overnight ?Oxygenation slightly worse today ? ? ?Objective   ?Blood pressure (!) 122/59, pulse 60, temperature 97.9 ?F (36.6 ?C), temperature source Axillary, resp. rate 18, height _0  (1.676 m), weight 84.9 kg, SpO2 94 %. ?   ?Vent Mode: PRVC ?FiO2 (%):  [40 %-60 %] 60 % ?Set Rate:  [18 bmp] 18 bmp ?Vt Set:  [470 mL] 470 mL ?PEEP:  [8 cmH20] 8 cmH20 ?Pressure Support:  [5 cmH20] 5 cmH20 ?Plateau Pressure:  [17 cmH20-25 cmH20] 22 cmH20  ? ?Intake/Output Summary (Last 24 hours) at 10/14/2021 0907 ?Last data filed at 10/14/2021 0806 ?Gross per 24 hour  ?Intake 2275.26 ml  ?Output 1175 ml  ?Net 1100.26 ml  ? ?Filed Weights  ? 10/12/21 0232 10/13/21 0500 10/14/21 0404  ?Weight: 81.7 kg 85.6 kg 84.9 kg  ? ? ?Examination: ? ?General:  In bed on vent ?HENT: NCAT ETT in place ?PULM: Crackles bases B, vent supported breathing ?CV: RRR, no mgr ?GI: BS+, soft, nontender ?MSK: normal bulk and tone ?Neuro: sedated on  vent ? ? ? ?Resolved Hospital Problem list   ? ?Assessment & Plan:  ?Acute hypoxemic respiratory failure due to CAP (POA) ?Full mechanical vent support ?VAP prevention ?Daily WUA/SBT ?Continue cefepime and azithro, monitor BAL culture ? ?Cigarette smoker ?Counsel to quit ? ?AKI ?Hold IV fluids ?Monitor BMET and UOP ?Replace electrolytes as needed ? ?Need for sedation for mechanical ventilation ?Severe ventilator dyssynchrony, periods of severe agitation ?PAD protocol to continue: RASS target 0 to -1, fentanyl ?Stop precedex, start propofol ?Continue low dose oxycodone and clonezepam ? ?Hypertension ?Monitor hemodynamics ? ?Depression ?Wellbutrin ?Symbalta ? ? ?Best Practice (right click and "Reselect all SmartList Selections" daily)  ? ?Diet/type: tubefeeds ?DVT prophylaxis: LMWH ?GI prophylaxis: PPI ?Lines: N/A ?Foley:  N/A ?Code Status:  full code ?Last date of multidisciplinary goals of care discussion [3/28; on 3/30 I called her daughter [name] for an update] ? ?Labs   ?CBC: ?Recent Labs  ?Lab 10/11/21 ?1900 10/12/21 ?0239 10/13/21 ?0434 10/14/21 ?5956  ?WBC 28.4* 22.3* 10.9* 12.4*  ?NEUTROABS 24.9*  --   --   --   ?HGB 14.1 11.6* 12.4 11.8*  ?HCT 42.8 36.4 38.4 38.3  ?MCV 100.7* 104.6* 102.7* 106.4*  ?PLT 307 243 204 222  ? ? ?Basic Metabolic Panel: ?Recent Labs  ?Lab 10/11/21 ?1900 10/12/21 ?0239 10/12/21 ?3875 10/12/21 ?1530 10/13/21 ?0434 10/13/21 ?1638 10/14/21 ?0256 10/14/21 ?6433  ?NA 141 139  --   --  142  --   --  143  ?K 2.9* 3.6  --   --  3.8  --   --  4.6  ?CL 102 106  --   --  111  --   --  111  ?CO2 26 24  --   --  23  --   --  24  ?GLUCOSE 160* 147*  --   --  178*  --   --  146*  ?BUN 44* 48*  --   --  70*  --   --  69*  ?CREATININE 1.29* 1.20* 1.24*  --  1.05*  --   --  1.02*  ?CALCIUM 9.3 8.5*  --   --  8.8*  --   --  8.5*  ?MG  --  2.6*  --  2.8* 2.9* 2.7* 2.7*  --   ?PHOS  --  4.5  --  4.5 4.3 5.2* 5.4*  --   ? ?GFR: ?Estimated Creatinine Clearance: 53.9 mL/min (A) (by C-G formula based on SCr  of 1.02 mg/dL (H)). ?Recent Labs  ?Lab 10/11/21 ?1900 10/11/21 ?2129 10/12/21 ?0239 10/12/21 ?4707 10/13/21 ?6151 10/14/21 ?8343  ?PROCALCITON  --   --   --  4.86  --   --   ?WBC 28.4*  --  22.3*  --  10.9* 12.4*  ?LATICACIDVEN 2.9* 2.1*  --   --   --   --   ? ? ?Liver Function Tests: ?Recent Labs  ?Lab 10/11/21 ?1900  ?AST 92*  ?ALT 83*  ?ALKPHOS 142*  ?BILITOT 1.2  ?PROT 7.1  ?ALBUMIN 2.3*  ? ?No results for input(s): LIPASE, AMYLASE in the last 168 hours. ?No results for input(s): AMMONIA in the last 168 hours. ? ?ABG ?   ?Component Value Date/Time  ? PHART 7.45 10/13/2021 0334  ? PCO2ART 35 10/13/2021 0334  ? PO2ART 72 (L) 10/13/2021 0334  ? HCO3 24.3 10/13/2021 0334  ? O2SAT 97.6 10/13/2021 0334  ?  ? ?Coagulation Profile: ?Recent Labs  ?Lab 10/11/21 ?1900  ?INR 1.3*  ? ? ?Cardiac Enzymes: ?No results for input(s): CKTOTAL, CKMB, CKMBINDEX, TROPONINI in the last 168 hours. ? ?HbA1C: ?No results found for: HGBA1C ? ?CBG: ?Recent Labs  ?Lab 10/13/21 ?1612 10/13/21 ?1942 10/14/21 ?0017 10/14/21 ?7357 10/14/21 ?8978  ?GLUCAP 192* 144* 165* 164* 153*  ? ?Critical care time: 33 minutes ?  ? ? ?Roselie Awkward, MD ?[facility] ?Pager: [phone] ?Cell: [phone] ?After 7:00 pm call [facility]  [phone] ? ? ? ?

## 2021-10-14 NOTE — Progress Notes (Addendum)
eLink Physician-Brief Progress Note ?Patient Name: DAYLENE VANDENBOSCH ?DOB: Dec 03, 1945 ?MRN: [id number] ? ? ?Date of Service ? 10/14/2021  ?HPI/Events of Note ? Patient is agitated on the vent, trying to sit up.  Pt is on max dose of fentanyl gtt, given multiple doses of fentanyl.  Precedex held due to bradycardia.  Pt also given 1 dose of versed bolus earlier.  ? ?Pt looks uncomfortable and very restless on camera assessment.  ?eICU Interventions ? Give versed '2mg'$  IV.  ?Increase fentanyl gtt up to 431mg/hr.  ?Hold precedex gtt.   ? ? ? ?Intervention Category ?Minor Interventions: Agitation / anxiety - evaluation and management ? ?VElsie Lincoln?10/14/2021, 5:04 AM ? ?6:19 AM ?Pt gets agitated despite high dose of fentanyl gtt.  She is calm on my camera assessment but RN reports that she quickly gets riled up.  ? ?Plan> ?Versed IV PRN ordered.  ?Precedex gtt restarted at low rate with HR in the 70s.  Continue to monitor. ?

## 2021-10-15 DIAGNOSIS — J189 Pneumonia, unspecified organism: Secondary | ICD-10-CM | POA: Diagnosis not present

## 2021-10-15 DIAGNOSIS — J9601 Acute respiratory failure with hypoxia: Secondary | ICD-10-CM | POA: Diagnosis not present

## 2021-10-15 DIAGNOSIS — J432 Centrilobular emphysema: Secondary | ICD-10-CM | POA: Diagnosis not present

## 2021-10-15 LAB — GLUCOSE, CAPILLARY
Glucose-Capillary: 188 mg/dL — ABNORMAL HIGH (ref 70–99)
Glucose-Capillary: 168 mg/dL — ABNORMAL HIGH (ref 70–99)
Glucose-Capillary: 139 mg/dL — ABNORMAL HIGH (ref 70–99)
Glucose-Capillary: 170 mg/dL — ABNORMAL HIGH (ref 70–99)
Glucose-Capillary: 155 mg/dL — ABNORMAL HIGH (ref 70–99)
Glucose-Capillary: 163 mg/dL — ABNORMAL HIGH (ref 70–99)

## 2021-10-15 LAB — CBC WITH DIFFERENTIAL/PLATELET
Abs Immature Granulocytes: 0.11 10*3/uL — ABNORMAL HIGH (ref 0.00–0.07)
Basophils Absolute: 0 10*3/uL (ref 0.0–0.1)
Basophils Relative: 0 %
Eosinophils Absolute: 0 10*3/uL (ref 0.0–0.5)
Eosinophils Relative: 0 %
HCT: 37 % (ref 36.0–46.0)
Hemoglobin: 11.3 g/dL — ABNORMAL LOW (ref 12.0–15.0)
Immature Granulocytes: 1 %
Lymphocytes Relative: 2 %
Lymphs Abs: 0.2 10*3/uL — ABNORMAL LOW (ref 0.7–4.0)
MCH: 32.9 pg (ref 26.0–34.0)
MCHC: 30.5 g/dL (ref 30.0–36.0)
MCV: 107.9 fL — ABNORMAL HIGH (ref 80.0–100.0)
Monocytes Absolute: 0.3 10*3/uL (ref 0.1–1.0)
Monocytes Relative: 3 %
Neutro Abs: 8 10*3/uL — ABNORMAL HIGH (ref 1.7–7.7)
Neutrophils Relative %: 94 %
Platelets: 237 10*3/uL (ref 150–400)
RBC: 3.43 MIL/uL — ABNORMAL LOW (ref 3.87–5.11)
RDW: 14 % (ref 11.5–15.5)
WBC: 8.6 10*3/uL (ref 4.0–10.5)
nRBC: 0 % (ref 0.0–0.2)

## 2021-10-15 LAB — TRIGLYCERIDES: Triglycerides: 154 mg/dL — ABNORMAL HIGH (ref <150)

## 2021-10-15 LAB — COMPREHENSIVE METABOLIC PANEL
ALT: 233 U/L — ABNORMAL HIGH (ref 0–44)
AST: 163 U/L — ABNORMAL HIGH (ref 15–41)
Albumin: 1.8 g/dL — ABNORMAL LOW (ref 3.5–5.0)
Alkaline Phosphatase: 76 U/L (ref 38–126)
Anion gap: 6 (ref 5–15)
BUN: 63 mg/dL — ABNORMAL HIGH (ref 8–23)
CO2: 23 mmol/L (ref 22–32)
Calcium: 8.4 mg/dL — ABNORMAL LOW (ref 8.9–10.3)
Chloride: 114 mmol/L — ABNORMAL HIGH (ref 98–111)
Creatinine, Ser: 0.92 mg/dL (ref 0.44–1.00)
GFR, Estimated: >60 mL/min (ref >60)
Glucose, Bld: 199 mg/dL — ABNORMAL HIGH (ref 70–99)
Potassium: 4.4 mmol/L (ref 3.5–5.1)
Sodium: 143 mmol/L (ref 135–145)
Total Bilirubin: 0.4 mg/dL (ref 0.3–1.2)
Total Protein: 5.4 g/dL — ABNORMAL LOW (ref 6.5–8.1)

## 2021-10-15 LAB — CULTURE, RESPIRATORY W GRAM STAIN
Culture: NO GROWTH
Gram Stain: NONE SEEN
Special Requests: NORMAL

## 2021-10-15 MED ORDER — METHYLPREDNISOLONE SODIUM SUCC 40 MG IJ SOLR
40.0000 mg | Freq: Two times a day (BID) | INTRAMUSCULAR | Status: DC
  Administered 2021-10-15 – 2021-10-17 (×4): 40 mg via INTRAVENOUS
  Filled 2021-10-15 (×4): qty 1

## 2021-10-15 MED ORDER — BUDESONIDE 0.5 MG/2ML IN SUSP
0.5000 mg | Freq: Two times a day (BID) | RESPIRATORY_TRACT | Status: DC
Start: 2021-10-15 — End: 2021-10-25
  Administered 2021-10-15 – 2021-10-25 (×21): 0.5 mg via RESPIRATORY_TRACT
  Filled 2021-10-15 (×21): qty 2

## 2021-10-15 MED ORDER — ARFORMOTEROL TARTRATE 15 MCG/2ML IN NEBU
15.0000 ug | INHALATION_SOLUTION | Freq: Two times a day (BID) | RESPIRATORY_TRACT | Status: DC
  Administered 2021-10-15 – 2021-10-25 (×21): 15 ug via RESPIRATORY_TRACT
  Filled 2021-10-15 (×21): qty 2

## 2021-10-15 NOTE — Progress Notes (Signed)
eLink Physician-Brief Progress Note ?Patient Name: Gina Petty ?DOB: 14-Apr-1946 ?MRN: [id number] ? ? ?Date of Service ? 10/15/2021  ?HPI/Events of Note ? Bladder scan positive for 473 ml of urine.  ?eICU Interventions ? Foley catheter protocol ordered.  ? ? ? ?  ? ?Kerry Kass [name] ?10/15/2021, 2:48 AM ?

## 2021-10-15 NOTE — Progress Notes (Addendum)
? ?NAME:  ASA FATH, MRN:  [id number], DOB:  03/18/1946, LOS: 4 ?ADMISSION DATE:  10/11/2021, CONSULTATION DATE:  3/27 ?REFERRING MD:  Kommor , CHIEF COMPLAINT:  Dyspnea  ? ?History of Present Illness:  ?76 y/o female with an extensive smoking history admitted on 3/27 severe acute respiratory failure with hypoxemia due to severe community acquired pneumonia. ? ?Pertinent  Medical History  ?Hyperlipidemia ?Hypertension ?Depression ?Cigarette smoker> 35 pack year ? ?Significant Hospital Events: ?Including procedures, antibiotic start and stop dates in addition to other pertinent events   ?3/27 - Admitted via ED from home for SOB, dyspnea. CXR with multifocal PNA (R > L). Low-dose NE to maintain MAP goals. Placed on BiPAP. ?3/28 -  tachypnea (FiO2 45%), tachypneic to 30s. Off of NE.> intubated ? ?Micro ?3/27 sars cov 2/flu > neg ?3/27 resp viral panel >  ?3/27 blood > ng ?3/27 urine > multiple species ?3/28 BAL > ng ? ?Antibiotics ?3/27 cefepime >  ?3/27 azithro >  ? ?Interim History / Subjective:  ? ?Critically ill, intubated ?On propofol and fentanyl with  agitation when sedation decreased. ?Urine output adequate ?Afebrile ? ?Objective   ?Blood pressure (!) 154/61, pulse 80, temperature 97.9 ?F (36.6 ?C), temperature source Oral, resp. rate 18, height _0  (1.676 m), weight 88 kg, SpO2 96 %. ?   ?Vent Mode: PRVC ?FiO2 (%):  [40 %-50 %] 40 % ?Set Rate:  [18 bmp] 18 bmp ?Vt Set:  [470 mL] 470 mL ?PEEP:  [8 cmH20] 8 cmH20 ?Plateau Pressure:  [20 cmH20] 20 cmH20  ? ?Intake/Output Summary (Last 24 hours) at 10/15/2021 1051 ?Last data filed at 10/15/2021 1000 ?Gross per 24 hour  ?Intake 3613.93 ml  ?Output 1075 ml  ?Net 2538.93 ml  ? ? ?Filed Weights  ? 10/13/21 0500 10/14/21 0404 10/15/21 0500  ?Weight: 85.6 kg 84.9 kg 88 kg  ? ? ?Examination: ? ?General: Acutely ill appearing, elderly woman, no distress, intubated ?HENT: NCAT ETT in place ?PULM: No accessory muscle use, right basal crackles, no rhonchi ?CV: S1-S2  regular ?GI: BS+, soft, nontender ?MSK: normal bulk and tone ?Neuro: sedated on vent, RASS -3 ? ?Labs show normal electrolytes, elevated LFTs, no leukocytosis, stable mild anemia ? ?Chest x-ray 3/30 independently reviewed shows patchy infiltrates in both lower lung fields with small effusions, ET tube in position ? ?Resolved Hospital Problem list   ?AKI ? ?Assessment & Plan:  ?Acute hypoxemic respiratory failure due to CAP (POA) ?Start spontaneous breathing trials ?VAP prevention ?Daily WUA/SBT ?Continue cefepime and azithro x 5ds ? ?Centrilobular emphysema, noted on CT ?-Use Pulmicort and Brovana nebs ?-Albuterol as needed as needed ? ?Need for sedation for mechanical ventilation ?Severe ventilator dyssynchrony, periods of severe agitation ?PAD protocol to continue: RASS target 0 to -1, fentanyl + propofol ?Continue low dose oxycodone and clonezepam ? ?Elevated LFTs -unclear cause, follow, avoid hepatotoxins ? ?Hypertension ?Holding home Diovan at this point ? ?Depression ?Wellbutrin, Cymbalta on hold ? ?Cigarette smoker ?Counselling ? ? ?Best Practice (right click and "Reselect all SmartList Selections" daily)  ? ?Diet/type: tubefeeds ?DVT prophylaxis: LMWH ?GI prophylaxis: PPI ?Lines: N/A ?Foley:  N/A ?Code Status:  full code ?Last date of multidisciplinary goals of care discussion NA ,daughter [name] updated ? ?Labs   ?CBC: ?Recent Labs  ?Lab 10/11/21 ?1900 10/12/21 ?0239 10/13/21 ?7858 10/14/21 ?8502 10/15/21 ?7741  ?WBC 28.4* 22.3* 10.9* 12.4* 8.6  ?NEUTROABS 24.9*  --   --   --  8.0*  ?HGB 14.1 11.6* 12.4 11.8* 11.3*  ?  HCT 42.8 36.4 38.4 38.3 37.0  ?MCV 100.7* 104.6* 102.7* 106.4* 107.9*  ?PLT 307 243 204 222 237  ? ? ? ?Basic Metabolic Panel: ?Recent Labs  ?Lab 10/11/21 ?1900 10/12/21 ?0239 10/12/21 ?0165 10/12/21 ?1530 10/13/21 ?5374 10/13/21 ?1638 10/14/21 ?8270 10/14/21 ?7867 10/15/21 ?5449  ?NA 141 139  --   --  142  --   --  143 143  ?K 2.9* 3.6  --   --  3.8  --   --  4.6 4.4  ?CL 102 106  --   --  111   --   --  111 114*  ?CO2 26 24  --   --  23  --   --  24 23  ?GLUCOSE 160* 147*  --   --  178*  --   --  146* 199*  ?BUN 44* 48*  --   --  70*  --   --  69* 63*  ?CREATININE 1.29* 1.20* 1.24*  --  1.05*  --   --  1.02* 0.92  ?CALCIUM 9.3 8.5*  --   --  8.8*  --   --  8.5* 8.4*  ?MG  --  2.6*  --  2.8* 2.9* 2.7* 2.7*  --   --   ?PHOS  --  4.5  --  4.5 4.3 5.2* 5.4*  --   --   ? ? ?GFR: ?Estimated Creatinine Clearance: 60.9 mL/min (by C-G formula based on SCr of 0.92 mg/dL). ?Recent Labs  ?Lab 10/11/21 ?1900 10/11/21 ?2129 10/12/21 ?0239 10/12/21 ?2010 10/13/21 ?0712 10/14/21 ?1975 10/15/21 ?8832  ?PROCALCITON  --   --   --  4.86  --   --   --   ?WBC 28.4*  --  22.3*  --  10.9* 12.4* 8.6  ?LATICACIDVEN 2.9* 2.1*  --   --   --   --   --   ? ? ? ?Liver Function Tests: ?Recent Labs  ?Lab 10/11/21 ?1900 10/15/21 ?0313  ?AST 92* 163*  ?ALT 83* 233*  ?ALKPHOS 142* 76  ?BILITOT 1.2 0.4  ?PROT 7.1 5.4*  ?ALBUMIN 2.3* 1.8*  ? ? ?No results for input(s): LIPASE, AMYLASE in the last 168 hours. ?No results for input(s): AMMONIA in the last 168 hours. ? ?ABG ?   ?Component Value Date/Time  ? PHART 7.45 10/13/2021 0334  ? PCO2ART 35 10/13/2021 0334  ? PO2ART 72 (L) 10/13/2021 0334  ? HCO3 24.3 10/13/2021 0334  ? O2SAT 97.6 10/13/2021 0334  ? ?  ? ?Coagulation Profile: ?Recent Labs  ?Lab 10/11/21 ?1900  ?INR 1.3*  ? ? ? ?Cardiac Enzymes: ?No results for input(s): CKTOTAL, CKMB, CKMBINDEX, TROPONINI in the last 168 hours. ? ?HbA1C: ?No results found for: HGBA1C ? ?CBG: ?Recent Labs  ?Lab 10/14/21 ?1937 10/14/21 ?2327 10/15/21 ?0321 10/15/21 ?5498 10/15/21 ?0750  ?GLUCAP 182* 152* 188* 168* 139*  ? ? ?Critical care time: 32 minutes ?  ? ? ?Kara Mead MD. Shade Flood. ?[facility] ?Pager : [phone] ? ?If no response to pager , please call [phone] until 7 pm ?After 7:00 pm call Elink  [phone]  ? ?10/15/2021 ? ? ? ? ?

## 2021-10-16 ENCOUNTER — Inpatient Hospital Stay (HOSPITAL_COMMUNITY): Payer: Medicare Other

## 2021-10-16 DIAGNOSIS — J189 Pneumonia, unspecified organism: Secondary | ICD-10-CM | POA: Diagnosis not present

## 2021-10-16 DIAGNOSIS — J432 Centrilobular emphysema: Secondary | ICD-10-CM | POA: Diagnosis not present

## 2021-10-16 DIAGNOSIS — J9601 Acute respiratory failure with hypoxia: Secondary | ICD-10-CM | POA: Diagnosis not present

## 2021-10-16 LAB — BASIC METABOLIC PANEL
Anion gap: 4 — ABNORMAL LOW (ref 5–15)
BUN: 51 mg/dL — ABNORMAL HIGH (ref 8–23)
CO2: 27 mmol/L (ref 22–32)
Calcium: 8.4 mg/dL — ABNORMAL LOW (ref 8.9–10.3)
Chloride: 115 mmol/L — ABNORMAL HIGH (ref 98–111)
Creatinine, Ser: 0.76 mg/dL (ref 0.44–1.00)
GFR, Estimated: >60 mL/min (ref >60)
Glucose, Bld: 173 mg/dL — ABNORMAL HIGH (ref 70–99)
Potassium: 5.2 mmol/L — ABNORMAL HIGH (ref 3.5–5.1)
Sodium: 146 mmol/L — ABNORMAL HIGH (ref 135–145)

## 2021-10-16 LAB — GLUCOSE, CAPILLARY
Glucose-Capillary: 178 mg/dL — ABNORMAL HIGH (ref 70–99)
Glucose-Capillary: 148 mg/dL — ABNORMAL HIGH (ref 70–99)
Glucose-Capillary: 146 mg/dL — ABNORMAL HIGH (ref 70–99)
Glucose-Capillary: 146 mg/dL — ABNORMAL HIGH (ref 70–99)
Glucose-Capillary: 122 mg/dL — ABNORMAL HIGH (ref 70–99)
Glucose-Capillary: 120 mg/dL — ABNORMAL HIGH (ref 70–99)
Glucose-Capillary: 164 mg/dL — ABNORMAL HIGH (ref 70–99)

## 2021-10-16 LAB — CULTURE, BLOOD (ROUTINE X 2)
Culture: NO GROWTH
Culture: NO GROWTH
Special Requests: ADEQUATE

## 2021-10-16 LAB — MAGNESIUM: Magnesium: 2.8 mg/dL — ABNORMAL HIGH (ref 1.7–2.4)

## 2021-10-16 LAB — PHOSPHORUS: Phosphorus: 3.8 mg/dL (ref 2.5–4.6)

## 2021-10-16 MED ORDER — FREE WATER
200.0000 mL | Status: DC
  Administered 2021-10-16 – 2021-10-22 (×37): 200 mL

## 2021-10-16 MED ORDER — QUETIAPINE FUMARATE 50 MG PO TABS
50.0000 mg | ORAL_TABLET | Freq: Every day | ORAL | Status: DC
  Administered 2021-10-16 – 2021-10-20 (×5): 50 mg
  Filled 2021-10-16 (×6): qty 1

## 2021-10-16 NOTE — Progress Notes (Signed)
? ?NAME:  Gina Petty, MRN:  [id number], DOB:  1946/03/19, LOS: 5 ?ADMISSION DATE:  10/11/2021, CONSULTATION DATE:  3/27 ?REFERRING MD:  Kommor , CHIEF COMPLAINT:  Dyspnea  ? ?History of Present Illness:  ?76 y/o female with an extensive smoking history admitted on 3/27 severe acute respiratory failure with hypoxemia due to severe community acquired pneumonia. ? ?Pertinent  Medical History  ?Hyperlipidemia ?Hypertension ?Depression ?Cigarette smoker> 35 pack year ? ?Significant Hospital Events: ?Including procedures, antibiotic start and stop dates in addition to other pertinent events   ?3/27 - Admitted via ED from home for SOB, dyspnea. CXR with multifocal PNA (R > L). Low-dose NE to maintain MAP goals. Placed on BiPAP. ?3/28 -  tachypnea (FiO2 45%), tachypneic to 30s. Off of NE.> intubated ? ?Micro ?3/27 sars cov 2/flu > neg ?3/27 resp viral panel >  ?3/27 blood > ng ?3/27 urine > multiple species ?3/28 BAL > ng ? ?Antibiotics ?3/27 cefepime >  ?3/27 azithro >  ? ?3/31 increased agitation when sedation decreased ? ?Interim History / Subjective:  ? ?Remains critically ill, intubated ?Urine output 700 cc charted, +6 L ?Sedated with propofol and fentanyl ?Afebrile ? ?Objective   ?Blood pressure 137/61, pulse 72, temperature 98.7 ?F (37.1 ?C), temperature source Axillary, resp. rate 18, height _0  (1.676 m), weight 88 kg, SpO2 96 %. ?   ?Vent Mode: CPAP;PSV ?FiO2 (%):  [35 %-40 %] 35 % ?Set Rate:  [18 bmp] 18 bmp ?Vt Set:  [470 mL] 470 mL ?PEEP:  [5 cmH20-8 cmH20] 5 cmH20 ?Pressure Support:  [5 cmH20-10 cmH20] 5 cmH20 ?Plateau Pressure:  [14 cmH20-23 cmH20] 19 cmH20  ? ?Intake/Output Summary (Last 24 hours) at 10/16/2021 1020 ?Last data filed at 10/16/2021 0935 ?Gross per 24 hour  ?Intake 3173.74 ml  ?Output 700 ml  ?Net 2473.74 ml  ? ? ?Filed Weights  ? 10/13/21 0500 10/14/21 0404 10/15/21 0500  ?Weight: 85.6 kg 84.9 kg 88 kg  ? ? ?Examination: ? ?General: Acutely ill appearing, elderly woman, no distress,  intubated ?HENT: NCAT ETT in place ?PULM: No accessory muscle use, right basal crackles, no rhonchi ?CV: S1-S2 regular ?GI: BS+, soft, nontender ?MSK: normal bulk and tone ?Neuro: sedated on vent, RASS -3 ? ?Labs show mild hypernatremia, mild hyperkalemia ? ?Chest x-ray 4/1 independently reviewed -bilateral lower lobe opacities/atelectasis unchanged ? ?Resolved Hospital Problem list   ?AKI ? ?Assessment & Plan:  ?Acute hypoxemic respiratory failure due to CAP (POA) ?Start spontaneous breathing trials , tolerates 10/5 x 1 hour then got tired ?VAP prevention ?Daily WUA/SBT ?Continue cefepime and azithro x 5ds ? ?Centrilobular emphysema, noted on CT ?-Use Pulmicort and Brovana nebs ?-Albuterol as needed as needed ? ?Need for sedation for mechanical ventilation ?Severe ventilator dyssynchrony, periods of severe agitation ?PAD protocol to continue: RASS target 0 to -1, fentanyl + propofol ?Continue low dose oxycodone and clonezepam ?We will add low-dose Seroquel ?Precedex because bradycardia ? ?Elevated LFTs -unclear cause, follow, avoid hepatotoxins ? ?Hypertension ?Holding home Diovan at this point ? ?Depression ?Wellbutrin, Cymbalta on hold ? ?Cigarette smoker ?Counselling ? ?Summary -slow wean due to underlying COPD and superimposed agitation ? ?Best Practice (right click and "Reselect all SmartList Selections" daily)  ? ?Diet/type: tubefeeds ?DVT prophylaxis: LMWH ?GI prophylaxis: PPI ?Lines: N/A ?Foley:  N/A ?Code Status:  full code ?Last date of multidisciplinary goals of care discussion NA ,daughter [name] updated ? ?Labs   ?CBC: ?Recent Labs  ?Lab 10/11/21 ?1900 10/12/21 ?0239 10/13/21 ?0434 10/14/21 ?5956 10/15/21 ?  8110  ?WBC 28.4* 22.3* 10.9* 12.4* 8.6  ?NEUTROABS 24.9*  --   --   --  8.0*  ?HGB 14.1 11.6* 12.4 11.8* 11.3*  ?HCT 42.8 36.4 38.4 38.3 37.0  ?MCV 100.7* 104.6* 102.7* 106.4* 107.9*  ?PLT 307 243 204 222 237  ? ? ? ?Basic Metabolic Panel: ?Recent Labs  ?Lab 10/12/21 ?0239 10/12/21 ?3159  10/12/21 ?1530 10/13/21 ?4585 10/13/21 ?1638 10/14/21 ?9292 10/14/21 ?4462 10/15/21 ?8638 10/16/21 ?0301  ?NA 139  --   --  142  --   --  143 143 146*  ?K 3.6  --   --  3.8  --   --  4.6 4.4 5.2*  ?CL 106  --   --  111  --   --  111 114* 115*  ?CO2 24  --   --  23  --   --  _0 ?GLUCOSE 147*  --   --  178*  --   --  146* 199* 173*  ?BUN 48*  --   --  70*  --   --  69* 63* 51*  ?CREATININE 1.20* 1.24*  --  1.05*  --   --  1.02* 0.92 0.76  ?CALCIUM 8.5*  --   --  8.8*  --   --  8.5* 8.4* 8.4*  ?MG 2.6*  --  2.8* 2.9* 2.7* 2.7*  --   --  2.8*  ?PHOS 4.5  --  4.5 4.3 5.2* 5.4*  --   --  3.8  ? ? ?GFR: ?Estimated Creatinine Clearance: 70 mL/min (by C-G formula based on SCr of 0.76 mg/dL). ?Recent Labs  ?Lab 10/11/21 ?1900 10/11/21 ?2129 10/12/21 ?0239 10/12/21 ?1771 10/13/21 ?1657 10/14/21 ?9038 10/15/21 ?3338  ?PROCALCITON  --   --   --  4.86  --   --   --   ?WBC 28.4*  --  22.3*  --  10.9* 12.4* 8.6  ?LATICACIDVEN 2.9* 2.1*  --   --   --   --   --   ? ? ? ?Liver Function Tests: ?Recent Labs  ?Lab 10/11/21 ?1900 10/15/21 ?0313  ?AST 92* 163*  ?ALT 83* 233*  ?ALKPHOS 142* 76  ?BILITOT 1.2 0.4  ?PROT 7.1 5.4*  ?ALBUMIN 2.3* 1.8*  ? ? ?No results for input(s): LIPASE, AMYLASE in the last 168 hours. ?No results for input(s): AMMONIA in the last 168 hours. ? ?ABG ?   ?Component Value Date/Time  ? PHART 7.45 10/13/2021 0334  ? PCO2ART 35 10/13/2021 0334  ? PO2ART 72 (L) 10/13/2021 0334  ? HCO3 24.3 10/13/2021 0334  ? O2SAT 97.6 10/13/2021 0334  ? ?  ? ?Coagulation Profile: ?Recent Labs  ?Lab 10/11/21 ?1900  ?INR 1.3*  ? ? ? ?Cardiac Enzymes: ?No results for input(s): CKTOTAL, CKMB, CKMBINDEX, TROPONINI in the last 168 hours. ? ?HbA1C: ?No results found for: HGBA1C ? ?CBG: ?Recent Labs  ?Lab 10/15/21 ?1603 10/15/21 ?2018 10/16/21 ?0114 10/16/21 ?3291 10/16/21 ?0751  ?GLUCAP 155* 163* 178* 148* 146*  ? ? ?Critical care time: 34 minutes ?  ? ? ?Kara Mead MD. Shade Flood. ?[facility] ?Pager : [phone]  [phone] ? ?If no response to pager , please call [phone] until 7 pm ?After 7:00 pm call Elink  [phone]  ? ?10/16/2021 ? ? ? ? ?

## 2021-10-16 NOTE — Progress Notes (Signed)
Pt's RR was in the high 30's on 5/5. RT placed the Pt on 10/5 and she did better. RT will continue tomonito ?

## 2021-10-17 DIAGNOSIS — J189 Pneumonia, unspecified organism: Secondary | ICD-10-CM | POA: Diagnosis not present

## 2021-10-17 DIAGNOSIS — J9601 Acute respiratory failure with hypoxia: Secondary | ICD-10-CM | POA: Diagnosis not present

## 2021-10-17 LAB — COMPREHENSIVE METABOLIC PANEL
ALT: 146 U/L — ABNORMAL HIGH (ref 0–44)
AST: 44 U/L — ABNORMAL HIGH (ref 15–41)
Albumin: 1.8 g/dL — ABNORMAL LOW (ref 3.5–5.0)
Alkaline Phosphatase: 56 U/L (ref 38–126)
Anion gap: <3 — ABNORMAL LOW (ref 5–15)
BUN: 42 mg/dL — ABNORMAL HIGH (ref 8–23)
CO2: 28 mmol/L (ref 22–32)
Calcium: 8.2 mg/dL — ABNORMAL LOW (ref 8.9–10.3)
Chloride: 112 mmol/L — ABNORMAL HIGH (ref 98–111)
Creatinine, Ser: 0.56 mg/dL (ref 0.44–1.00)
GFR, Estimated: >60 mL/min (ref >60)
Glucose, Bld: 123 mg/dL — ABNORMAL HIGH (ref 70–99)
Potassium: 5.3 mmol/L — ABNORMAL HIGH (ref 3.5–5.1)
Sodium: 142 mmol/L (ref 135–145)
Total Bilirubin: 0.9 mg/dL (ref 0.3–1.2)
Total Protein: 5 g/dL — ABNORMAL LOW (ref 6.5–8.1)

## 2021-10-17 LAB — GLUCOSE, CAPILLARY
Glucose-Capillary: 110 mg/dL — ABNORMAL HIGH (ref 70–99)
Glucose-Capillary: 134 mg/dL — ABNORMAL HIGH (ref 70–99)
Glucose-Capillary: 143 mg/dL — ABNORMAL HIGH (ref 70–99)
Glucose-Capillary: 146 mg/dL — ABNORMAL HIGH (ref 70–99)
Glucose-Capillary: 87 mg/dL (ref 70–99)
Glucose-Capillary: 97 mg/dL (ref 70–99)

## 2021-10-17 MED ORDER — FUROSEMIDE 10 MG/ML IJ SOLN
40.0000 mg | Freq: Once | INTRAMUSCULAR | Status: AC
Start: 2021-10-17 — End: 2021-10-17
  Administered 2021-10-17: 40 mg via INTRAVENOUS
  Filled 2021-10-17: qty 4

## 2021-10-17 MED ORDER — SODIUM CHLORIDE 0.9 % IV SOLN
2.0000 g | Freq: Three times a day (TID) | INTRAVENOUS | Status: AC
  Administered 2021-10-17 – 2021-10-19 (×6): 2 g via INTRAVENOUS
  Filled 2021-10-17 (×6): qty 2

## 2021-10-17 MED ORDER — METHYLPREDNISOLONE SODIUM SUCC 40 MG IJ SOLR
40.0000 mg | Freq: Every day | INTRAMUSCULAR | Status: AC
  Administered 2021-10-18 – 2021-10-24 (×7): 40 mg via INTRAVENOUS
  Filled 2021-10-17 (×7): qty 1

## 2021-10-17 NOTE — Progress Notes (Addendum)
? ?NAME:  Gina Petty, MRN:  [id number], DOB:  [birth year], LOS: 6 ?ADMISSION DATE:  10/11/2021, CONSULTATION DATE:  3/27 ?REFERRING MD:  Kommor , CHIEF COMPLAINT:  Dyspnea  ? ?History of Present Illness:  ?76 y/o female with an extensive smoking history admitted on 3/27 severe acute respiratory failure with hypoxemia due to severe community acquired pneumonia. ? ?Pertinent  Medical History  ?Hyperlipidemia ?Hypertension ?Depression ?Cigarette smoker> 35 pack year ? ?Significant Hospital Events: ?Including procedures, antibiotic start and stop dates in addition to other pertinent events   ?3/27 - Admitted via ED from home for SOB, dyspnea. CXR with multifocal PNA (R > L). Low-dose NE to maintain MAP goals. Placed on BiPAP. ?3/28 -  tachypnea (FiO2 45%), tachypneic to 30s. Off of NE.> intubated ? ?Micro ?3/27 sars cov 2/flu > neg ?3/27 resp viral panel >  ?3/27 blood > ng ?3/27 urine > multiple species ?3/28 BAL > ng ? ?Antibiotics ?3/27 cefepime >  ?3/27 azithro >  ? ?3/31 increased agitation when sedation decreased ? ?Interim History / Subjective:  ? ?Remains critically ill, intubated. ?Tolerated pressure support 10/5 for 2 hours before desaturating yesterday. ?PEEP increased to 8 overnight ?+9 L ? ?Objective   ?Blood pressure (!) 150/62, pulse 84, temperature 98.7 ?F (37.1 ?C), temperature source Axillary, resp. rate (!) 23, height _0  (1.676 m), weight 88 kg, SpO2 96 %. ?   ?Vent Mode: PRVC ?FiO2 (%):  [35 %] 35 % ?Set Rate:  [18 bmp] 18 bmp ?Vt Set:  [470 mL] 470 mL ?PEEP:  [8 cmH20] 8 cmH20 ?Pressure Support:  [8 cmH20] 8 cmH20 ?Plateau Pressure:  [16 cmH20-20 cmH20] 17 cmH20  ? ?Intake/Output Summary (Last 24 hours) at 10/17/2021 0941 ?Last data filed at 10/17/2021 1607 ?Gross per 24 hour  ?Intake 1788.51 ml  ?Output 1075 ml  ?Net 713.51 ml  ? ? ?Filed Weights  ? 10/13/21 0500 10/14/21 0404 10/15/21 0500  ?Weight: 85.6 kg 84.9 kg 88 kg  ? ? ?Examination: ? ?General: Acutely ill appearing, elderly woman, no  distress, intubated ?HENT: NCAT ETT in place ?PULM: No rhonchi, bilateral ventilated breath sounds, no accessory muscle use ?CV: S1-S2 regular ?GI: BS+, soft, nontender ?MSK: normal bulk and tone ?Neuro: sedated on vent, RASS -2 , nonspecific agitation when drips lower ? ?Labs show mild hypokalemia, albumin 1.8 , LFTs decreased ? ?Chest x-ray 4/1 independently reviewed -bilateral lower lobe opacities/atelectasis unchanged ? ?Resolved Hospital Problem list   ?AKI ? ?Assessment & Plan:  ?Acute hypoxemic respiratory failure due to CAP (POA) ?Continue spontaneous breathing trials , making some progress on pressure support ?Drop PEEP to 5, FiO2 up to 50% acceptable ?VAP prevention ?Daily WUA/SBT ?Continue cefepime x 7ds ? ?Centrilobular emphysema, noted on CT ?-Use Pulmicort and Brovana nebs ?-Albuterol as needed as needed ?-Decrease Solu-Medrol 40 daily ? ?Need for sedation for mechanical ventilation ?Severe ventilator dyssynchrony, periods of severe agitation ?PAD protocol to continue: RASS target 0 to -1, fentanyl + propofol ?Continue low dose oxycodone and clonezepam ?Added low-dose Seroquel/1 ?Did not tolerate Precedex due to bradycardia ? ?Elevated LFTs -decreasing, unclear cause, follow, avoid hepatotoxins ? ?Hypertension ?Holding home Diovan at this point ? ?Depression ?Wellbutrin, Cymbalta on hold ? ?Atrophic uterus - outpatient follow up ? ?Cigarette smoker ?Counselling ? ?Summary -slow wean due to underlying COPD and superimposed agitation ? ?Best Practice (right click and "Reselect all SmartList Selections" daily)  ? ?Diet/type: tubefeeds ?DVT prophylaxis: LMWH ?GI prophylaxis: PPI ?Lines: N/A ?Foley:  N/A ?Code Status:  full code ?Last date of multidisciplinary goals of care discussion NA ,daughter [name] updated ? ?Labs   ?CBC: ?Recent Labs  ?Lab 10/11/21 ?1900 10/12/21 ?0239 10/13/21 ?3846 10/14/21 ?6599 10/15/21 ?3570  ?WBC 28.4* 22.3* 10.9* 12.4* 8.6  ?NEUTROABS 24.9*  --   --   --  8.0*  ?HGB 14.1 11.6*  12.4 11.8* 11.3*  ?HCT 42.8 36.4 38.4 38.3 37.0  ?MCV 100.7* 104.6* 102.7* 106.4* 107.9*  ?PLT 307 243 204 222 237  ? ? ? ?Basic Metabolic Panel: ?Recent Labs  ?Lab 10/12/21 ?1530 10/13/21 ?1779 10/13/21 ?1638 10/14/21 ?3903 10/14/21 ?0092 10/15/21 ?3300 10/16/21 ?0301 10/17/21 ?0301  ?NA  --  142  --   --  143 143 146* 142  ?K  --  3.8  --   --  4.6 4.4 5.2* 5.3*  ?CL  --  111  --   --  111 114* 115* 112*  ?CO2  --  23  --   --  _0 ?GLUCOSE  --  178*  --   --  146* 199* 173* 123*  ?BUN  --  70*  --   --  69* 63* 51* 42*  ?CREATININE  --  1.05*  --   --  1.02* 0.92 0.76 0.56  ?CALCIUM  --  8.8*  --   --  8.5* 8.4* 8.4* 8.2*  ?MG 2.8* 2.9* 2.7* 2.7*  --   --  2.8*  --   ?PHOS 4.5 4.3 5.2* 5.4*  --   --  3.8  --   ? ? ?GFR: ?Estimated Creatinine Clearance: 70 mL/min (by C-G formula based on SCr of 0.56 mg/dL). ?Recent Labs  ?Lab 10/11/21 ?1900 10/11/21 ?2129 10/12/21 ?0239 10/12/21 ?7622 10/13/21 ?6333 10/14/21 ?5456 10/15/21 ?2563  ?PROCALCITON  --   --   --  4.86  --   --   --   ?WBC 28.4*  --  22.3*  --  10.9* 12.4* 8.6  ?LATICACIDVEN 2.9* 2.1*  --   --   --   --   --   ? ? ? ?Liver Function Tests: ?Recent Labs  ?Lab 10/11/21 ?1900 10/15/21 ?0313 10/17/21 ?0301  ?AST 92* 163* 44*  ?ALT 83* 233* 146*  ?ALKPHOS 142* 76 56  ?BILITOT 1.2 0.4 0.9  ?PROT 7.1 5.4* 5.0*  ?ALBUMIN 2.3* 1.8* 1.8*  ? ? ?No results for input(s): LIPASE, AMYLASE in the last 168 hours. ?No results for input(s): AMMONIA in the last 168 hours. ? ?ABG ?   ?Component Value Date/Time  ? PHART 7.45 10/13/2021 0334  ? PCO2ART 35 10/13/2021 0334  ? PO2ART 72 (L) 10/13/2021 0334  ? HCO3 24.3 10/13/2021 0334  ? O2SAT 97.6 10/13/2021 0334  ? ?  ? ?Coagulation Profile: ?Recent Labs  ?Lab 10/11/21 ?1900  ?INR 1.3*  ? ? ? ?Cardiac Enzymes: ?No results for input(s): CKTOTAL, CKMB, CKMBINDEX, TROPONINI in the last 168 hours. ? ?HbA1C: ?No results found for: HGBA1C ? ?CBG: ?Recent Labs  ?Lab 10/16/21 ?1615 10/16/21 ?1954 10/16/21 ?2322 10/17/21 ?0403  10/17/21 ?8937  ?GLUCAP 122* 120* 164* 110* 143*  ? ? ?Critical care time: 33 minutes ?  ? ? ?Kara Mead MD. Shade Flood. ?[facility] ?Pager : [phone] ? ?If no response to pager , please call [phone] until 7 pm ?After 7:00 pm call Elink  [phone]  ? ?10/17/2021 ? ? ? ? ?

## 2021-10-17 NOTE — Progress Notes (Signed)
Pt found on assessment to be guppy breathing with a RR of 34, diaphoretic,and agitated. Pt suctioned, fentanyl turned back on to 68mg, and bolus given. Pt now resting with RR 18-20.  ?

## 2021-10-17 NOTE — Progress Notes (Signed)
Removed patient rings alongside RN due to swelling. Placed in Patient Valuables Envelope and in patients chart.  ?

## 2021-10-18 ENCOUNTER — Inpatient Hospital Stay (HOSPITAL_COMMUNITY): Payer: Medicare Other

## 2021-10-18 DIAGNOSIS — I5030 Unspecified diastolic (congestive) heart failure: Secondary | ICD-10-CM

## 2021-10-18 DIAGNOSIS — J189 Pneumonia, unspecified organism: Secondary | ICD-10-CM | POA: Diagnosis not present

## 2021-10-18 LAB — ECHOCARDIOGRAM COMPLETE
AR max vel: 2.4 cm2
AV Area VTI: 2.14 cm2
AV Area mean vel: 2.3 cm2
AV Mean grad: 6 mmHg
AV Peak grad: 11.4 mmHg
Ao pk vel: 1.69 m/s
Area-P 1/2: 3.53 cm2
Height: 66 in
S' Lateral: 3.1 cm
Weight: 3414.48 oz

## 2021-10-18 LAB — GLUCOSE, CAPILLARY
Glucose-Capillary: 101 mg/dL — ABNORMAL HIGH (ref 70–99)
Glucose-Capillary: 102 mg/dL — ABNORMAL HIGH (ref 70–99)
Glucose-Capillary: 115 mg/dL — ABNORMAL HIGH (ref 70–99)
Glucose-Capillary: 142 mg/dL — ABNORMAL HIGH (ref 70–99)
Glucose-Capillary: 168 mg/dL — ABNORMAL HIGH (ref 70–99)
Glucose-Capillary: 85 mg/dL (ref 70–99)

## 2021-10-18 LAB — BASIC METABOLIC PANEL
Anion gap: 5 (ref 5–15)
BUN: 40 mg/dL — ABNORMAL HIGH (ref 8–23)
CO2: 28 mmol/L (ref 22–32)
Calcium: 8 mg/dL — ABNORMAL LOW (ref 8.9–10.3)
Chloride: 106 mmol/L (ref 98–111)
Creatinine, Ser: 0.53 mg/dL (ref 0.44–1.00)
GFR, Estimated: >60 mL/min (ref >60)
Glucose, Bld: 109 mg/dL — ABNORMAL HIGH (ref 70–99)
Potassium: 4.7 mmol/L (ref 3.5–5.1)
Sodium: 139 mmol/L (ref 135–145)

## 2021-10-18 LAB — MAGNESIUM: Magnesium: 2.3 mg/dL (ref 1.7–2.4)

## 2021-10-18 LAB — TRIGLYCERIDES: Triglycerides: 99 mg/dL (ref <150)

## 2021-10-18 LAB — PHOSPHORUS: Phosphorus: 3.7 mg/dL (ref 2.5–4.6)

## 2021-10-18 LAB — PATHOLOGIST SMEAR REVIEW

## 2021-10-18 MED ORDER — ALBUMIN HUMAN 25 % IV SOLN
25.0000 g | Freq: Four times a day (QID) | INTRAVENOUS | Status: AC
  Administered 2021-10-18 (×2): 25 g via INTRAVENOUS
  Filled 2021-10-18 (×2): qty 100

## 2021-10-18 MED ORDER — FUROSEMIDE 10 MG/ML IJ SOLN
60.0000 mg | Freq: Three times a day (TID) | INTRAMUSCULAR | Status: DC
  Administered 2021-10-18 – 2021-10-19 (×4): 60 mg via INTRAVENOUS
  Filled 2021-10-18 (×4): qty 6

## 2021-10-18 NOTE — Progress Notes (Signed)
? ?  NAME:  Gina Petty, MRN:  [id number], DOB:  10-04-1945, LOS: 7 ?ADMISSION DATE:  10/11/2021, CONSULTATION DATE:  3/27 ?REFERRING MD:  Kommor , CHIEF COMPLAINT:  Dyspnea  ? ?History of Present Illness:  ?76 y/o female with an extensive smoking history admitted on 3/27 severe acute respiratory failure with hypoxemia due to severe community acquired pneumonia. ? ?Pertinent  Medical History  ?Hyperlipidemia ?Hypertension ?Depression ?Cigarette smoker> 35 pack year ? ?Significant Hospital Events: ?Including procedures, antibiotic start and stop dates in addition to other pertinent events   ?3/27 - Admitted via ED from home for SOB, dyspnea. CXR with multifocal PNA (R > L). Low-dose NE to maintain MAP goals. Placed on BiPAP. ?3/28 -  tachypnea (FiO2 45%), tachypneic to 30s. Off of NE.> intubated ? ?Micro ?3/27 sars cov 2/flu > neg ?3/27 resp viral panel >  ?3/27 blood > ng ?3/27 urine > multiple species ?3/28 BAL > ng ? ?Antibiotics ?3/27 cefepime >  ?3/27 azithro >  ? ?3/31 increased agitation when sedation decreased ? ?Interim History / Subjective:  ?On vent ?Dyssynchrony and desats with sedation lightening. ? ?Objective   ?Blood pressure (!) 131/53, pulse 86, temperature 99.7 ?F (37.6 ?C), temperature source Axillary, resp. rate 19, height _0  (1.676 m), weight 96.8 kg, SpO2 100 %. ?   ?Vent Mode: PRVC ?FiO2 (%):  [35 %] 35 % ?Set Rate:  [18 bmp] 18 bmp ?Vt Set:  [470 mL] 470 mL ?PEEP:  [5 cmH20] 5 cmH20 ?Pressure Support:  [8 cmH20] 8 cmH20 ?Plateau Pressure:  [14 cmH20-18 cmH20] 14 cmH20  ? ?Intake/Output Summary (Last 24 hours) at 10/18/2021 0842 ?Last data filed at 10/18/2021 0556 ?Gross per 24 hour  ?Intake 997.87 ml  ?Output 2790 ml  ?Net -1792.13 ml  ? ? ?Filed Weights  ? 10/14/21 0404 10/15/21 0500 10/18/21 0500  ?Weight: 84.9 kg 88 kg 96.8 kg  ? ? ?Examination: ?Chronically ill appearing woman on vent ?Using accessory muscles, lungs diminished bases, bedside US with consolidation and minimal effusion ?Ext with  generalized anasarca ?Withdraws x 4 ?Not following commands ? ?Renal function okay ?No CBC  ?CXR still looks wet, R effusion looks smaller ? ?Resolved Hospital Problem list   ?AKI ? ?Assessment & Plan:  ?Acute hypoxemic respiratory failure- due to bilateral CAP on top of severe baseline emphysema. ?Sedation and ICU related metabolic encephalopathy ?Volume overloaded state of heart ?Acute liver injury- question congestive hepatopathy ?Hx anxiety/depression ? ?- Continue vent support, sedation titrated to vent synchrony ?- Abx, steroids as ordered ?- Push diuresis today. Needs to be more dry before we can liberate from vent ?- Check echo, nonurgent, want to see what her RV looks like ? ?Best Practice (right click and "Reselect all SmartList Selections" daily)  ? ?Diet/type: tubefeeds ?DVT prophylaxis: LMWH ?GI prophylaxis: PPI ?Lines: N/A ?Foley:  N/A ?Code Status:  full code ?Last date of multidisciplinary goals of care discussion: daughter updated 4/1 ? ?31 min cc time ?Erskine Emery MD PCCM ? ? ? ? ?

## 2021-10-18 NOTE — Progress Notes (Signed)
Echocardiogram ?2D Echocardiogram has been performed. ? ?Arlyss Gandy ?10/18/2021, 2:27 PM ?

## 2021-10-19 ENCOUNTER — Inpatient Hospital Stay (HOSPITAL_COMMUNITY): Payer: Medicare Other

## 2021-10-19 DIAGNOSIS — J189 Pneumonia, unspecified organism: Secondary | ICD-10-CM | POA: Diagnosis not present

## 2021-10-19 LAB — GLUCOSE, CAPILLARY
Glucose-Capillary: 123 mg/dL — ABNORMAL HIGH (ref 70–99)
Glucose-Capillary: 139 mg/dL — ABNORMAL HIGH (ref 70–99)
Glucose-Capillary: 153 mg/dL — ABNORMAL HIGH (ref 70–99)
Glucose-Capillary: 172 mg/dL — ABNORMAL HIGH (ref 70–99)
Glucose-Capillary: 148 mg/dL — ABNORMAL HIGH (ref 70–99)
Glucose-Capillary: 105 mg/dL — ABNORMAL HIGH (ref 70–99)

## 2021-10-19 LAB — HEPATIC FUNCTION PANEL
ALT: 80 U/L — ABNORMAL HIGH (ref 0–44)
AST: 28 U/L (ref 15–41)
Albumin: 2.9 g/dL — ABNORMAL LOW (ref 3.5–5.0)
Alkaline Phosphatase: 55 U/L (ref 38–126)
Bilirubin, Direct: 0.2 mg/dL (ref 0.0–0.2)
Indirect Bilirubin: 0.5 mg/dL (ref 0.3–0.9)
Total Bilirubin: 0.7 mg/dL (ref 0.3–1.2)
Total Protein: 5.8 g/dL — ABNORMAL LOW (ref 6.5–8.1)

## 2021-10-19 LAB — BASIC METABOLIC PANEL
Anion gap: 8 (ref 5–15)
BUN: 38 mg/dL — ABNORMAL HIGH (ref 8–23)
CO2: 33 mmol/L — ABNORMAL HIGH (ref 22–32)
Calcium: 8.6 mg/dL — ABNORMAL LOW (ref 8.9–10.3)
Chloride: 98 mmol/L (ref 98–111)
Creatinine, Ser: 0.7 mg/dL (ref 0.44–1.00)
GFR, Estimated: >60 mL/min (ref >60)
Glucose, Bld: 136 mg/dL — ABNORMAL HIGH (ref 70–99)
Potassium: 3.9 mmol/L (ref 3.5–5.1)
Sodium: 139 mmol/L (ref 135–145)

## 2021-10-19 LAB — BODY FLUID CELL COUNT WITH DIFFERENTIAL
Eos, Fluid: 2 %
Lymphs, Fluid: 5 %
Monocyte-Macrophage-Serous Fluid: 9 % — ABNORMAL LOW (ref 50–90)
Neutrophil Count, Fluid: 84 % — ABNORMAL HIGH (ref 0–25)
Total Nucleated Cell Count, Fluid: 300 cu mm (ref 0–1000)

## 2021-10-19 LAB — CBC
HCT: 37.4 % (ref 36.0–46.0)
Hemoglobin: 11.4 g/dL — ABNORMAL LOW (ref 12.0–15.0)
MCH: 32.6 pg (ref 26.0–34.0)
MCHC: 30.5 g/dL (ref 30.0–36.0)
MCV: 106.9 fL — ABNORMAL HIGH (ref 80.0–100.0)
Platelets: 277 10*3/uL (ref 150–400)
RBC: 3.5 MIL/uL — ABNORMAL LOW (ref 3.87–5.11)
RDW: 13.5 % (ref 11.5–15.5)
WBC: 8.8 10*3/uL (ref 4.0–10.5)
nRBC: 0 % (ref 0.0–0.2)

## 2021-10-19 LAB — MAGNESIUM: Magnesium: 2.2 mg/dL (ref 1.7–2.4)

## 2021-10-19 LAB — PHOSPHORUS: Phosphorus: 4.2 mg/dL (ref 2.5–4.6)

## 2021-10-19 MED ORDER — ETOMIDATE 2 MG/ML IV SOLN
INTRAVENOUS | Status: AC
  Administered 2021-10-19: 20 mg
  Filled 2021-10-19: qty 20

## 2021-10-19 MED ORDER — ROCURONIUM BROMIDE 10 MG/ML (PF) SYRINGE
PREFILLED_SYRINGE | INTRAVENOUS | Status: AC
  Filled 2021-10-19: qty 10

## 2021-10-19 MED ORDER — ETOMIDATE 2 MG/ML IV SOLN
INTRAVENOUS | Status: AC
  Filled 2021-10-19: qty 10

## 2021-10-19 MED ORDER — ETOMIDATE 2 MG/ML IV SOLN
20.0000 mg | Freq: Once | INTRAVENOUS | Status: AC
  Administered 2021-10-19: 20 mg via INTRAVENOUS

## 2021-10-19 MED ORDER — FENTANYL CITRATE (PF) 100 MCG/2ML IJ SOLN
INTRAMUSCULAR | Status: AC
  Filled 2021-10-19: qty 2

## 2021-10-19 MED ORDER — SUCCINYLCHOLINE CHLORIDE 200 MG/10ML IV SOSY
PREFILLED_SYRINGE | INTRAVENOUS | Status: AC
  Administered 2021-10-19: 100 mg
  Filled 2021-10-19: qty 10

## 2021-10-19 MED ORDER — MIDAZOLAM HCL 2 MG/2ML IJ SOLN
INTRAMUSCULAR | Status: AC
Start: 2021-10-19 — End: 2021-10-20
  Filled 2021-10-19: qty 2

## 2021-10-19 NOTE — Progress Notes (Signed)
Updated daughter by phone. ? ?Erskine Emery MD PCCM ?

## 2021-10-19 NOTE — Procedures (Signed)
Bronchoscopy Procedure Note ? ?Gina Petty  ?[id number]  ?20-Dec-1945 ? ?Date:10/19/21  ?Time:3:41 PM  ? ?Provider Performing:Asherah Lavoy C Tamala Julian  ? ?Procedure(s):  Flexible bronchoscopy with bronchial alveolar lavage (40347) and Initial Therapeutic Aspiration of Tracheobronchial Tree (42595) ? ?Indication(s) ?Check ETT, failure to wean from vent ? ?Consent ?Unable to obtain consent due to emergent nature of procedure. ? ?Anesthesia ?In place for tube exchange ? ? ?Time Out ?Verified patient identification, verified procedure, site/side was marked, verified correct patient position, special equipment/implants available, medications/allergies/relevant history reviewed, required imaging and test results available. ? ? ?Sterile Technique ?Usual hand hygiene, masks, gowns, and gloves were used ? ? ?Procedure Description ?Bronchoscope advanced through endotracheal tube and into airway.  Airways were examined down to subsegmental level with findings noted below.   ?Following diagnostic evaluation, BAL(s) performed in LLL with normal saline and return of clear-cloudy fluid and Therapeutic aspiration performed in L mainstem and LLL ? ?Findings:  ?- ETT was deep in R mainstem (despite being same distance as old ETT ?maybe was low intermittently during wean limiting SBT?) ?- Mucoid impaction of left mainstem suctioned: looked clear ?- LLL BAL looked pretty clear as well just thick mucoid return ? ?Hopefully these interventions address her inability to wean ? ?Complications/Tolerance ?None; patient tolerated the procedure well. ?Chest X-ray is not needed post procedure. ? ? ?EBL ?Minimal ? ? ?Specimen(s) ?LLL BAL ? ?

## 2021-10-19 NOTE — Progress Notes (Signed)
eLink Physician-Brief Progress Note ?Patient Name: Gina Petty ?DOB: 11-11-1945 ?MRN: [id number] ? ? ?Date of Service ? 10/19/2021  ?HPI/Events of Note ? Agitation - Nursing request for bilateral soft wrist restraints.  ?eICU Interventions ? Will order bilateral soft wrist restraints X 13 hours.   ? ? ? ?Intervention Category ?Major Interventions: Delirium, psychosis, severe agitation - evaluation and management ? ?Calypso Hagarty Cornelia Copa ?10/19/2021, 8:24 PM ?

## 2021-10-19 NOTE — Procedures (Addendum)
Intubation Procedure Note ? ?Gina Petty  ?[id number]  ?Sep 14, 1945 ? ?Date:10/19/21  ?Time:3:39 PM  ? ?Provider Performing:Champagne Paletta C Tamala Julian  ? ? ?Procedure: Intubation (31517) ? ?Indication(s) ?Respiratory Failure, suspected occlusion of ETT ? ?Consent ?Emergent ? ? ?Anesthesia ?Etomidate, Fentanyl, Succinylcholine, and Propofol ? ? ?Time Out ?Verified patient identification, verified procedure, site/side was marked, verified correct patient position, special equipment/implants available, medications/allergies/relevant history reviewed, required imaging and test results available. ? ? ?Sterile Technique ?Usual hand hygeine, masks, and gloves were used ? ? ?Procedure Description ?Cuff deflated, 7-5 tube removed and 8-0 tube advanced, cuff inflated.  Bronch confirmed placement after.  Inspissated secretions noted along old ETT likely explaining intermittent severe peak pressure issues as patient woke up. ? ? ?Complications/Tolerance ?None; patient tolerated the procedure well. ? ? ?EBL ?Minimal ? ? ?Specimen(s) ?None ? ?

## 2021-10-19 NOTE — Progress Notes (Signed)
Nutrition Follow-up ? ?DOCUMENTATION CODES:  ? ?Not applicable ? ?INTERVENTION:  ?- continue Osmolite 1.2 at goal rate of 55 ml/hr with 45 ml Prosource TF TID and 200 ml free water every 4 hours. ? ? ?NUTRITION DIAGNOSIS:  ? ?Inadequate oral intake related to inability to eat as evidenced by NPO status. -ongoing ? ?GOAL:  ? ?Patient will meet greater than or equal to 90% of their needs -met with TF regimen ? ?MONITOR:  ? ?Vent status, TF tolerance, Labs, Weight trends, I & O's ? ? ?ASSESSMENT:  ? ?76 year old female with medical history of HTN, PE, depression, and arthritis. She presented to the ED from home with 1 week hx of progressive shortness of breath with dyspnea on exertion, and worsening cough. She also had 72 hours of fever and diarrhea and reported feeling nauseated in the ED but no emesis PTA or since admission. In the ED she required BiPAP and was admitted for acute respiratory failure and transferred to the ICU where she was intubated on 3/28 afternoon. ? ?Patient discussed in rounds and with RN and RT briefly at bedside. ? ?Patient remains intubated with OGT in place. She is receiving Osmolite 1.2 at goal rate of 55 ml/hr with 45 ml Prosource TF TID and 200 ml free water every 4 hours. This regimen is providing 1704 kcal, 106 grams protein, and 2282 ml free water. ? ?Weight has been fluctuating throughout hospitalization with weight today being consistent with admission weight. Non-pitting edema to all extremities documented in the edema section of flow sheet as recently as 2000 yesterday. She is noted to be -865 ml since admission.  ? ?No visitors present at the time of RD visit.  ? ? ?Patient is currently intubated on ventilator support ?MV: 8.3 L/min ?Temp (24hrs), Avg:98.9 ?F (37.2 ?C), Min:97.8 ?F (36.6 ?C), Max:99.8 ?F (37.7 ?C) ?Propofol: 7.6 ml/hr (201 kcal/24 hrs) ?BP: 121/52 and MAP: 73 ? ?Labs reviewed; CBGs: 123 and 139 mg/dl, BUN: 38 mg/dl, Ca: 8.6 mg/dl, ALT: elevated. ? ?Medications  reviewed; 100 mg colace BID, 25 g albumin x2 doses 4/3, sliding scale novolog, 40 mg solu-medrol/day (4/3-4/9), 40 mg protonix/day, 17 g miralax/day. ? ?Drips; fentanyl @ 150 mcg/hr, propofol @ 15 mcg/kg/min. ? ? ?Diet Order:   ?Diet Order   ? ?       ?  Diet NPO time specified  Diet effective now       ?  ? ?  ?  ? ?  ? ? ?EDUCATION NEEDS:  ? ?No education needs have been identified at this time ? ?Skin:  Skin Assessment: Skin Integrity Issues: ?Skin Integrity Issues:: Stage I ?Stage I: bilateral coccyx ? ?Last BM:  4/3 (type 7, small) ? ?Height:  ? ?Ht Readings from Last 1 Encounters:  ?10/13/21 '5\' 6"'  (1.676 m)  ? ? ?Weight:  ? ?Wt Readings from Last 1 Encounters:  ?10/19/21 89.2 kg  ? ? ? ?BMI:  Body mass index is 31.74 kg/m?. ? ?Estimated Nutritional Needs:  ?Kcal:  1703 kcal ?Protein:  103-128 grams ?Fluid:  >/= 2 L/day ? ? ? ? ?Jarome Matin, MS, RD, LDN ?Registered Dietitian II ?Inpatient Clinical Nutrition ?RD pager # and on-call/weekend pager # available in [place]  ? ?

## 2021-10-19 NOTE — Progress Notes (Signed)
? ?  NAME:  Gina Petty, MRN:  [id number], DOB:  1945-10-21, LOS: 8 ?ADMISSION DATE:  10/11/2021, CONSULTATION DATE:  3/27 ?REFERRING MD:  Kommor , CHIEF COMPLAINT:  Dyspnea  ? ?History of Present Illness:  ?76 y/o female with an extensive smoking history admitted on 3/27 severe acute respiratory failure with hypoxemia due to severe community acquired pneumonia. ? ?Pertinent  Medical History  ?Hyperlipidemia ?Hypertension ?Depression ?Cigarette smoker> 35 pack year ? ?Significant Hospital Events: ?Including procedures, antibiotic start and stop dates in addition to other pertinent events   ?3/27 - Admitted via ED from home for SOB, dyspnea. CXR with multifocal PNA (R > L). Low-dose NE to maintain MAP goals. Placed on BiPAP. ?3/28 -  tachypnea (FiO2 45%), tachypneic to 30s. Off of NE.> intubated ? ?Micro ?3/27 sars cov 2/flu > neg ?3/27 resp viral panel >  ?3/27 blood > ng ?3/27 urine > multiple species ?3/28 BAL > ng ? ?Antibiotics ?3/27 cefepime >  ?3/27 azithro >  ? ?3/31 increased agitation when sedation decreased ? ?Interim History / Subjective:  ?Diuresed 8L yesterday. ?Starting SAT. ? ?Objective   ?Blood pressure 130/83, pulse 93, temperature 97.8 ?F (36.6 ?C), temperature source Axillary, resp. rate (!) 29, height _0  (1.676 m), weight 89.2 kg, SpO2 99 %. ?   ?Vent Mode: PRVC ?FiO2 (%):  [35 %] 35 % ?Set Rate:  [18 bmp] 18 bmp ?Vt Set:  [470 mL] 470 mL ?PEEP:  [5 cmH20] 5 cmH20 ?Plateau Pressure:  [15 WUJ81-19 cmH20] 17 cmH20  ? ?Intake/Output Summary (Last 24 hours) at 10/19/2021 0819 ?Last data filed at 10/19/2021 0700 ?Gross per 24 hour  ?Intake 861.62 ml  ?Output 9250 ml  ?Net -8388.38 ml  ? ? ?Filed Weights  ? 10/15/21 0500 10/18/21 0500 10/19/21 0500  ?Weight: 88 kg 96.8 kg 89.2 kg  ? ? ?Examination: ?Chronically ill appearing woman on vent ?Triggering vent ?Ext less swollen ?Heart sounds regular, ext warm ? ?Renal function slightly higher ?CBC stable ?No CXR ?Echo personally reviewed: biventricular function  looks okay ? ?Resolved Hospital Problem list   ?AKI ? ?Assessment & Plan:  ?Acute hypoxemic respiratory failure- due to bilateral CAP on top of severe baseline emphysema.  Lingering issue may have been volume overload ?Sedation and ICU related metabolic encephalopathy ?Volume overloaded state of heart ?Acute liver injury- question congestive hepatopathy, improved ?Hx anxiety/depression ? ?- Wean sedation today, can use precedex as a bridge, see if we can get her off vent ?- Continue CAP coverage, nebs, and steroids as ordered ?- Assess mental status better once off vent ?- Daughter updated at length yesterday, patient will need to have [place] talk with family to discuss POA, EOL issues etc, has been hesitant to date ? ?Best Practice (right click and "Reselect all SmartList Selections" daily)  ? ?Diet/type: tubefeeds ?DVT prophylaxis: LMWH ?GI prophylaxis: PPI ?Lines: N/A ?Foley:  N/A ?Code Status:  full code ?Last date of multidisciplinary goals of care discussion: daughter updated 4/3 ? ?33 min cc time ?Erskine Emery MD PCCM ? ? ? ? ?

## 2021-10-19 NOTE — Progress Notes (Signed)
RT NOTE: ? ?Pt was placed on wean mode PSV/CPAP at 1118. Pt had to switched back to full support mode PRVC at 1124 due respiratory rate of 50 and saturation of 85%.  ?

## 2021-10-20 ENCOUNTER — Inpatient Hospital Stay (HOSPITAL_COMMUNITY): Payer: Medicare Other

## 2021-10-20 DIAGNOSIS — J9691 Respiratory failure, unspecified with hypoxia: Secondary | ICD-10-CM | POA: Diagnosis not present

## 2021-10-20 DIAGNOSIS — J189 Pneumonia, unspecified organism: Secondary | ICD-10-CM | POA: Diagnosis not present

## 2021-10-20 LAB — HEPATIC FUNCTION PANEL
ALT: 67 U/L — ABNORMAL HIGH (ref 0–44)
AST: 36 U/L (ref 15–41)
Albumin: 2.7 g/dL — ABNORMAL LOW (ref 3.5–5.0)
Alkaline Phosphatase: 57 U/L (ref 38–126)
Bilirubin, Direct: 0.3 mg/dL — ABNORMAL HIGH (ref 0.0–0.2)
Indirect Bilirubin: 0.7 mg/dL (ref 0.3–0.9)
Total Bilirubin: 1 mg/dL (ref 0.3–1.2)
Total Protein: 5.8 g/dL — ABNORMAL LOW (ref 6.5–8.1)

## 2021-10-20 LAB — GLUCOSE, CAPILLARY
Glucose-Capillary: 118 mg/dL — ABNORMAL HIGH (ref 70–99)
Glucose-Capillary: 107 mg/dL — ABNORMAL HIGH (ref 70–99)
Glucose-Capillary: 132 mg/dL — ABNORMAL HIGH (ref 70–99)
Glucose-Capillary: 131 mg/dL — ABNORMAL HIGH (ref 70–99)
Glucose-Capillary: 119 mg/dL — ABNORMAL HIGH (ref 70–99)

## 2021-10-20 LAB — BLOOD GAS, ARTERIAL
Acid-Base Excess: 20.4 mmol/L — ABNORMAL HIGH (ref 0.0–2.0)
Bicarbonate: 46 mmol/L — ABNORMAL HIGH (ref 20.0–28.0)
Drawn by: 331471
FIO2: 60 %
MECHVT: 470 mL
O2 Saturation: 99.3 %
PEEP: 5 cmH2O
Patient temperature: 37
RATE: 18 resp/min
pCO2 arterial: 55 mmHg — ABNORMAL HIGH (ref 32–48)
pH, Arterial: 7.53 — ABNORMAL HIGH (ref 7.35–7.45)
pO2, Arterial: 75 mmHg — ABNORMAL LOW (ref 83–108)

## 2021-10-20 LAB — CBC
HCT: 35.9 % — ABNORMAL LOW (ref 36.0–46.0)
Hemoglobin: 11.4 g/dL — ABNORMAL LOW (ref 12.0–15.0)
MCH: 32.8 pg (ref 26.0–34.0)
MCHC: 31.8 g/dL (ref 30.0–36.0)
MCV: 103.2 fL — ABNORMAL HIGH (ref 80.0–100.0)
Platelets: 329 10*3/uL (ref 150–400)
RBC: 3.48 MIL/uL — ABNORMAL LOW (ref 3.87–5.11)
RDW: 13.4 % (ref 11.5–15.5)
WBC: 12.8 10*3/uL — ABNORMAL HIGH (ref 4.0–10.5)
nRBC: 0 % (ref 0.0–0.2)

## 2021-10-20 LAB — BASIC METABOLIC PANEL
Anion gap: 7 (ref 5–15)
BUN: 37 mg/dL — ABNORMAL HIGH (ref 8–23)
CO2: 35 mmol/L — ABNORMAL HIGH (ref 22–32)
Calcium: 8.4 mg/dL — ABNORMAL LOW (ref 8.9–10.3)
Chloride: 97 mmol/L — ABNORMAL LOW (ref 98–111)
Creatinine, Ser: 0.6 mg/dL (ref 0.44–1.00)
GFR, Estimated: >60 mL/min (ref >60)
Glucose, Bld: 122 mg/dL — ABNORMAL HIGH (ref 70–99)
Potassium: 4.5 mmol/L (ref 3.5–5.1)
Sodium: 139 mmol/L (ref 135–145)

## 2021-10-20 LAB — MAGNESIUM: Magnesium: 2.2 mg/dL (ref 1.7–2.4)

## 2021-10-20 LAB — PHOSPHORUS: Phosphorus: 3.2 mg/dL (ref 2.5–4.6)

## 2021-10-20 MED ORDER — MIDAZOLAM HCL 2 MG/2ML IJ SOLN
2.0000 mg | Freq: Once | INTRAMUSCULAR | Status: AC
  Administered 2021-10-20: 2 mg via INTRAVENOUS

## 2021-10-20 MED ORDER — ETOMIDATE 2 MG/ML IV SOLN: 10.0000 mg | Freq: Once | INTRAVENOUS | Status: AC

## 2021-10-20 MED ORDER — ETOMIDATE 2 MG/ML IV SOLN
INTRAVENOUS | Status: AC
  Administered 2021-10-20: 10 mg via INTRAVENOUS
  Filled 2021-10-20: qty 20

## 2021-10-20 MED ORDER — FENTANYL CITRATE PF 50 MCG/ML IJ SOSY: 50.0000 ug | PREFILLED_SYRINGE | Freq: Once | INTRAMUSCULAR | Status: AC

## 2021-10-20 MED ORDER — FENTANYL CITRATE (PF) 100 MCG/2ML IJ SOLN
INTRAMUSCULAR | Status: AC
  Administered 2021-10-20: 100 ug
  Filled 2021-10-20: qty 2

## 2021-10-20 MED ORDER — ROCURONIUM BROMIDE 50 MG/5ML IV SOLN
50.0000 mg | Freq: Once | INTRAVENOUS | Status: AC
  Filled 2021-10-20: qty 5

## 2021-10-20 MED ORDER — ROCURONIUM BROMIDE 10 MG/ML (PF) SYRINGE
PREFILLED_SYRINGE | INTRAVENOUS | Status: AC
  Administered 2021-10-20: 50 mg via INTRAVENOUS
  Filled 2021-10-20: qty 10

## 2021-10-20 MED ORDER — PHENYLEPHRINE 40 MCG/ML (10ML) SYRINGE FOR IV PUSH (FOR BLOOD PRESSURE SUPPORT)
PREFILLED_SYRINGE | INTRAVENOUS | Status: AC
  Filled 2021-10-20: qty 10

## 2021-10-20 MED ORDER — DEXMEDETOMIDINE HCL IN NACL 400 MCG/100ML IV SOLN
0.4000 ug/kg/h | INTRAVENOUS | Status: DC
  Administered 2021-10-20 – 2021-10-21 (×2): 0.4 ug/kg/h via INTRAVENOUS
  Administered 2021-10-21: 0.6 ug/kg/h via INTRAVENOUS
  Administered 2021-10-22: 0.4 ug/kg/h via INTRAVENOUS
  Administered 2021-10-22: 0.7 ug/kg/h via INTRAVENOUS
  Administered 2021-10-22: 0.6 ug/kg/h via INTRAVENOUS
  Administered 2021-10-23 (×2): 0.4 ug/kg/h via INTRAVENOUS
  Administered 2021-10-24 – 2021-10-25 (×5): 0.7 ug/kg/h via INTRAVENOUS
  Administered 2021-10-25: 0.8 ug/kg/h via INTRAVENOUS
  Filled 2021-10-20 (×16): qty 100

## 2021-10-20 NOTE — Procedures (Signed)
Extubation Procedure Note ? ?Patient Details:   ?Name: Gina Petty ?DOB: June 12, 1946 ?MRN: [id number] ?  ?Airway Documentation:  ?  ?Vent end date: 10/20/21 Vent end time: 0840  ? ?Evaluation ? O2 sats: stable throughout ?Complications: No apparent complications ?Patient did tolerate procedure well. ?Bilateral Breath Sounds: Diminished, Rhonchi ?  ?No ? ?Pt was extubated to [place] and 100% per CCMD order. Pt was suctioned and had a positive cuff leak prior to extubation. Pt was not able to speak afterwards but was able to wiggle toes and squeeze RT hands on command, MD Tamala Julian was made aware of this. No stridor heard at this time, vitals are stable at this time. RT will continue to monitor pt status.  ? ?[name] ?10/20/2021, 8:40 AM ? ?

## 2021-10-20 NOTE — Progress Notes (Signed)
? ?  NAME:  Gina Petty, MRN:  [id number], DOB:  [birth year], LOS: 9 ?ADMISSION DATE:  10/11/2021, CONSULTATION DATE:  3/27 ?REFERRING MD:  Kommor , CHIEF COMPLAINT:  Dyspnea  ? ?History of Present Illness:  ?77 y/o female with an extensive smoking history admitted on 3/27 severe acute respiratory failure with hypoxemia due to severe community acquired pneumonia. ? ?Pertinent  Medical History  ?Hyperlipidemia ?Hypertension ?Depression ?Cigarette smoker> 35 pack year ? ?Significant Hospital Events: ?Including procedures, antibiotic start and stop dates in addition to other pertinent events   ?3/27 - Admitted via ED from home for SOB, dyspnea. CXR with multifocal PNA (R > L). Low-dose NE to maintain MAP goals. Placed on BiPAP. ?3/28 -  tachypnea (FiO2 45%), tachypneic to 30s. Off of NE.> intubated ? ?Micro ?3/27 sars cov 2/flu > neg ?3/27 resp viral panel >  ?3/27 blood > ng ?3/27 urine > multiple species ?3/28 BAL > ng ? ?Antibiotics ?3/27 cefepime >  ?3/27 azithro >  ? ?3/31 increased agitation when sedation decreased ? ?Interim History / Subjective:  ?No events. ?Sedated on vent. ? ?Objective   ?Blood pressure (!) 118/56, pulse 98, temperature 99.2 ?F (37.3 ?C), temperature source Axillary, resp. rate 18, height _0  (1.676 m), weight 89.5 kg, SpO2 97 %. ?   ?Vent Mode: PRVC ?FiO2 (%):  [30 %-50 %] 50 % ?Set Rate:  [18 bmp] 18 bmp ?Vt Set:  [470 mL] 470 mL ?PEEP:  [5 cmH20-10 cmH20] 10 cmH20 ?Pressure Support:  [8 cmH20] 8 cmH20 ?Plateau Pressure:  [14 cmH20-21 cmH20] 20 cmH20  ? ?Intake/Output Summary (Last 24 hours) at 10/20/2021 0753 ?Last data filed at 10/20/2021 0701 ?Gross per 24 hour  ?Intake 437.4 ml  ?Output 1650 ml  ?Net -1212.6 ml  ? ? ?Filed Weights  ? 10/18/21 0500 10/19/21 0500 10/20/21 0428  ?Weight: 96.8 kg 89.2 kg 89.5 kg  ? ? ?Examination: ?Chronically ill woman sedated on vent ?Pupiuls equal ?Anasarca improved ?Lungs diminished bases ?Ext warm ?Withdraws x4 ? ?Renal function stable ?WBC up  slightly ? ?Resolved Hospital Problem list   ?AKI ? ?Assessment & Plan:  ?Acute hypoxemic respiratory failure- due to bilateral CAP on top of severe baseline emphysema.  Lingering issue may have been volume overload ?Sedation and ICU related metabolic encephalopathy ?Volume overloaded state of heart- improved ?Acute liver injury- question congestive hepatopathy, improved ?Hx anxiety/depression- complicating sedation weans ? ?- Switch to precedex and trial of extubation ?- Continue abx, steroids, nebs as ordered ?- Hopefully the ETT explains her severe agitation with sedation weans ? ?Best Practice (right click and "Reselect all SmartList Selections" daily)  ? ?Diet/type: tubefeeds ?DVT prophylaxis: LMWH ?GI prophylaxis: PPI ?Lines: N/A ?Foley:  N/A ?Code Status:  full code ?Last date of multidisciplinary goals of care discussion: daughter updated 4/4 ? ?32 min cc time ?Erskine Emery MD PCCM ? ? ? ? ?

## 2021-10-20 NOTE — Progress Notes (Signed)
RT NOTE: ? ?Pt transported to and from MRI via ventilator with no complications noted.  ?

## 2021-10-20 NOTE — Progress Notes (Signed)
PT Cancellation Note ? ?Patient Details ?Name: KELICIA YOUTZ ?MRN: [id number] ?DOB: [birth year] ? ? ?Cancelled Treatment:    Reason Eval/Treat Not Completed: Medical issues which prohibited therapy (pt on ventilator. Will follow.) ? ? ?Blondell Reveal Kistler PT 10/20/2021  ?Acute Rehabilitation Services ?Pager [phone] ?Office [phone] ? ?

## 2021-10-20 NOTE — TOC Progression Note (Signed)
Transition of Care (TOC) - Progression Note  ? ? ?Patient Details  ?Name: Gina Petty ?MRN: [id number] ?Date of Birth: 15-Aug-1945 ? ?Transition of Care (TOC) CM/SW Contact  ?Tawanna Cooler, RN ?Phone Number: ?10/20/2021, 1:31 PM ? ?Clinical Narrative:    ? ?Patient remains medically unstable.   ?TOC will continue to follow for discharge planning needs. ?

## 2021-10-20 NOTE — Procedures (Signed)
Intubation Procedure Note ? ?Gina Petty  ?[id number]  ?Jul 28, 1945 ? ?Date:10/20/21  ?Time:9:45 AM  ? ?Provider Performing:Brooke Moshe Cipro and Dr. Tamala Julian ? ? ?Procedure: Intubation (14970) ? ?Indication(s) ?Respiratory Failure, inability to protect airway ? ?Consent ?Unable to obtain consent due to emergent nature of procedure. ? ? ?Anesthesia ?Etomidate, Versed, Fentanyl, and Rocuronium ? ? ?Time Out ?Verified patient identification, verified procedure, site/side was marked, verified correct patient position, special equipment/implants available, medications/allergies/relevant history reviewed, required imaging and test results available. ? ? ?Sterile Technique ?Usual hand hygeine, masks, and gloves were used ? ? ?Procedure Description ?Patient positioned in bed supine.  Sedation given as noted above.  Patient was intubated with endotracheal tube using Glidescope 4.  View was Grade 1 full glottis .  Number of attempts was 1.  8 ETT placed to 21 at teeth. Colorimetric CO2 detector was consistent with tracheal placement. ? ? ?Complications/Tolerance ?None; patient tolerated the procedure well. ?Chest X-ray is ordered to verify placement. ? ? ?EBL ?N/a ? ? ?Specimen(s) ?None ? ? ? ? ?Kennieth Rad, ACNP ?[facility] ?10/20/2021, 9:46 AM ? ?See Amion for pager ?If no response to pager, please call PCCM consult pager ?After 7:00 pm call Elink   ? ? ?

## 2021-10-20 NOTE — Progress Notes (Signed)
PCCM Interval Note ? ?Daughter, [name] called and updated on negative MRI.  As of right now, she has not been able to reach her brother (who lives in [place] and she lives in [place]) to discuss tracheostomy and they have not been able to find any advance directives.  All questions answered.   ? ? ? ? ?Kennieth Rad, ACNP ?[facility] ?10/20/2021, 4:54 PM ? ?See Amion for pager ?If no response to pager, please call PCCM consult pager ?After 7:00 pm call Elink   ? ?

## 2021-10-21 ENCOUNTER — Inpatient Hospital Stay (HOSPITAL_COMMUNITY): Payer: Medicare Other

## 2021-10-21 DIAGNOSIS — J189 Pneumonia, unspecified organism: Secondary | ICD-10-CM | POA: Diagnosis not present

## 2021-10-21 LAB — BASIC METABOLIC PANEL
Anion gap: 8 (ref 5–15)
BUN: 39 mg/dL — ABNORMAL HIGH (ref 8–23)
CO2: 33 mmol/L — ABNORMAL HIGH (ref 22–32)
Calcium: 8.7 mg/dL — ABNORMAL LOW (ref 8.9–10.3)
Chloride: 99 mmol/L (ref 98–111)
Creatinine, Ser: 0.63 mg/dL (ref 0.44–1.00)
GFR, Estimated: >60 mL/min (ref >60)
Glucose, Bld: 121 mg/dL — ABNORMAL HIGH (ref 70–99)
Potassium: 4.1 mmol/L (ref 3.5–5.1)
Sodium: 140 mmol/L (ref 135–145)

## 2021-10-21 LAB — MAGNESIUM: Magnesium: 2.4 mg/dL (ref 1.7–2.4)

## 2021-10-21 LAB — CBC
HCT: 41.4 % (ref 36.0–46.0)
Hemoglobin: 12.7 g/dL (ref 12.0–15.0)
MCH: 32.6 pg (ref 26.0–34.0)
MCHC: 30.7 g/dL (ref 30.0–36.0)
MCV: 106.2 fL — ABNORMAL HIGH (ref 80.0–100.0)
Platelets: 269 10*3/uL (ref 150–400)
RBC: 3.9 MIL/uL (ref 3.87–5.11)
RDW: 13.4 % (ref 11.5–15.5)
WBC: 9.5 10*3/uL (ref 4.0–10.5)
nRBC: 0 % (ref 0.0–0.2)

## 2021-10-21 LAB — GLUCOSE, CAPILLARY
Glucose-Capillary: 107 mg/dL — ABNORMAL HIGH (ref 70–99)
Glucose-Capillary: 116 mg/dL — ABNORMAL HIGH (ref 70–99)
Glucose-Capillary: 120 mg/dL — ABNORMAL HIGH (ref 70–99)
Glucose-Capillary: 189 mg/dL — ABNORMAL HIGH (ref 70–99)
Glucose-Capillary: 87 mg/dL (ref 70–99)
Glucose-Capillary: 88 mg/dL (ref 70–99)
Glucose-Capillary: 99 mg/dL (ref 70–99)

## 2021-10-21 LAB — TRIGLYCERIDES: Triglycerides: 81 mg/dL (ref <150)

## 2021-10-21 LAB — HEPATIC FUNCTION PANEL
ALT: 67 U/L — ABNORMAL HIGH (ref 0–44)
AST: 34 U/L (ref 15–41)
Albumin: 2.8 g/dL — ABNORMAL LOW (ref 3.5–5.0)
Alkaline Phosphatase: 62 U/L (ref 38–126)
Bilirubin, Direct: 0.2 mg/dL (ref 0.0–0.2)
Indirect Bilirubin: 0.5 mg/dL (ref 0.3–0.9)
Total Bilirubin: 0.7 mg/dL (ref 0.3–1.2)
Total Protein: 6.2 g/dL — ABNORMAL LOW (ref 6.5–8.1)

## 2021-10-21 LAB — PHOSPHORUS: Phosphorus: 3.9 mg/dL (ref 2.5–4.6)

## 2021-10-21 MED ORDER — ROCURONIUM BROMIDE 10 MG/ML (PF) SYRINGE
100.0000 mg | PREFILLED_SYRINGE | Freq: Once | INTRAVENOUS | Status: AC
  Administered 2021-10-21: 70 mg via INTRAVENOUS
  Filled 2021-10-21: qty 10

## 2021-10-21 MED ORDER — FENTANYL BOLUS VIA INFUSION: 25.0000 ug | INTRAVENOUS | Status: DC | PRN

## 2021-10-21 MED ORDER — PHENYLEPHRINE 40 MCG/ML (10ML) SYRINGE FOR IV PUSH (FOR BLOOD PRESSURE SUPPORT): 120.0000 ug | PREFILLED_SYRINGE | Freq: Once | INTRAVENOUS | Status: AC

## 2021-10-21 MED ORDER — ENOXAPARIN SODIUM 40 MG/0.4ML IJ SOSY
40.0000 mg | PREFILLED_SYRINGE | INTRAMUSCULAR | Status: DC
  Administered 2021-10-22 – 2021-10-25 (×4): 40 mg via SUBCUTANEOUS
  Filled 2021-10-21 (×4): qty 0.4

## 2021-10-21 MED ORDER — MIDAZOLAM HCL 2 MG/2ML IJ SOLN
5.0000 mg | Freq: Once | INTRAMUSCULAR | Status: DC
  Filled 2021-10-21: qty 6

## 2021-10-21 MED ORDER — ETOMIDATE 2 MG/ML IV SOLN
20.0000 mg | Freq: Once | INTRAVENOUS | Status: AC
  Administered 2021-10-21: 20 mg via INTRAVENOUS
  Filled 2021-10-21: qty 10

## 2021-10-21 MED ORDER — QUETIAPINE FUMARATE 50 MG PO TABS
25.0000 mg | ORAL_TABLET | Freq: Two times a day (BID) | ORAL | Status: DC
Start: 2021-10-21 — End: 2021-10-24
  Administered 2021-10-21 – 2021-10-23 (×6): 25 mg
  Filled 2021-10-21 (×6): qty 1

## 2021-10-21 MED ORDER — PHENYLEPHRINE 40 MCG/ML (10ML) SYRINGE FOR IV PUSH (FOR BLOOD PRESSURE SUPPORT)
PREFILLED_SYRINGE | INTRAVENOUS | Status: AC
  Administered 2021-10-21: 120 ug via INTRAVENOUS
  Filled 2021-10-21: qty 10

## 2021-10-21 MED ORDER — FENTANYL CITRATE PF 50 MCG/ML IJ SOSY: 25.0000 ug | PREFILLED_SYRINGE | INTRAMUSCULAR | Status: DC | PRN

## 2021-10-21 MED ORDER — FENTANYL CITRATE (PF) 100 MCG/2ML IJ SOLN
200.0000 ug | Freq: Once | INTRAMUSCULAR | Status: AC
  Administered 2021-10-21: 100 ug via INTRAVENOUS
  Filled 2021-10-21: qty 4

## 2021-10-21 NOTE — Evaluation (Signed)
SLP Cancellation Note ? ?Patient Details ?Name: Gina Petty ?MRN: [id number] ?DOB: Sep 05, 1945 ? ? ?Cancelled treatment:       Reason Eval/Treat Not Completed: Other (comment) (pt just trached and is on vent at this time; will continue efforts) ? ? ?Macario Golds ?10/21/2021, 1:09 PM ?Kathleen Lime, MS [facility] SLP ?Acute Rehab Services ?Office [phone] ?Pager [phone] ? ? ? ?

## 2021-10-21 NOTE — Progress Notes (Signed)
? ?  NAME:  Gina Petty, MRN:  [id number], DOB:  [birth year], LOS: 10 ?ADMISSION DATE:  10/11/2021, CONSULTATION DATE:  3/27 ?REFERRING MD:  Kommor , CHIEF COMPLAINT:  Dyspnea  ? ?History of Present Illness:  ?76 y/o female with an extensive smoking history admitted on 3/27 severe acute respiratory failure with hypoxemia due to severe community acquired pneumonia. ? ?Pertinent  Medical History  ?Hyperlipidemia ?Hypertension ?Depression ?Cigarette smoker> 35 pack year ? ?Significant Hospital Events: ?Including procedures, antibiotic start and stop dates in addition to other pertinent events   ?3/27 - Admitted via ED from home for SOB, dyspnea. CXR with multifocal PNA (R > L). Low-dose NE to maintain MAP goals. Placed on BiPAP. ?3/28 -  tachypnea (FiO2 45%), tachypneic to 30s. Off of NE.> intubated ? ?Micro ?3/27 sars cov 2/flu > neg ?3/27 resp viral panel >  ?3/27 blood > ng ?3/27 urine > multiple species ?3/28 BAL > ng ? ?Antibiotics ?3/27 cefepime >  ?3/27 azithro >  ? ?3/31 increased agitation when sedation decreased ? ?Interim History / Subjective:  ?No events. ?Sedated on vent. ?MRI results reviewed. ? ?Objective   ?Blood pressure (!) 99/57, pulse 74, temperature 100.2 ?F (37.9 ?C), temperature source Axillary, resp. rate 20, height _0  (1.676 m), weight 88.5 kg, SpO2 97 %. ?   ?Vent Mode: PRVC ?FiO2 (%):  [40 %-60 %] 40 % ?Set Rate:  [2 bmp-18 bmp] 2 bmp ?Vt Set:  [470 mL] 470 mL ?PEEP:  [5 cmH20] 5 cmH20 ?Plateau Pressure:  [15 cmH20-16 cmH20] 15 cmH20  ? ?Intake/Output Summary (Last 24 hours) at 10/21/2021 1003 ?Last data filed at 10/21/2021 9417 ?Gross per 24 hour  ?Intake 393.17 ml  ?Output 1750 ml  ?Net -1356.83 ml  ? ? ?Filed Weights  ? 10/19/21 0500 10/20/21 0428 10/21/21 0453  ?Weight: 89.2 kg 89.5 kg 88.5 kg  ? ? ?Examination: ?Chronically ill woman sedated on vent ?Pupils equal ?No edema ?Lungs diminished bases, triggers vent ?Ext warm ?Withdraws x4 ? ?Renal function good ?I/O is related to pump malfunction  yesterday that was not charting her FWF, she is actually still positive for admission, however she appears euvolemic ? ?CBC benign ? ?Resolved Hospital Problem list   ?AKI ? ?Assessment & Plan:  ?Acute hypoxemic respiratory failure- due to bilateral CAP on top of severe baseline emphysema.  Completed course of cerfepime.  Lingering issue may have been volume overload.  Attempted extubation 4/5, unable to handle secretions and delirious but moving all 4 ext.  MRI head benign.  We are about a week in and I think with time and sedation lightening she will do much better.  Spoke with daughter at length yesterday and she's agreeable to tracheostomy to help get her off vent.  She may do very well in an LTACH for trach wean. ?Volume overloaded state of heart- improved ?Acute liver injury- question congestive hepatopathy, improved ?Hx anxiety/depression- complicating sedation weans ? ?- Tracheostomy today, work toward PS ?- Wean off sedatives as able today ?- Watch I/O, diuretics PRN ?- LTACH consult ?- Daughter updated by phone, she would like updates daily if able from [facility] team ? ?Best Practice (right click and "Reselect all SmartList Selections" daily)  ? ?Diet/type: tubefeeds ?DVT prophylaxis: LMWH ?GI prophylaxis: PPI ?Lines: N/A ?Foley:  N/A ?Code Status:  full code ?Last date of multidisciplinary goals of care discussion: daughter updated 4/4 ? ?34 min cc time ?Erskine Emery MD PCCM ? ? ? ? ?

## 2021-10-21 NOTE — TOC Progression Note (Signed)
Transition of Care (TOC) - Progression Note  ? ? ?Patient Details  ?Name: Gina Petty ?MRN: [id number] ?Date of Birth: [birth year] ? ?Transition of Care (TOC) CM/SW Contact  ?Tawanna Cooler, RN ?Phone Number: ?10/21/2021, 10:52 AM ? ?Clinical Narrative:    ? ?Received a consult for possible Ltach.  ?Reached out to the reps at [facility] and at [facility] so they can review patient for Ltach appropriateness.   ? ?Called daughter Gina Petty and discussed Ltach and next steps.  Gina Petty does not want to make any choices without speaking to her brother, [name], first.  Also states she would like patient to be less sedated and more aware so patient can also be involved in this choice.   ? ?

## 2021-10-21 NOTE — Progress Notes (Signed)
PT Cancellation Note ? ?Patient Details ?Name: Gina Petty ?MRN: [id number] ?DOB: [birth year] ? ? ?Cancelled Treatment:    Reason Eval/Treat Not Completed: Medical issues which prohibited therapy, on vent. Will follow. ?Tresa Endo PT ?Acute Rehabilitation Services ?Pager [phone] ?Office [phone] ? ? ? ?[name] ?10/21/2021, 7:18 AM ?

## 2021-10-21 NOTE — Procedures (Signed)
Percutaneous Tracheostomy Procedure Note ? ? ?Gina Petty  ?[id number]  ?12/27/1945 ? ?Date:10/21/21  ?Time:10:19 AM  ? ?Provider Performing:Zandon Talton C Tamala Julian ? ?Procedure: Percutaneous Tracheostomy with Bronchoscopic Guidance (40347) ? ?Indication(s) ?Persistent resp failure ? ?Consent ?Risks of the procedure as well as the alternatives and risks of each were explained to the patient and/or caregiver.  Consent for the procedure was obtained. ? ?Anesthesia ?Etomidate, Versed, Fentanyl, Vecuronium ? ? ?Time Out ?Verified patient identification, verified procedure, site/side was marked, verified correct patient position, special equipment/implants available, medications/allergies/relevant history reviewed, required imaging and test results available. ? ? ?Sterile Technique ?Maximal sterile technique including sterile barrier drape, hand hygiene, sterile gown, sterile gloves, mask, hair covering. ? ? ? ?Procedure Description ?Appropriate anatomy identified by palpation.  Patient's neck prepped and draped in sterile fashion.  1% lidocaine with epinephrine was used to anesthetize skin overlying neck.  1.5cm incision made and blunt dissection performed until tracheal rings could be easily palpated.   Then a size 6-0 Shiley tracheostomy was placed under bronchoscopic visualization using usual Seldinger technique and serial dilation.   Bronchoscope confirmed placement above the carina.  Tracheostomy was sutured in place with adhesive pad to protect skin under pressure.    Patient connected to ventilator. ? ? ?Complications/Tolerance ?None; patient tolerated the procedure well. ?Chest X-ray is ordered to confirm no post-procedural complication. ? ? ?EBL ?Minimal ? ? ?Specimen(s) ?None  ? ?

## 2021-10-21 NOTE — Procedures (Addendum)
Diagnostic Bronchoscopy ? ?Gina Petty  ?[id number]  ?March 21, 1946 ? ?Date:10/21/21  ?Time:10:02 AM  ? ?Provider Performing:Reghan Thul E Oluwademilade Mckiver  ? ?Procedure: Diagnostic Bronchoscopy (33825) ?Therapeutic aspiration, subsequent (05397) ? ?Indication(s) ?Assist with direct visualization of tracheostomy placement ? ?Consent ?Risks of the procedure as well as the alternatives and risks of each were explained to the patient and/or caregiver.  Consent for the procedure was obtained. ? ? ?Anesthesia ?See separate tracheostomy note ? ? ?Time Out ?Verified patient identification, verified procedure, site/side was marked, verified correct patient position, special equipment/implants available, medications/allergies/relevant history reviewed, required imaging and test results available. ? ? ?Sterile Technique ?Usual hand hygiene, masks, gowns, and gloves were used ? ? ?Procedure Description ?Bronchoscope advanced through endotracheal tube and into airway.  After suctioning out tracheal secretions, bronchoscope used to provide direct visualization of tracheostomy placement. ?After tracheostomy sutured, therapeutic bronchoscopy performed with aspiration of thick pulmonary secretions from bilateral lobes, significant secretion burden noted from RML.  Upper lobes with blood tinged secretions, otherwise yellow/tan tinged.  ? ? ?Complications/Tolerance ?None; patient tolerated the procedure well. ? ?EBL ?None ? ?Specimen(s) ?None  ? ? ?Gina Gum MSN, AGACNP-BC ?[facility] ?Amion for pager ?10/21/2021, 10:02 AM ? ?

## 2021-10-22 ENCOUNTER — Inpatient Hospital Stay (HOSPITAL_COMMUNITY): Payer: Medicare Other

## 2021-10-22 DIAGNOSIS — J9621 Acute and chronic respiratory failure with hypoxia: Secondary | ICD-10-CM

## 2021-10-22 LAB — HEPATIC FUNCTION PANEL
ALT: 91 U/L — ABNORMAL HIGH (ref 0–44)
AST: 56 U/L — ABNORMAL HIGH (ref 15–41)
Albumin: 2.5 g/dL — ABNORMAL LOW (ref 3.5–5.0)
Alkaline Phosphatase: 65 U/L (ref 38–126)
Bilirubin, Direct: 0.2 mg/dL (ref 0.0–0.2)
Indirect Bilirubin: 0.6 mg/dL (ref 0.3–0.9)
Total Bilirubin: 0.8 mg/dL (ref 0.3–1.2)
Total Protein: 5.8 g/dL — ABNORMAL LOW (ref 6.5–8.1)

## 2021-10-22 LAB — CBC
HCT: 34.1 % — ABNORMAL LOW (ref 36.0–46.0)
Hemoglobin: 11 g/dL — ABNORMAL LOW (ref 12.0–15.0)
MCH: 33.1 pg (ref 26.0–34.0)
MCHC: 32.3 g/dL (ref 30.0–36.0)
MCV: 102.7 fL — ABNORMAL HIGH (ref 80.0–100.0)
Platelets: 269 10*3/uL (ref 150–400)
RBC: 3.32 MIL/uL — ABNORMAL LOW (ref 3.87–5.11)
RDW: 13 % (ref 11.5–15.5)
WBC: 8.9 10*3/uL (ref 4.0–10.5)
nRBC: 0 % (ref 0.0–0.2)

## 2021-10-22 LAB — GLUCOSE, CAPILLARY
Glucose-Capillary: 108 mg/dL — ABNORMAL HIGH (ref 70–99)
Glucose-Capillary: 110 mg/dL — ABNORMAL HIGH (ref 70–99)
Glucose-Capillary: 130 mg/dL — ABNORMAL HIGH (ref 70–99)
Glucose-Capillary: 132 mg/dL — ABNORMAL HIGH (ref 70–99)
Glucose-Capillary: 166 mg/dL — ABNORMAL HIGH (ref 70–99)
Glucose-Capillary: 98 mg/dL (ref 70–99)

## 2021-10-22 LAB — CULTURE, RESPIRATORY W GRAM STAIN
Culture: NORMAL
Gram Stain: NONE SEEN

## 2021-10-22 LAB — PHOSPHORUS: Phosphorus: 3.8 mg/dL (ref 2.5–4.6)

## 2021-10-22 LAB — BASIC METABOLIC PANEL
Anion gap: 7 (ref 5–15)
BUN: 35 mg/dL — ABNORMAL HIGH (ref 8–23)
CO2: 34 mmol/L — ABNORMAL HIGH (ref 22–32)
Calcium: 8.4 mg/dL — ABNORMAL LOW (ref 8.9–10.3)
Chloride: 97 mmol/L — ABNORMAL LOW (ref 98–111)
Creatinine, Ser: 0.62 mg/dL (ref 0.44–1.00)
GFR, Estimated: >60 mL/min (ref >60)
Glucose, Bld: 119 mg/dL — ABNORMAL HIGH (ref 70–99)
Potassium: 4.3 mmol/L (ref 3.5–5.1)
Sodium: 138 mmol/L (ref 135–145)

## 2021-10-22 LAB — MAGNESIUM: Magnesium: 2.3 mg/dL (ref 1.7–2.4)

## 2021-10-22 MED ORDER — OSMOLITE 1.2 CAL PO LIQD
1000.0000 mL | ORAL | Status: DC
  Administered 2021-10-23 – 2021-10-25 (×3): 1000 mL

## 2021-10-22 MED ORDER — HALOPERIDOL LACTATE 5 MG/ML IJ SOLN
5.0000 mg | Freq: Four times a day (QID) | INTRAMUSCULAR | Status: DC | PRN
  Administered 2021-10-22 – 2021-10-23 (×3): 5 mg via INTRAVENOUS
  Filled 2021-10-22 (×3): qty 1

## 2021-10-22 MED ORDER — PROSOURCE TF PO LIQD
90.0000 mL | Freq: Two times a day (BID) | ORAL | Status: DC
Start: 2021-10-22 — End: 2021-10-25
  Administered 2021-10-22 – 2021-10-24 (×5): 90 mL
  Filled 2021-10-22 (×5): qty 90

## 2021-10-22 NOTE — Progress Notes (Signed)
PT Cancellation Note ? ?Patient Details ?Name: Gina Petty ?MRN: [id number] ?DOB: [birth year] ? ? ?Cancelled Treatment:    Reason Eval/Treat Not Completed: Medical issues which prohibited therapy, on ventilator, sedated. PT will check back 4/10. ?Tresa Endo PT ?Acute Rehabilitation Services ?Pager [phone] ?Office [phone] ? ? ? ?[name] ?10/22/2021, 6:57 AM ?

## 2021-10-22 NOTE — Progress Notes (Signed)
Nutrition Follow-up ? ?DOCUMENTATION CODES:  ? ?Not applicable ? ?INTERVENTION:  ?- will adjust TF regimen: Osmolite 1.2 @ 50 ml/hr with 90 ml Prosource TF BID. ?- this regimen will provide 1600 kcal, 111 grams protein, and 984 ml free water. ? ? ?NUTRITION DIAGNOSIS:  ? ?Inadequate oral intake related to inability to eat as evidenced by NPO status. -ongoing ? ?GOAL:  ? ?Patient will meet greater than or equal to 90% of their needs -met with TF regimen ? ?MONITOR:  ? ?Vent status, TF tolerance, Labs, Weight trends, I & O's ? ?ASSESSMENT:  ? ?76 year old female with medical history of HTN, PE, depression, and arthritis. She presented to the ED from home with 1 week hx of progressive shortness of breath with dyspnea on exertion, and worsening cough. She also had 72 hours of fever and diarrhea and reported feeling nauseated in the ED but no emesis PTA or since admission. In the ED she required BiPAP and was admitted for acute respiratory failure and transferred to the ICU where she was intubated on 3/28 afternoon. ? ?Significant Events: ?3/27- admission ?3/28- intubation; OGT placement ?3/29- initial RD assessment ?4/4- extubation; OGT removal; re-intubation; OGT replacement ?4/5- extubation ?4/6- OGT removal; tracheostomy; small bore NGT placement ? ? ?Patient discussed in rounds this AM. Patient is intubated via trach which was placed on 4/6. Small bore NGT in L nare (gastric per abdominal x-ray on 4/6). ? ?She is receiving Osmolite 1.2  at goal rate of 55 ml/hr with 45 ml Prosource TF TID and 200 ml free water every 4 hours. This regimen provides 1704 kcal (105% kcal need), 106 grams protein, and 2282 ml free water.  ? ?Weight has been fluctuating but fairly stable throughout hospitalization. Mild pitting edema to all extremities documented in the edema section of flow sheet. ? ? ?Patient is currently intubated on ventilator support ?MV: 10.9 L/min ?Temp (24hrs), Avg:97.8 ?F (36.6 ?C), Min:96.9 ?F (36.1 ?C),  Max:98.9 ?F (37.2 ?C) ?Propofol: none ?BP: 103/44 and MAP: 64 ? ?Labs reviewed; CBGs: 110, 98, 132 mg/dl, Cl: 97 mmol/l, BUN: 35 mg/dl, Ca: 8.4 mg/dl. ? ?Medications reviewed; 100 mg colace BID, sliding scale novolog, 40 mg solu-medrol/day (4/3-4/9), 40 mg protonix per tube/day, 17 g miralax/day. ? ?Drip; precedex @ 0.6 mcg/kg/hr. ? ? ? ?Diet Order:   ?Diet Order   ? ?       ?  Diet NPO time specified  Diet effective now       ?  ? ?  ?  ? ?  ? ? ?EDUCATION NEEDS:  ? ?No education needs have been identified at this time ? ?Skin:  Skin Assessment: Skin Integrity Issues: ?Skin Integrity Issues:: Stage I, Stage II ?Stage I: bilateral coccyx ?Stage II: L back (newly documented on 4/5) ? ?Last BM:  4/7 (type 7 x2) ? ?Height:  ? ?Ht Readings from Last 1 Encounters:  ?10/13/21 _0  (1.676 m)  ? ? ?Weight:  ? ?Wt Readings from Last 1 Encounters:  ?10/22/21 87.8 kg  ? ? ? ?BMI:  Body mass index is 31.24 kg/m?. ? ?Estimated Nutritional Needs:  ?Kcal:  1628 kcal ?Protein:  103-128 grams ?Fluid:  >/= 2 L/day ? ? ? ? ?Jarome Matin, MS, RD, LDN ?Registered Dietitian II ?Inpatient Clinical Nutrition ?RD pager # and on-call/weekend pager # available in [email or web address]  ? ?

## 2021-10-22 NOTE — Progress Notes (Signed)
SLP Cancellation Note ? ?Patient Details ?Name: Gina Petty ?MRN: [id number] ?DOB: 1946-06-15 ? ? ?Cancelled treatment:       Reason Eval/Treat Not Completed: Patient not medically ready (on vent, sedated). Will continue to follow for readiness.  ? ? ? ?Osie Bond., M.A. CCC-SLP ?Acute Rehabilitation Services ?Office [phone] ? ?Secure chat preferred ? ?10/22/2021, 7:40 AM ?

## 2021-10-22 NOTE — Progress Notes (Signed)
? ?NAME:  Gina Petty, MRN:  [id number], DOB:  1945/12/08, LOS: 62 ?ADMISSION DATE:  10/11/2021, CONSULTATION DATE:  3/27 ?REFERRING MD:  Kommor , CHIEF COMPLAINT:  Dyspnea  ? ?History of Present Illness:  ?76 y/o female with an extensive smoking history admitted on 3/27 severe acute respiratory failure with hypoxemia due to severe community acquired pneumonia. ? ?Pertinent  Medical History  ?Hyperlipidemia ?Hypertension ?Depression ?Cigarette smoker> 35 pack year ? ?Significant Hospital Events: ?Including procedures, antibiotic start and stop dates in addition to other pertinent events   ?3/27 - Admitted via ED from home for SOB, dyspnea. CXR with multifocal PNA (R > L). Low-dose NE to maintain MAP goals. Placed on BiPAP. ?3/28 -  tachypnea (FiO2 45%), tachypneic to 30s. Off of NE.> intubated ? ?Micro ?3/27 sars cov 2/flu > neg ?3/27 resp viral panel >  ?3/27 blood > ng ?3/27 urine > multiple species ?3/28 BAL > ng ? ?Antibiotics ?3/27 cefepime >  ?3/27 azithro >  ? ?3/31 increased agitation when sedation decreased ? ?4/6 - No events. Sedated on vent. MRI results reviewed. TRACH by Erskine Emery ? ?Interim History / Subjective:  ? ? ?10/22/21 - delirim +. RN working on lowering dioprivan and increased precedex.  NNot on pressors. Afebrile.  ? ?Objective   ?Blood pressure (!) 118/53, pulse 82, temperature 98.9 ?F (37.2 ?C), temperature source Axillary, resp. rate (!) 24, height _0  (1.676 m), weight 87.8 kg, SpO2 92 %. ?   ?Vent Mode: PRVC ?FiO2 (%):  [30 %-40 %] 30 % ?Set Rate:  [18 bmp] 18 bmp ?Vt Set:  [470 mL] 470 mL ?PEEP:  [5 cmH20] 5 cmH20 ?Plateau Pressure:  [14 cmH20-16 cmH20] 15 cmH20  ? ?Intake/Output Summary (Last 24 hours) at 10/22/2021 1108 ?Last data filed at 10/22/2021 8841 ?Gross per 24 hour  ?Intake 2108.86 ml  ?Output 1625 ml  ?Net 483.86 ml  ? ?Filed Weights  ? 10/20/21 0428 10/21/21 0453 10/22/21 0500  ?Weight: 89.5 kg 88.5 kg 87.8 kg  ? ? ?Examination: ?General Appearance:  Looks criticall ill  ?Head:   Normocephalic, without obvious abnormality, atraumatic ?Eyes:  PERRL - yes, conjunctiva/corneas - mudd     ?Ears:  Normal external ear canals, both ears ?Nose:  G tube - uyes ?Throat:  ETT TUBE - no , OG tube - no. TRACH + ?Neck:  Supple,  No enlargement/tenderness/nodules ?Lungs: Clear to auscultation bilaterally, Ventilator   Synchrony - yes ?Heart:  S1 and S2 normal, no murmur, CVP - no.  Pressors - no ?Abdomen:  Soft, no masses, no organomegaly ?Genitalia / Rectal:  Not done ?Extremities:  Extremities- intact ?Skin:  ntact in exposed areas . Sacral area - not examined ?Neurologic:  Sedation - diprivan and precedx -> RASS - -3 . Moves all 4s - yes. CAM-ICU - x . Orientation - not ? ? ? ? ?Resolved Hospital Problem list   ?AKI ? ?Assessment & Plan:  ?Acute hypoxemic respiratory failure- due to bilateral CAP on top of severe baseline emphysema.  Completed course of cerfepime.  Lingering issue may have been volume overload.  Attempted extubation 4/5, unable to handle secretions and delirious but moving all 4 ext.  MRI head benign.  We are about a week in and I think with time and sedation lightening she will do much better.  S/p trach 10/21/21.  She may do very well in an LTACH for trach wean. ? ?10/22/2021  - on TRach. Failed wean. Delrium and issue ? ?Plan ?PSV  as tolerated ?PRVC otherwise ?IV steroids ?BD ? ?Hx anxiety/depression- complicating sedation weans ?Acute metaboloic encephalopathy ? ? ?10/22/2021 - obtunded and intermittently agitated delirium + ? ?Plan ? -aim to dc diprivan and increase precedex ?- continue serroqeul ? - Fent and oxy prn ? ? ? ?Volume overloaded state of heart- improved ?Acute liver injury- question congestive hepatopathy, improve ? ?Best Practice (right click and "Reselect all SmartList Selections" daily)  ? ?Diet/type: tubefeeds ?DVT prophylaxis: LMWH ?GI prophylaxis: PPI ?Lines: N/A ?Foley:  N/A ?Code Status:  full code ?Last date of multidisciplinary goals of care discussion: daughter  updated 4/4, 10/21/21 . On 10/22/21 - called daughter-> LMTCB ? ? ?Needs LTAC ? ? ?ATTESTATION & SIGNATURE  ? ?The patient BRAELEIGH Petty is critically ill with multiple organ systems failure and requires high complexity decision making for assessment and support, frequent evaluation and titration of therapies, application of advanced monitoring technologies and extensive interpretation of multiple databases.  ? ?Critical Care Time devoted to patient care services described in this note is  31  Minutes. This time reflects time of care of this signee Dr Brand Males. This critical care time does not reflect procedure time, or teaching time or supervisory time of PA/NP/Med student/Med Resident etc but could involve care discussion time  ? ? ? ?Dr. Brand Males, M.D., F.C.C.P ?Pulmonary and Critical Care Medicine ?Staff Physician ?[facility] ?[facility] ?Pager: [phone], If no answer or between  15:00h - 7:00h: call [phone] ? ?10/22/2021 ?11:08 AM ? ? ? ?LABS  ? ? ?PULMONARY ?Recent Labs  ?Lab 10/20/21 ?1122  ?PHART 7.53*  ?PCO2ART 55*  ?PO2ART 75*  ?HCO3 46.0*  ?O2SAT 99.3  ? ? ?CBC ?Recent Labs  ?Lab 10/20/21 ?0249 10/21/21 ?0233 10/22/21 ?0335  ?HGB 11.4* 12.7 11.0*  ?HCT 35.9* 41.4 34.1*  ?WBC 12.8* 9.5 8.9  ?PLT 329 269 269  ? ? ?COAGULATION ?No results for input(s): INR in the last 168 hours. ? ?CARDIAC ? No results for input(s): TROPONINI in the last 168 hours. ?No results for input(s): PROBNP in the last 168 hours. ? ? ?CHEMISTRY ?Recent Labs  ?Lab 10/18/21 ?0243 10/19/21 ?9024 10/20/21 ?0973 10/21/21 ?5329 10/22/21 ?0335  ?NA 139 139 139 140 138  ?K 4.7 3.9 4.5 4.1 4.3  ?CL 106 98 97* 99 97*  ?CO2 28 33* 35* 33* 34*  ?GLUCOSE 109* 136* 122* 121* 119*  ?BUN 40* 38* 37* 39* 35*  ?CREATININE 0.53 0.70 0.60 0.63 0.62  ?CALCIUM 8.0* 8.6* 8.4* 8.7* 8.4*  ?MG 2.3 2.2 2.2 2.4 2.3  ?PHOS 3.7 4.2 3.2 3.9 3.8  ? ?Estimated Creatinine Clearance: 69.9 mL/min (by C-G formula based  on SCr of 0.62 mg/dL). ? ? ?LIVER ?Recent Labs  ?Lab 10/17/21 ?0301 10/19/21 ?9242 10/20/21 ?6834 10/21/21 ?1962 10/22/21 ?0335  ?AST 44* 28 36 34 56*  ?ALT 146* 80* 67* 67* 91*  ?ALKPHOS 22 97 98 92 11  ?BILITOT 0.9 0.7 1.0 0.7 0.8  ?PROT 5.0* 5.8* 5.8* 6.2* 5.8*  ?ALBUMIN 1.8* 2.9* 2.7* 2.8* 2.5*  ? ? ? ?INFECTIOUS ?No results for input(s): LATICACIDVEN, PROCALCITON in the last 168 hours. ? ? ?ENDOCRINE ?CBG (last 3)  ?Recent Labs  ?  10/21/21 ?2319 10/22/21 ?0330 10/22/21 ?0757  ?GLUCAP 107* 110* 98  ? ? ? ? ? ? ? ?IMAGING x48h  - image(s) personally visualized  -   highlighted in bold ?DG Abd 1 View ? ?Result Date: 10/21/2021 ?CLINICAL  DATA:  Feeding tube placement EXAM: ABDOMEN - 1 VIEW COMPARISON:  10/20/2021 FINDINGS: Feeding tube passes through the stomach with the tip in the gastric antrum. Normal bowel gas pattern.  Bibasilar airspace disease. IMPRESSION: Feeding tube tip gastric antrum. Electronically Signed   By: Franchot Gallo M.D.   On: 10/21/2021 12:09  ? ?MR BRAIN WO CONTRAST ? ?Result Date: 10/20/2021 ?CLINICAL DATA:  Anoxic brain damage, patient unresponsive EXAM: MRI HEAD WITHOUT CONTRAST TECHNIQUE: Multiplanar, multiecho pulse sequences of the brain and surrounding structures were obtained without intravenous contrast. COMPARISON:  06/12/2017 FINDINGS: Brain: No restricted diffusion to suggest acute or subacute infarct; no evidence of anoxic brain injury. No acute hemorrhage, mass, mass effect, or midline shift. No hydrocephalus or extra-axial collection. Scattered T2 hyperintense signal in the periventricular white matter, likely the sequela of mild chronic small vessel ischemic disease. Vascular: Normal flow voids. Skull and upper cervical spine: Normal marrow signal. Sinuses/Orbits: No acute finding. Status post bilateral lens replacements. Other: Fluid in the bilateral mastoid air cells. IMPRESSION: No acute intracranial process. No restricted diffusion or increased T2 signal to suggest anoxic  brain injury. Electronically Signed   By: Merilyn Baba M.D.   On: 10/20/2021 15:07  ? ?DG Chest Port 1 View ? ?Result Date: 10/21/2021 ?CLINICAL DATA:  Status post tracheostomy. EXAM: PORTABLE CHEST 1 VIEW

## 2021-10-22 NOTE — Progress Notes (Signed)
eLink Physician-Brief Progress Note ?Patient Name: Gina Petty ?DOB: 07/15/1946 ?MRN: [id number] ? ? ?Date of Service ? 10/22/2021  ?HPI/Events of Note ? Normal Na >72 hours  ?eICU Interventions ? DC [facility]  ? ? ? ?Intervention Category ?Minor Interventions: Electrolytes abnormality - evaluation and management ? ?Iisha Soyars Rodman Pickle ?10/22/2021, 8:25 PM ?

## 2021-10-22 NOTE — TOC Progression Note (Addendum)
Transition of Care (TOC) - Progression Note  ? ? ?Patient Details  ?Name: Gina Petty ?MRN: [id number] ?Date of Birth: [birth year] ? ?Transition of Care (TOC) CM/SW Contact  ?Beckey Polkowski, Marjie Skiff, RN ?Phone Number: ?10/22/2021, 12:41 PM ? ?Clinical Narrative:    ?Called daughter [name] to follow up on LTACH. Both LTACHs have offered a bed. [name] states that she can not make a choice yet as she has medical questions that need to be answered first. She also stated that she wants to know if the pt is alert enough to help with the decision. MD contacted to alert of conversation. TOC will continue to follow. ? ?Addendum 33: Spoke with daughter [name] again and she has [facility]. [facility] liaison alerted. There is no bed at [facility] at this time. TOC will continue to follow. ? ?Barriers to Discharge: Continued Medical Work up ? ? ?  ?

## 2021-10-23 DIAGNOSIS — J9621 Acute and chronic respiratory failure with hypoxia: Secondary | ICD-10-CM | POA: Diagnosis not present

## 2021-10-23 LAB — HEPATIC FUNCTION PANEL
ALT: 189 U/L — ABNORMAL HIGH (ref 0–44)
AST: 133 U/L — ABNORMAL HIGH (ref 15–41)
Albumin: 2.4 g/dL — ABNORMAL LOW (ref 3.5–5.0)
Alkaline Phosphatase: 77 U/L (ref 38–126)
Bilirubin, Direct: 0.3 mg/dL — ABNORMAL HIGH (ref 0.0–0.2)
Indirect Bilirubin: 0.3 mg/dL (ref 0.3–0.9)
Total Bilirubin: 0.6 mg/dL (ref 0.3–1.2)
Total Protein: 5.7 g/dL — ABNORMAL LOW (ref 6.5–8.1)

## 2021-10-23 LAB — GLUCOSE, CAPILLARY
Glucose-Capillary: 110 mg/dL — ABNORMAL HIGH (ref 70–99)
Glucose-Capillary: 115 mg/dL — ABNORMAL HIGH (ref 70–99)
Glucose-Capillary: 121 mg/dL — ABNORMAL HIGH (ref 70–99)
Glucose-Capillary: 177 mg/dL — ABNORMAL HIGH (ref 70–99)
Glucose-Capillary: 163 mg/dL — ABNORMAL HIGH (ref 70–99)

## 2021-10-23 LAB — CBC
HCT: 33.4 % — ABNORMAL LOW (ref 36.0–46.0)
Hemoglobin: 10.6 g/dL — ABNORMAL LOW (ref 12.0–15.0)
MCH: 32.7 pg (ref 26.0–34.0)
MCHC: 31.7 g/dL (ref 30.0–36.0)
MCV: 103.1 fL — ABNORMAL HIGH (ref 80.0–100.0)
Platelets: 286 10*3/uL (ref 150–400)
RBC: 3.24 MIL/uL — ABNORMAL LOW (ref 3.87–5.11)
RDW: 13.2 % (ref 11.5–15.5)
WBC: 9.1 10*3/uL (ref 4.0–10.5)
nRBC: 0 % (ref 0.0–0.2)

## 2021-10-23 LAB — BASIC METABOLIC PANEL
Anion gap: 4 — ABNORMAL LOW (ref 5–15)
BUN: 26 mg/dL — ABNORMAL HIGH (ref 8–23)
CO2: 31 mmol/L (ref 22–32)
Calcium: 8.2 mg/dL — ABNORMAL LOW (ref 8.9–10.3)
Chloride: 101 mmol/L (ref 98–111)
Creatinine, Ser: 0.56 mg/dL (ref 0.44–1.00)
GFR, Estimated: >60 mL/min (ref >60)
Glucose, Bld: 108 mg/dL — ABNORMAL HIGH (ref 70–99)
Potassium: 3.9 mmol/L (ref 3.5–5.1)
Sodium: 136 mmol/L (ref 135–145)

## 2021-10-23 LAB — STREP PNEUMONIAE URINARY ANTIGEN: Strep Pneumo Urinary Antigen: POSITIVE — AB

## 2021-10-23 LAB — PHOSPHORUS: Phosphorus: 4 mg/dL (ref 2.5–4.6)

## 2021-10-23 LAB — MAGNESIUM: Magnesium: 2.1 mg/dL (ref 1.7–2.4)

## 2021-10-23 MED ORDER — FUROSEMIDE 10 MG/ML IJ SOLN
40.0000 mg | Freq: Every day | INTRAMUSCULAR | Status: DC
Start: 2021-10-23 — End: 2021-10-25
  Administered 2021-10-23 – 2021-10-24 (×2): 40 mg via INTRAVENOUS
  Filled 2021-10-23 (×3): qty 4

## 2021-10-23 NOTE — Progress Notes (Signed)
? ?NAME:  Gina Petty, MRN:  [id number], DOB:  1946-05-30, LOS: 12 ?ADMISSION DATE:  10/11/2021, CONSULTATION DATE:  3/27 ?REFERRING MD:  Kommor , CHIEF COMPLAINT:  Dyspnea  ? ?History of Present Illness:  ?76 y/o female with an extensive smoking history admitted on 3/27 severe acute respiratory failure with hypoxemia due to severe community acquired pneumonia. ? ?Pertinent  Medical History  ?Hyperlipidemia ?Hypertension ?Depression ?Cigarette smoker> 35 pack year ? ? has a past medical history of Arthritis, Depression, Hypertension, and PE (pulmonary embolism). ? ? 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 ? ? reports that she quit smoking about 20 years ago. Her smoking use included cigarettes. She has a 25.00 pack-year smoking history. She has never used smokeless tobacco. ? ? ?Significant Hospital Events: ?Including procedures, antibiotic start and stop dates in addition to other pertinent events   ?3/27 - Admitted via ED from home for SOB, dyspnea. CXR with multifocal PNA (R > L). Low-dose NE to maintain MAP goals. Placed on BiPAP. ?3/28 -  tachypnea (FiO2 45%), tachypneic to 30s. Off of NE.> intubated ? ?Micro ?3/27 sars cov 2/flu > neg ?3/27 resp viral panel >  ?3/27 blood > ng ?3/27 urine > multiple species ?3/28 BAL > ng ? ?Antibiotics ?3/27 cefepime >  10/19/21 ?3/27 azithro >  ?3/27 - vanc - 3/28 ? ? ?3/31 increased agitation when sedation decreased ? ?4/3 = ech normal ? ?4/6 - No events. Sedated on vent. MRI results reviewed. TRACH by Erskine Emery ? ?10/22/21 - delirim +. RN working on lowering dioprivan and increased precedex.  NNot on pressors. Afebrile.  ? ?Interim History / Subjective:  ? ?10/23/2021 - free water stopped overnight. Trach . Vent 40%. Off diprivan. On Precedex gtt only.. Afebrile.  Improved mental status. Follwed commands. Doing SBT ? ? ? ?Objective   ?Blood pressure (!) 111/44, pulse 78, temperature 99.4 ?F (37.4 ?C), temperature source Axillary, resp. rate (!) 27, height _0  (1.676 m), weight 87.5 kg, SpO2 95 %. ?   ?Vent Mode: PSV;CPAP ?FiO2 (%):  [30 %-40 %] 40 % ?Set Rate:  [15 bmp-18 bmp] 15 bmp ?Vt Set:  [470 mL] 470 mL ?PEEP:  [5 cmH20] 5 cmH20 ?Pressure Support:  [15 cmH20] 15 cmH20 ?Plateau Pressure:  [12 cmH20-17 cmH20] 17 cmH20  ? ?Intake/Output Summary (Last 24 hours) at 10/23/2021 1012 ?Last data filed at 10/23/2021 0803 ?Gross per 24 hour  ?Intake 1526.78 ml  ?Output 1450 ml  ?Net 76.78 ml  ? ?Filed Weights  ? 10/21/21 0453 10/22/21 0500 10/23/21 0500  ?Weight: 88.5 kg 87.8 kg 87.5 kg  ? ? ?Examination: ?General Appearance:  Looks criticall ill chronc ?Head:  Normocephalic, without obvious abnormality, atraumatic ?Eyes:  PERRL - yes, conjunctiva/corneas - mudd     ?Ears:  Normal external ear canals, both ears ?Nose:  G tube - uyes ?Throat:  ETT TUBE - no , OG tube - no. TRACH + ?Neck:  Supple,  No enlargement/tenderness/nodules ?Lungs: Clear to auscultation bilaterally, Ventilator   Synchrony - yes ?Heart:  S1 and S2 normal, no murmur, CVP - no.  Pressors - no ?Abdomen:  Soft, no masses, no organomegaly ?Genitalia / Rectal:  Not done ?Extremities:  Extremities- intact ?Skin:  ntact in exposed areas . Sacral area - not examined ?Neurologic:  Sedation -  precedx -> RASS - 2 .  Moves all 4s - yes. CAM-ICU - x . Orientation - not ? ? ? ? ?Resolved Hospital Problem list   ?AKI ? ?Assessment & Plan:  ?Acute hypoxemic respiratory failure- due to bilateral CAP on top of severe baseline emphysema.  Completed course of cerfepime.  Lingering issue may have been volume overload.  Attempted extubation 4/5, unable to handle secretions and delirious but moving all 4 ext.  MRI head benign.  We are about a week in and I think with time and sedation lightening she will do much better.  S/p trach  10/21/21.  She may do very well in an LTACH for trach wean. ? ?10/23/2021  - Doing SBT on trach but not ready to liberate. ? ?Plan ?PSV as tolerated ?PRVC otherwise ?IV steroids ?BD ? ?Hx anxiety/depression- complicating sedation weans ?Acute metaboloic encephalopathy ? ? ?10/23/2021 - obtunded and intermittently agitated delirium + but improved. Off diprivan. On precexex ? ?Plan ? -precedex ?- continue serroqeul ? - Fent and oxy scheduled ? ? ? ?Volume overloaded state of heart-  ? ?10/23/21 - +6L since admit (peak +7L on 10/17/21) ? ?Plan ? - lasix x 1 ? ? ?Acute liver injury.Transaminit- question congestive hepatopathy, I ? ?4/8 - wrse after initial improvement ? ?Plan ?Monitor ? ?Anemia of critical illness - onset 10/22/21 ? ?Plan ?- - PRBC for hgb </= 6.9gm%   ? - exceptions are ?  -  if ACS susepcted/confirmed then transfuse for hgb </= 8.0gm%,  or  ?  -  active bleeding with hemodynamic instability, then transfuse regardless of hemoglobin value ?  At at all times try to transfuse 1 unit prbc as possible with exception of active hemorrhage ? ? ?Best Practice (right click and "Reselect all SmartList Selections" daily)  ? ?Diet/type: tubefeeds ?DVT prophylaxis: LMWH ?GI prophylaxis: PPI ?Lines: N/A ?Foley:  N/A ?Code Status:  full code ?Last date of multidisciplinary goals of care discussion: daughter updated 4/4, 10/21/21 . On 10/22/21 - called daughter-> LMTCB. On 10/23/21 - spoke to daughter Gina Petty  [phone] - gave update (on way from [place], [place]). Did explain to her about need for LTAC. She appreciates daily calls ? ? ?Needs LTAC ? ? ? ?[place]  ? ?The patient Gina Petty is critically ill with multiple organ systems failure and requires high complexity decision making for assessment and support, frequent evaluation and titration of therapies, application of advanced monitoring technologies and extensive interpretation of multiple databases.  ? ?Critical Care Time devoted to patient care services  described in this note is  31  Minutes. This time reflects time of care of this signee Dr Brand Males. This critical care time does not reflect procedure time, or teaching time or supervisory time of PA/NP/Med student/Med Resident etc but could involve care discussion time  ? ? ? ?Dr. Brand Males, M.D., F.C.C.P ?Pulmonary and Critical Care Medicine ?Staff Physician ?[facility] ?[facility] ?Pager: [phone], If no answer or between  15:00h - 7:00h: call [phone] ? ?10/23/2021 ?10:58 AM ? ?LABS  ? ? ?PULMONARY ?Recent Labs  ?Lab 10/20/21 ?1122  ?PHART 7.53*  ?PCO2ART 55*  ?PO2ART 75*  ?HCO3 46.0*  ?O2SAT 99.3  ? ? ?CBC ?Recent Labs  ?Lab 10/21/21 ?0233 10/22/21 ?0335 10/23/21 ?0304  ?HGB 12.7 11.0* 10.6*  ?HCT 41.4 34.1* 33.4*  ?WBC 9.5 8.9 9.1  ?PLT 269 269 286  ? ? ?COAGULATION ?No results for input(s): INR  in the last 168 hours. ? ?CARDIAC ? No results for input(s): TROPONINI in the last 168 hours. ?No results for input(s): PROBNP in the last 168 hours. ? ? ?CHEMISTRY ?Recent Labs  ?Lab 10/19/21 ?6659 10/20/21 ?9357 10/21/21 ?0177 10/22/21 ?0335 10/23/21 ?0304  ?NA 139 139 140 138 136  ?K 3.9 4.5 4.1 4.3 3.9  ?CL 98 97* 99 97* 101  ?CO2 33* 35* 33* 34* 31  ?GLUCOSE 136* 122* 121* 119* 108*  ?BUN 38* 37* 39* 35* 26*  ?CREATININE 0.70 0.60 0.63 0.62 0.56  ?CALCIUM 8.6* 8.4* 8.7* 8.4* 8.2*  ?MG 2.2 2.2 2.4 2.3 2.1  ?PHOS 4.2 3.2 3.9 3.8 4.0  ? ?Estimated Creatinine Clearance: 69.8 mL/min (by C-G formula based on SCr of 0.56 mg/dL). ? ? ?LIVER ?Recent Labs  ?Lab 10/19/21 ?9390 10/20/21 ?3009 10/21/21 ?2330 10/22/21 ?0335 10/23/21 ?0304  ?AST 28 36 34 56* 133*  ?ALT 80* 67* 67* 91* 189*  ?ALKPHOS 07 62 26 33 35  ?BILITOT 0.7 1.0 0.7 0.8 0.6  ?PROT 5.8* 5.8* 6.2* 5.8* 5.7*  ?ALBUMIN 2.9* 2.7* 2.8* 2.5* 2.4*  ? ? ? ?INFECTIOUS ?No results for input(s): LATICACIDVEN, PROCALCITON in the last 168 hours. ? ? ?ENDOCRINE ?CBG (last 3)  ?Recent Labs  ?  10/22/21 ?2348  10/23/21 ?0354 10/23/21 ?4562  ?GLUCAP 108* 110* 115*  ? ? ? ? ? ? ? ?IMAGING x48h  - image(s) personally visualized  -   highlighted in bold ?DG Abd 1 View ? ?Result Date: 10/22/2021 ?CLINICAL DATA:  Evaluate locatio

## 2021-10-23 NOTE — Progress Notes (Signed)
Patient appears tired in wean mode. She has decreased volumes on the ventilator with slight apnea periods and a low VE. She was placed back in previous ventilator mode of PRVC 470 rate 18 30% fio2 and 5 peep. The patient is tolerating well at this time. RT will continue to  monitor ?

## 2021-10-24 ENCOUNTER — Inpatient Hospital Stay (HOSPITAL_COMMUNITY): Payer: Medicare Other

## 2021-10-24 DIAGNOSIS — J9621 Acute and chronic respiratory failure with hypoxia: Secondary | ICD-10-CM | POA: Diagnosis not present

## 2021-10-24 LAB — GLUCOSE, CAPILLARY
Glucose-Capillary: 115 mg/dL — ABNORMAL HIGH (ref 70–99)
Glucose-Capillary: 125 mg/dL — ABNORMAL HIGH (ref 70–99)
Glucose-Capillary: 126 mg/dL — ABNORMAL HIGH (ref 70–99)
Glucose-Capillary: 127 mg/dL — ABNORMAL HIGH (ref 70–99)
Glucose-Capillary: 131 mg/dL — ABNORMAL HIGH (ref 70–99)
Glucose-Capillary: 159 mg/dL — ABNORMAL HIGH (ref 70–99)
Glucose-Capillary: 161 mg/dL — ABNORMAL HIGH (ref 70–99)

## 2021-10-24 LAB — COMPREHENSIVE METABOLIC PANEL
ALT: 245 U/L — ABNORMAL HIGH (ref 0–44)
AST: 139 U/L — ABNORMAL HIGH (ref 15–41)
Albumin: 2.6 g/dL — ABNORMAL LOW (ref 3.5–5.0)
Alkaline Phosphatase: 94 U/L (ref 38–126)
Anion gap: 7 (ref 5–15)
BUN: 33 mg/dL — ABNORMAL HIGH (ref 8–23)
CO2: 32 mmol/L (ref 22–32)
Calcium: 8.7 mg/dL — ABNORMAL LOW (ref 8.9–10.3)
Chloride: 101 mmol/L (ref 98–111)
Creatinine, Ser: 0.52 mg/dL (ref 0.44–1.00)
GFR, Estimated: >60 mL/min (ref >60)
Glucose, Bld: 115 mg/dL — ABNORMAL HIGH (ref 70–99)
Potassium: 3.6 mmol/L (ref 3.5–5.1)
Sodium: 140 mmol/L (ref 135–145)
Total Bilirubin: 0.5 mg/dL (ref 0.3–1.2)
Total Protein: 6 g/dL — ABNORMAL LOW (ref 6.5–8.1)

## 2021-10-24 LAB — PROTIME-INR
INR: 1.1 (ref 0.8–1.2)
Prothrombin Time: 14.4 seconds (ref 11.4–15.2)

## 2021-10-24 LAB — LACTIC ACID, PLASMA: Lactic Acid, Venous: 1.3 mmol/L (ref 0.5–1.9)

## 2021-10-24 LAB — CBC
HCT: 32 % — ABNORMAL LOW (ref 36.0–46.0)
Hemoglobin: 10.3 g/dL — ABNORMAL LOW (ref 12.0–15.0)
MCH: 32.9 pg (ref 26.0–34.0)
MCHC: 32.2 g/dL (ref 30.0–36.0)
MCV: 102.2 fL — ABNORMAL HIGH (ref 80.0–100.0)
Platelets: 273 10*3/uL (ref 150–400)
RBC: 3.13 MIL/uL — ABNORMAL LOW (ref 3.87–5.11)
RDW: 13.2 % (ref 11.5–15.5)
WBC: 8.7 10*3/uL (ref 4.0–10.5)
nRBC: 0 % (ref 0.0–0.2)

## 2021-10-24 LAB — PHOSPHORUS: Phosphorus: 4.4 mg/dL (ref 2.5–4.6)

## 2021-10-24 LAB — MAGNESIUM: Magnesium: 2.2 mg/dL (ref 1.7–2.4)

## 2021-10-24 MED ORDER — POTASSIUM CHLORIDE 20 MEQ PO PACK
40.0000 meq | PACK | Freq: Every day | ORAL | Status: DC
  Administered 2021-10-24: 40 meq
  Filled 2021-10-24: qty 2

## 2021-10-24 MED ORDER — ZINC OXIDE 40 % EX OINT
TOPICAL_OINTMENT | Freq: Two times a day (BID) | CUTANEOUS | Status: DC
  Filled 2021-10-24: qty 57

## 2021-10-24 MED ORDER — CLONAZEPAM 0.5 MG PO TABS
0.5000 mg | ORAL_TABLET | Freq: Two times a day (BID) | ORAL | Status: DC
  Administered 2021-10-24 (×2): 0.5 mg
  Filled 2021-10-24 (×2): qty 1

## 2021-10-24 NOTE — Progress Notes (Addendum)
? ?NAME:  Gina Petty, MRN:  [id number], DOB:  09/03/1945, LOS: 29 ?ADMISSION DATE:  10/11/2021, CONSULTATION DATE:  3/27 ?REFERRING MD:  Kommor , CHIEF COMPLAINT:  Dyspnea  ? ?History of Present Illness:  ?76 y/o female with an extensive smoking history admitted on 3/27 severe acute respiratory failure with hypoxemia due to severe community acquired pneumonia. ? ?Pertinent  Medical History  ?Hyperlipidemia ?Hypertension ?Depression ?Cigarette smoker> 35 pack year ? ? has a past medical history of Arthritis, Depression, Hypertension, and PE (pulmonary embolism). ? ? 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 ? ? reports that she quit smoking about 20 years ago. Her smoking use included cigarettes. She has a 25.00 pack-year smoking history. She has never used smokeless tobacco. ? ? ?Significant Hospital Events: ?Including procedures, antibiotic start and stop dates in addition to other pertinent events   ?3/27 - Admitted via ED from home for SOB, dyspnea. CXR with multifocal PNA (R > L). Low-dose NE to maintain MAP goals. Placed on BiPAP. ?3/28 -  tachypnea (FiO2 45%), tachypneic to 30s. Off of NE.> intubated ? ?Micro ?3/27 sars cov 2/flu > neg ?3/27 resp viral panel >  ?3/27 blood > ng ?3/27 urine > multiple species ?3/28 BAL > ng ? ?Antibiotics ?3/27 cefepime >  10/19/21 ?3/27 azithro >  ?3/27 - vanc - 3/28 ? ? ?3/31 increased agitation when sedation decreased ? ?4/3 = ech normal ? ?4/6 - No events. Sedated on vent. MRI results reviewed. TRACH by Erskine Emery ? ?10/22/21 - delirim +. RN working on lowering dioprivan and increased precedex.  NNot on pressors. Afebrile.  ? ?4/8- 10/23/2021 - free water stopped overnight. Trach . Vent 40%. Off diprivan. On Precedex gtt only.. Afebrile. Improved mental status.  Follwed commands. Doing SBT ? ?Interim History / Subjective:  ? ?10/24/21 {- Failed SBt yesterday after few hours.  TMax 100.53F . ASt/ALT worse (5% with seroquel, - startted 10/21/21), precedex started 10/20/21) ? ? ?Objective   ?Blood pressure (!) 94/50, pulse 61, temperature 98.3 ?F (36.8 ?C), temperature source Oral, resp. rate (!) 38, height _0  (1.676 m), weight 87.5 kg, SpO2 96 %. ?   ?Vent Mode: PRVC ?FiO2 (%):  [40 %] 40 % ?Set Rate:  [15 bmp-22 bmp] 18 bmp ?Vt Set:  [470 mL] 470 mL ?PEEP:  [5 cmH20] 5 cmH20 ?Pressure Support:  [15 cmH20] 15 cmH20 ?Plateau Pressure:  [14 cmH20-17 cmH20] 16 cmH20  ? ?Intake/Output Summary (Last 24 hours) at 10/24/2021 0716 ?Last data filed at 10/24/2021 0500 ?Gross per 24 hour  ?Intake 218.9 ml  ?Output 600 ml  ?Net -381.1 ml  ? ?Filed Weights  ? 10/22/21 0500 10/23/21 0500 10/24/21 0434  ?Weight: 87.8 kg 87.5 kg 87.5 kg  ? ? ?Examination: ?General Appearance:  Looks criticall ill chronc ?Head:  Normocephalic, without obvious abnormality, atraumatic ?Eyes:  PERRL - yes, conjunctiva/corneas - mudd     ?Ears:  Normal external ear canals, both ears ?Nose:  G tube - uyes ?Throat:  ETT TUBE - no , OG tube - no. TRACH + ?Neck:  Supple,  No enlargement/tenderness/nodules ?Lungs: Clear to auscultation bilaterally, Ventilator   Synchrony - yes ?Heart:  S1 and S2 normal, no murmur, CVP - no.  Pressors - no ?Abdomen:  Soft, no masses, no organomegaly ?Genitalia / Rectal:  Not done ?Extremities:  Extremities-  intact ?Skin:  ntact in exposed areas . Sacral area - not examined ?Neurologic:  Sedation -  precedx -> RASS - 2 . Moves all 4s - yes. CAM-ICU - x . Orientation - not ? ?Similar to 10/23/21 ? ? ?Resolved Hospital Problem list   ?AKI ? ?Assessment & Plan:  ?Acute hypoxemic respiratory failure- due to bilateral CAP on top of severe baseline emphysema.  Completed course of cerfepime.  Lingering issue may have been volume overload.  Attempted extubation 4/5, unable to handle secretions and  delirious but moving all 4 ext.  MRI head benign.  We are about a week in and I think with time and sedation lightening she will do much better.  S/p trach 10/21/21.  She may do very well in an LTACH for trach wean. ? ?10/24/2021  -  DId SBT few hours ysterday only and then fatigued ? ?Plan ?PSV as tolerated ?PRVC otherwise ?IV steroids ?BD ? ?Hx anxiety/depression- complicating sedation weans ?Acute metaboloic encephalopathy ? ? ?10/24/2021 - intermittent agitated delirium. Better mental status per daughter. Better with precedex ?Plan ? -precedex ?- stop seroquel (incrased AST/ALT) ? - Fent and oxy scheduled ?- continue klonopin - reduce dose 10/24/21 (also has increased LFT) ? ? ?Fever - low grade 10/24/21 ? ?Plan ? - get PCT ?- Trach aspirate ?- Abx  hx ? - Cefepime 3/27 - 4/4 ? - vanc 3/27 - 3/28 ? - Azithro 3/28 - 4/1 ?  - Flagyl 3/27 - 3/27 ? ? ? ? ? ?Volume overloaded state of heart-  ? ?10/23/21 - +5.6: since admit and improved (peak +7L on 10/17/21) ? ?Plan ? - lasix 50m daily since 10/23/21 ? ? ?Acute liver injury.Transaminit- question congestive hepatopathy, I ? ?4/9 - resolved and now recurrence since 10/22/21 . Correlated with seroquel start 10/21/21 ? ?Plan ?Monitor ?Dc seroquel ?Get RUQ [place]?If still increasing, might have to stop precedex (incidence around 6%-8%) ?Other agents to consider ?  - klonopin ? - lovenox ?  - ? ?Anemia of critical illness - onset 10/22/21 ? ?4/9 - no bleeding ? ?Plan ?- - PRBC for hgb </= 6.9gm%   ? - exceptions are ?  -  if ACS susepcted/confirmed then transfuse for hgb </= 8.0gm%,  or  ?  -  active bleeding with hemodynamic instability, then transfuse regardless of hemoglobin value ?  At at all times try to transfuse 1 unit prbc as possible with exception of active hemorrhage ? ? ?Best Practice (right click and "Reselect all SmartList Selections" daily)  ? ?Diet/type: tubefeeds ?DVT prophylaxis: LMWH ?GI prophylaxis: PPI ?Lines: N/A ?Foley:  N/A ?Code Status:  full code ?Last date of  multidisciplinary goals of care discussion: daughter updated 4/4, 10/21/21 .  ? ?10/22/21 - called daughter-> LMTCB.  ? ?10/23/21 - spoke to daughter AMarli Diego [phone]- [place] Did explain to her about need for LTAC. She appreciates daily calls ? ?10/24/21 -  daughte rupdated over phone ? ? ?Needs LTAC ? ? ? ?AD'Lo ? ?The patient NKARLEI WALDOis critically ill with multiple organ systems failure and requires high complexity decision making for assessment and support, frequent evaluation and titration of therapies, application of advanced monitoring technologies and extensive interpretation of multiple databases.  ? ?Critical Care Time devoted to patient care services described in this note is  31  Minutes. This time reflects time of care of this signee Dr MBrand Males This  critical care time does not reflect procedure time, or teaching time or supervisory time of PA/NP/Med student/Med Resident etc but could involve care discussion time  ? ? ? ?Dr. Brand Males, M.D., F.C.C.P ?Pulmonary and Critical Care Medicine ?Medical Director - [facility] ICU ?Staff Physician, [place] ?[place] ?Pager: [phone], If no answer or between  15:00h - 7:00h: call [phone] ? ?10/24/2021 ?7:40 AM ? ? ?LABS  ? ? ?PULMONARY ?Recent Labs  ?Lab 10/20/21 ?1122  ?PHART 7.53*  ?PCO2ART 55*  ?PO2ART 75*  ?HCO3 46.0*  ?O2SAT 99.3  ? ? ?CBC ?Recent Labs  ?Lab 10/22/21 ?0335 10/23/21 ?0304 10/23/21 ?2336  ?HGB 11.0* 10.6* 10.3*  ?HCT 34.1* 33.4* 32.0*  ?WBC 8.9 9.1 8.7  ?PLT 269 286 273  ? ? ?COAGULATION ?Recent Labs  ?Lab 10/24/21 ?0302  ?INR 1.1  ? ? ?CARDIAC ? No results for input(s): TROPONINI in the last 168 hours. ?No results for input(s): PROBNP in the last 168 hours. ? ? ?CHEMISTRY ?Recent Labs  ?Lab 10/20/21 ?0249 10/21/21 ?0233 10/22/21 ?0335 10/23/21 ?0304 10/24/21 ?0302  ?NA 139 140 138 136 140  ?K 4.5 4.1 4.3 3.9 3.6   ?CL 97* 99 97* 101 101  ?CO2 35* 33* 34* 31 32  ?GLUCOSE 122* 121* 119* 108* 115*  ?BUN 37* 39* 35* 26* 33*  ?CREATININE 0.60 0.63 0.62 0.56 0.52  ?CALCIUM 8.4* 8.7* 8.4* 8.2* 8.7*  ?MG 2.2 2.4 2.3 2.1 2.2  ?PHOS 3.2 3.9

## 2021-10-25 ENCOUNTER — Inpatient Hospital Stay
Admission: RE | Admit: 2021-10-25 | Discharge: 2021-12-07 | Disposition: A | Discharge status: Skilled Nursing Facility | Payer: Medicare Other | Source: Ambulatory Visit | Attending: Internal Medicine | Admitting: Internal Medicine

## 2021-10-25 DIAGNOSIS — M1611 Unilateral primary osteoarthritis, right hip: Secondary | ICD-10-CM | POA: Diagnosis not present

## 2021-10-25 DIAGNOSIS — Z4682 Encounter for fitting and adjustment of non-vascular catheter: Secondary | ICD-10-CM | POA: Diagnosis not present

## 2021-10-25 DIAGNOSIS — N179 Acute kidney failure, unspecified: Secondary | ICD-10-CM | POA: Diagnosis not present

## 2021-10-25 DIAGNOSIS — R7989 Other specified abnormal findings of blood chemistry: Secondary | ICD-10-CM | POA: Diagnosis not present

## 2021-10-25 DIAGNOSIS — F17200 Nicotine dependence, unspecified, uncomplicated: Secondary | ICD-10-CM | POA: Diagnosis not present

## 2021-10-25 DIAGNOSIS — D110 Benign neoplasm of parotid gland: Secondary | ICD-10-CM | POA: Diagnosis not present

## 2021-10-25 DIAGNOSIS — J439 Emphysema, unspecified: Secondary | ICD-10-CM | POA: Diagnosis not present

## 2021-10-25 DIAGNOSIS — J9601 Acute respiratory failure with hypoxia: Secondary | ICD-10-CM | POA: Diagnosis not present

## 2021-10-25 DIAGNOSIS — S36119A Unspecified injury of liver, initial encounter: Secondary | ICD-10-CM | POA: Diagnosis not present

## 2021-10-25 DIAGNOSIS — E785 Hyperlipidemia, unspecified: Secondary | ICD-10-CM | POA: Diagnosis not present

## 2021-10-25 DIAGNOSIS — R1310 Dysphagia, unspecified: Secondary | ICD-10-CM | POA: Diagnosis not present

## 2021-10-25 DIAGNOSIS — F3289 Other specified depressive episodes: Secondary | ICD-10-CM | POA: Diagnosis not present

## 2021-10-25 DIAGNOSIS — R600 Localized edema: Secondary | ICD-10-CM | POA: Diagnosis present

## 2021-10-25 DIAGNOSIS — L89896 Pressure-induced deep tissue damage of other site: Secondary | ICD-10-CM | POA: Diagnosis not present

## 2021-10-25 DIAGNOSIS — Z430 Encounter for attention to tracheostomy: Secondary | ICD-10-CM | POA: Diagnosis not present

## 2021-10-25 DIAGNOSIS — F418 Other specified anxiety disorders: Secondary | ICD-10-CM | POA: Diagnosis not present

## 2021-10-25 DIAGNOSIS — M1711 Unilateral primary osteoarthritis, right knee: Secondary | ICD-10-CM | POA: Diagnosis not present

## 2021-10-25 DIAGNOSIS — L8993 Pressure ulcer of unspecified site, stage 3: Secondary | ICD-10-CM | POA: Diagnosis not present

## 2021-10-25 DIAGNOSIS — Z931 Gastrostomy status: Secondary | ICD-10-CM | POA: Diagnosis not present

## 2021-10-25 DIAGNOSIS — Z8701 Personal history of pneumonia (recurrent): Secondary | ICD-10-CM | POA: Diagnosis not present

## 2021-10-25 DIAGNOSIS — Z452 Encounter for adjustment and management of vascular access device: Secondary | ICD-10-CM | POA: Diagnosis not present

## 2021-10-25 DIAGNOSIS — F411 Generalized anxiety disorder: Secondary | ICD-10-CM | POA: Diagnosis not present

## 2021-10-25 DIAGNOSIS — M6281 Muscle weakness (generalized): Secondary | ICD-10-CM | POA: Diagnosis not present

## 2021-10-25 DIAGNOSIS — J984 Other disorders of lung: Secondary | ICD-10-CM | POA: Diagnosis not present

## 2021-10-25 DIAGNOSIS — J029 Acute pharyngitis, unspecified: Secondary | ICD-10-CM | POA: Diagnosis not present

## 2021-10-25 DIAGNOSIS — J392 Other diseases of pharynx: Secondary | ICD-10-CM | POA: Diagnosis not present

## 2021-10-25 DIAGNOSIS — Z930 Tracheostomy status: Secondary | ICD-10-CM | POA: Diagnosis not present

## 2021-10-25 DIAGNOSIS — Z1159 Encounter for screening for other viral diseases: Secondary | ICD-10-CM | POA: Diagnosis not present

## 2021-10-25 DIAGNOSIS — J449 Chronic obstructive pulmonary disease, unspecified: Secondary | ICD-10-CM | POA: Diagnosis not present

## 2021-10-25 DIAGNOSIS — R94138 Abnormal results of other function studies of peripheral nervous system: Secondary | ICD-10-CM | POA: Diagnosis not present

## 2021-10-25 DIAGNOSIS — S36118A Other injury of liver, initial encounter: Secondary | ICD-10-CM | POA: Diagnosis not present

## 2021-10-25 DIAGNOSIS — L89890 Pressure ulcer of other site, unstageable: Secondary | ICD-10-CM | POA: Diagnosis present

## 2021-10-25 DIAGNOSIS — M7989 Other specified soft tissue disorders: Secondary | ICD-10-CM | POA: Diagnosis not present

## 2021-10-25 DIAGNOSIS — R531 Weakness: Secondary | ICD-10-CM | POA: Diagnosis not present

## 2021-10-25 DIAGNOSIS — K1120 Sialoadenitis, unspecified: Secondary | ICD-10-CM | POA: Diagnosis not present

## 2021-10-25 DIAGNOSIS — B952 Enterococcus as the cause of diseases classified elsewhere: Secondary | ICD-10-CM | POA: Diagnosis not present

## 2021-10-25 DIAGNOSIS — Z7902 Long term (current) use of antithrombotics/antiplatelets: Secondary | ICD-10-CM | POA: Diagnosis not present

## 2021-10-25 DIAGNOSIS — Z9911 Dependence on respirator [ventilator] status: Secondary | ICD-10-CM | POA: Diagnosis not present

## 2021-10-25 DIAGNOSIS — J9690 Respiratory failure, unspecified, unspecified whether with hypoxia or hypercapnia: Secondary | ICD-10-CM | POA: Diagnosis not present

## 2021-10-25 DIAGNOSIS — E46 Unspecified protein-calorie malnutrition: Secondary | ICD-10-CM | POA: Diagnosis not present

## 2021-10-25 DIAGNOSIS — F419 Anxiety disorder, unspecified: Secondary | ICD-10-CM | POA: Diagnosis not present

## 2021-10-25 DIAGNOSIS — R609 Edema, unspecified: Secondary | ICD-10-CM | POA: Diagnosis not present

## 2021-10-25 DIAGNOSIS — R279 Unspecified lack of coordination: Secondary | ICD-10-CM | POA: Diagnosis not present

## 2021-10-25 DIAGNOSIS — R1312 Dysphagia, oropharyngeal phase: Secondary | ICD-10-CM | POA: Diagnosis not present

## 2021-10-25 DIAGNOSIS — J9811 Atelectasis: Secondary | ICD-10-CM | POA: Diagnosis not present

## 2021-10-25 DIAGNOSIS — K5730 Diverticulosis of large intestine without perforation or abscess without bleeding: Secondary | ICD-10-CM | POA: Diagnosis not present

## 2021-10-25 DIAGNOSIS — D649 Anemia, unspecified: Secondary | ICD-10-CM | POA: Diagnosis not present

## 2021-10-25 DIAGNOSIS — I10 Essential (primary) hypertension: Secondary | ICD-10-CM | POA: Diagnosis not present

## 2021-10-25 DIAGNOSIS — L89621 Pressure ulcer of left heel, stage 1: Secondary | ICD-10-CM | POA: Diagnosis present

## 2021-10-25 DIAGNOSIS — J189 Pneumonia, unspecified organism: Secondary | ICD-10-CM | POA: Diagnosis present

## 2021-10-25 DIAGNOSIS — N390 Urinary tract infection, site not specified: Secondary | ICD-10-CM | POA: Diagnosis not present

## 2021-10-25 DIAGNOSIS — E870 Hyperosmolality and hypernatremia: Secondary | ICD-10-CM | POA: Diagnosis not present

## 2021-10-25 DIAGNOSIS — Z6831 Body mass index (BMI) 31.0-31.9, adult: Secondary | ICD-10-CM | POA: Diagnosis not present

## 2021-10-25 DIAGNOSIS — G510 Bell's palsy: Secondary | ICD-10-CM | POA: Diagnosis not present

## 2021-10-25 DIAGNOSIS — Z8674 Personal history of sudden cardiac arrest: Secondary | ICD-10-CM | POA: Diagnosis not present

## 2021-10-25 DIAGNOSIS — N183 Chronic kidney disease, stage 3 unspecified: Secondary | ICD-10-CM | POA: Diagnosis not present

## 2021-10-25 DIAGNOSIS — G9341 Metabolic encephalopathy: Secondary | ICD-10-CM | POA: Diagnosis not present

## 2021-10-25 DIAGNOSIS — J9621 Acute and chronic respiratory failure with hypoxia: Secondary | ICD-10-CM | POA: Diagnosis not present

## 2021-10-25 DIAGNOSIS — Z743 Need for continuous supervision: Secondary | ICD-10-CM | POA: Diagnosis not present

## 2021-10-25 DIAGNOSIS — K1121 Acute sialoadenitis: Secondary | ICD-10-CM | POA: Diagnosis not present

## 2021-10-25 DIAGNOSIS — R0602 Shortness of breath: Secondary | ICD-10-CM | POA: Diagnosis not present

## 2021-10-25 DIAGNOSIS — K7200 Acute and subacute hepatic failure without coma: Secondary | ICD-10-CM | POA: Diagnosis not present

## 2021-10-25 DIAGNOSIS — K6389 Other specified diseases of intestine: Secondary | ICD-10-CM | POA: Diagnosis not present

## 2021-10-25 DIAGNOSIS — L89810 Pressure ulcer of head, unstageable: Secondary | ICD-10-CM | POA: Diagnosis present

## 2021-10-25 DIAGNOSIS — R6510 Systemic inflammatory response syndrome (SIRS) of non-infectious origin without acute organ dysfunction: Secondary | ICD-10-CM | POA: Diagnosis not present

## 2021-10-25 DIAGNOSIS — J159 Unspecified bacterial pneumonia: Secondary | ICD-10-CM | POA: Diagnosis not present

## 2021-10-25 LAB — CBC
HCT: 34.6 % — ABNORMAL LOW (ref 36.0–46.0)
Hemoglobin: 11 g/dL — ABNORMAL LOW (ref 12.0–15.0)
MCH: 32.2 pg (ref 26.0–34.0)
MCHC: 31.8 g/dL (ref 30.0–36.0)
MCV: 101.2 fL — ABNORMAL HIGH (ref 80.0–100.0)
Platelets: 279 10*3/uL (ref 150–400)
RBC: 3.42 MIL/uL — ABNORMAL LOW (ref 3.87–5.11)
RDW: 12.8 % (ref 11.5–15.5)
WBC: 7.7 10*3/uL (ref 4.0–10.5)
nRBC: 0 % (ref 0.0–0.2)

## 2021-10-25 LAB — BLOOD GAS, ARTERIAL
Acid-Base Excess: 9.3 mmol/L — ABNORMAL HIGH (ref 0.0–2.0)
Bicarbonate: 33.5 mmol/L — ABNORMAL HIGH (ref 20.0–28.0)
O2 Saturation: 96.2 %
Patient temperature: 36.5
pCO2 arterial: 42 mmHg (ref 32–48)
pH, Arterial: 7.51 — ABNORMAL HIGH (ref 7.35–7.45)
pO2, Arterial: 68 mmHg — ABNORMAL LOW (ref 83–108)

## 2021-10-25 LAB — COMPREHENSIVE METABOLIC PANEL
ALT: 271 U/L — ABNORMAL HIGH (ref 0–44)
AST: 137 U/L — ABNORMAL HIGH (ref 15–41)
Albumin: 2.3 g/dL — ABNORMAL LOW (ref 3.5–5.0)
Alkaline Phosphatase: 93 U/L (ref 38–126)
Anion gap: 7 (ref 5–15)
BUN: 35 mg/dL — ABNORMAL HIGH (ref 8–23)
CO2: 30 mmol/L (ref 22–32)
Calcium: 8.6 mg/dL — ABNORMAL LOW (ref 8.9–10.3)
Chloride: 102 mmol/L (ref 98–111)
Creatinine, Ser: 0.41 mg/dL — ABNORMAL LOW (ref 0.44–1.00)
GFR, Estimated: >60 mL/min (ref >60)
Glucose, Bld: 119 mg/dL — ABNORMAL HIGH (ref 70–99)
Potassium: 3.9 mmol/L (ref 3.5–5.1)
Sodium: 139 mmol/L (ref 135–145)
Total Bilirubin: 0.5 mg/dL (ref 0.3–1.2)
Total Protein: 5.7 g/dL — ABNORMAL LOW (ref 6.5–8.1)

## 2021-10-25 LAB — MAGNESIUM: Magnesium: 2 mg/dL (ref 1.7–2.4)

## 2021-10-25 LAB — PROCALCITONIN: Procalcitonin: <0.1 ng/mL

## 2021-10-25 LAB — LEGIONELLA PNEUMOPHILA SEROGP 1 UR AG: L. pneumophila Serogp 1 Ur Ag: NEGATIVE

## 2021-10-25 LAB — GLUCOSE, CAPILLARY
Glucose-Capillary: 148 mg/dL — ABNORMAL HIGH (ref 70–99)
Glucose-Capillary: 122 mg/dL — ABNORMAL HIGH (ref 70–99)
Glucose-Capillary: 116 mg/dL — ABNORMAL HIGH (ref 70–99)
Glucose-Capillary: 119 mg/dL — ABNORMAL HIGH (ref 70–99)

## 2021-10-25 LAB — PROTIME-INR
INR: 1.1 (ref 0.8–1.2)
Prothrombin Time: 14.5 seconds (ref 11.4–15.2)

## 2021-10-25 LAB — PHOSPHORUS: Phosphorus: 4.1 mg/dL (ref 2.5–4.6)

## 2021-10-25 MED ORDER — INSULIN ASPART 100 UNIT/ML IJ SOLN: 2.0000 [IU] | INTRAMUSCULAR | 11 refills | Status: DC

## 2021-10-25 MED ORDER — LIP MEDEX EX OINT: TOPICAL_OINTMENT | CUTANEOUS | 0 refills | Status: DC | PRN

## 2021-10-25 MED ORDER — POTASSIUM CHLORIDE 20 MEQ PO PACK: 40.0000 meq | PACK | Freq: Every day | ORAL | Status: DC

## 2021-10-25 MED ORDER — PROSOURCE TF PO LIQD
90.0000 mL | Freq: Two times a day (BID) | ORAL | Status: DC
Start: 2021-10-25 — End: 2024-01-12

## 2021-10-25 MED ORDER — ENOXAPARIN SODIUM 40 MG/0.4ML IJ SOSY: 40.0000 mg | PREFILLED_SYRINGE | INTRAMUSCULAR | Status: DC

## 2021-10-25 MED ORDER — ONDANSETRON HCL 4 MG/2ML IJ SOLN: 4.0000 mg | Freq: Four times a day (QID) | INTRAMUSCULAR | 0 refills | Status: DC | PRN

## 2021-10-25 MED ORDER — DOCUSATE SODIUM 100 MG PO CAPS: 100.0000 mg | ORAL_CAPSULE | Freq: Two times a day (BID) | ORAL | 0 refills | Status: DC | PRN

## 2021-10-25 MED ORDER — PANTOPRAZOLE SODIUM 40 MG PO PACK: 40.0000 mg | PACK | Freq: Every day | ORAL | Status: DC

## 2021-10-25 MED ORDER — CLONAZEPAM 0.5 MG PO TABS: 0.5000 mg | ORAL_TABLET | Freq: Two times a day (BID) | ORAL | 0 refills | Status: DC

## 2021-10-25 MED ORDER — FUROSEMIDE 10 MG/ML IJ SOLN
40.0000 mg | Freq: Every day | INTRAMUSCULAR | 0 refills | Status: DC
Start: 2021-10-25 — End: 2024-01-12

## 2021-10-25 MED ORDER — ALBUTEROL SULFATE (2.5 MG/3ML) 0.083% IN NEBU: 2.5000 mg | INHALATION_SOLUTION | RESPIRATORY_TRACT | 12 refills | Status: DC | PRN

## 2021-10-25 MED ORDER — ZINC OXIDE 40 % EX OINT: TOPICAL_OINTMENT | Freq: Two times a day (BID) | CUTANEOUS | 0 refills | Status: DC

## 2021-10-25 MED ORDER — ARFORMOTEROL TARTRATE 15 MCG/2ML IN NEBU
15.0000 ug | INHALATION_SOLUTION | Freq: Two times a day (BID) | RESPIRATORY_TRACT | Status: DC
Start: 2021-10-25 — End: 2024-01-12

## 2021-10-25 MED ORDER — OSMOLITE 1.2 CAL PO LIQD
1000.0000 mL | ORAL | 0 refills | Status: DC
Start: 2021-10-25 — End: 2024-01-12

## 2021-10-25 MED ORDER — DOCUSATE SODIUM 50 MG/5ML PO LIQD: 100.0000 mg | Freq: Two times a day (BID) | ORAL | 0 refills | Status: DC

## 2021-10-25 MED ORDER — FENTANYL CITRATE PF 50 MCG/ML IJ SOSY
25.0000 ug | PREFILLED_SYRINGE | Freq: Once | INTRAMUSCULAR | Status: AC
  Administered 2021-10-25: 25 ug via INTRAVENOUS
  Filled 2021-10-25: qty 1

## 2021-10-25 MED ORDER — BUDESONIDE 0.5 MG/2ML IN SUSP: 0.5000 mg | Freq: Two times a day (BID) | RESPIRATORY_TRACT | 12 refills | Status: DC

## 2021-10-25 MED ORDER — HALOPERIDOL LACTATE 5 MG/ML IJ SOLN: 5.0000 mg | Freq: Four times a day (QID) | INTRAMUSCULAR | Status: DC | PRN

## 2021-10-25 MED ORDER — DEXMEDETOMIDINE HCL IN NACL 400 MCG/100ML IV SOLN: 0.4000 ug/kg/h | INTRAVENOUS | Status: DC

## 2021-10-25 MED ORDER — POLYETHYLENE GLYCOL 3350 17 G PO PACK: 17.0000 g | PACK | Freq: Every day | ORAL | 0 refills | Status: DC

## 2021-10-25 MED ORDER — IPRATROPIUM BROMIDE 0.02 % IN SOLN: 0.5000 mg | RESPIRATORY_TRACT | 12 refills | Status: DC | PRN

## 2021-10-25 MED ORDER — OXYCODONE HCL 5 MG/5ML PO SOLN: 5.0000 mg | Freq: Four times a day (QID) | ORAL | 0 refills | Status: DC

## 2021-10-25 NOTE — Progress Notes (Signed)
Called [facility] to give report on Gina Petty. I gave report to the charge nurse. I did specify that Gina Petty will need a new NG tube placed, they confirmed they can place a NG tube there. Transfer is set up, [facility] scheduled pick up of Gina Petty at 1900.  ?

## 2021-10-25 NOTE — Progress Notes (Signed)
? ?NAME:  Gina Petty, MRN:  [id number], DOB:  Sep 27, 1945, LOS: 68 ?ADMISSION DATE:  10/11/2021, CONSULTATION DATE:  10/11/21 ?REFERRING MD:  Kommor CHIEF COMPLAINT:  Dyspnea  ? ?History of Present Illness:  ?76 y/o female with an extensive smoking history admitted on 3/27 severe acute respiratory failure with hypoxemia due to severe community acquired pneumonia. ? ?Pertinent  Medical History  ?Hyperlipidemia ?Hypertension ?Depression ?Cigarette smoker> 35 pack year ? ?Significant Hospital Events: ?Including procedures, antibiotic start and stop dates in addition to other pertinent events   ?3/27 - Admitted via ED from home for SOB, dyspnea. CXR with multifocal PNA (R > L). Low-dose NE to maintain MAP goals. Placed on BiPAP. ?3/28 -  tachypnea (FiO2 45%), tachypneic to 30s. Off of NE.> intubated ? ?Micro ?3/27 sars cov 2/flu > neg ?3/27 resp viral panel >  ?3/27 blood > ng ?3/27 urine > multiple species ?3/28 BAL > ng ? ?Antibiotics ?3/27 cefepime >  10/19/21 ?3/27 azithro >  ?3/27 - vanc - 3/28 ? ? ?3/31 increased agitation when sedation decreased ? ?4/3 = ech normal ? ?4/6 - No events. Sedated on vent. MRI results reviewed. TRACH by Erskine Emery ? ?10/22/21 - delirim +. RN working on lowering dioprivan and increased precedex.  NNot on pressors. Afebrile.  ? ?4/8- 10/23/2021 - free water stopped overnight. Trach . Vent 40%. Off diprivan. On Precedex gtt only.. Afebrile. Improved mental status. Follwed commands. Doing SBT ? ?Interim History / Subjective:  ? ?No acute events overnight. ?Patient removed cortrak tube this AM ?Patient's daughter at bedside ?Discussing plan for [facility] ? ?Objective   ?Blood pressure (!) 115/52, pulse 62, temperature 97.9 ?F (36.6 ?C), temperature source Oral, resp. rate 20, height _0  (1.676 m), weight 87.5 kg, SpO2 100 %. ?   ?Vent Mode: PSV;CPAP ?FiO2 (%):  [40 %] 40 % ?Set Rate:  [18 bmp] 18 bmp ?Vt Set:  [470 mL] 470 mL ?PEEP:  [5 cmH20] 5 cmH20 ?Pressure Support:  [7 cmH20-8 cmH20] 7  cmH20 ?Plateau Pressure:  [14 cmH20-17 cmH20] 16 cmH20  ? ?Intake/Output Summary (Last 24 hours) at 10/25/2021 0847 ?Last data filed at 10/25/2021 [phone] ?Gross per 24 hour  ?Intake 156.76 ml  ?Output 1400 ml  ?Net -1243.24 ml  ? ?Filed Weights  ? 10/23/21 0500 10/24/21 0434 10/25/21 0500  ?Weight: 87.5 kg 87.5 kg 87.5 kg  ? ?Examination: ?General: chronically ill appearing woman, no acute distress, tired appearing ?HENT: [place]/AT, cortrak inplace, sclera anciteric, trach in place, coughing up blood tinged sputum ?Lungs: diminished breath sounds, no wheezing ?Cardiovascular: rrr, no murmurs ?Abdomen: soft, non-tender, non-distended, BS+ ?Extremities: warm, trace edema ?Neuro: alert, drowsy appearing. Moving all extremities ?GU: n/a ? ?Resolved Hospital Problem list   ? ? ?Assessment & Plan:  ?Acute hypoxemic respiratory failure ?Emphysema ?Community Acquired Pneumonia ?S/p tracheostomy 4/6 ?- Continue vent weaning ?- Continue ICS/LABA nebulizer treatments ?- Completed course of cefepime ? ?Hx anxiety/depression- complicating sedation weans ?Acute metaboloic encephalopathy ?- continue precedex as needed ?- Fent and oxy PRN ?- continue klonipin  ?- consider PRN antipsychotics ?  ?Volume overloaded state of heart-  ? - lasix 43m daily since 10/23/21 ?  ?Acute liver injury.Transaminit- question congestive hepatopathy, I ?4/9 - resolved and now recurrence since 10/22/21 . Correlated with seroquel start 10/21/21 ?- Monitor ?- Dc seroquel ?  ?Anemia of critical illness - onset 10/22/21 ?- monitor ?- transfuse for hemoglobin <7g/dL ?  ?  ?Best Practice (right click and "Reselect all SmartList Selections"  daily)  ?  ?Diet/type: tubefeeds ?DVT prophylaxis: LMWH ?GI prophylaxis: PPI ?Lines: N/A ?Foley:  N/A ?Code Status:  full code ?Last date of multidisciplinary goals of care discussion: daughter updated 4/10, [facility] ? ?Labs   ?CBC: ?Recent Labs  ?Lab 10/21/21 ?0233 10/22/21 ?0335 10/23/21 ?0304 10/23/21 ?2336 10/25/21 ?0259   ?WBC 9.5 8.9 9.1 8.7 7.7  ?HGB 12.7 11.0* 10.6* 10.3* 11.0*  ?HCT 41.4 34.1* 33.4* 32.0* 34.6*  ?MCV 106.2* 102.7* 103.1* 102.2* 101.2*  ?PLT 269 269 286 273 279  ? ? ?Basic Metabolic Panel: ?Recent Labs  ?Lab 10/21/21 ?0233 10/22/21 ?0335 10/23/21 ?0304 10/24/21 ?0302 10/25/21 ?1638  ?NA 140 138 136 140 139  ?K 4.1 4.3 3.9 3.6 3.9  ?CL 99 97* 101 101 102  ?CO2 33* 34* 31 32 30  ?GLUCOSE 121* 119* 108* 115* 119*  ?BUN 39* 35* 26* 33* 35*  ?CREATININE 0.63 0.62 0.56 0.52 0.41*  ?CALCIUM 8.7* 8.4* 8.2* 8.7* 8.6*  ?MG 2.4 2.3 2.1 2.2 2.0  ?PHOS 3.9 3.8 4.0 4.4 4.1  ? ?GFR: ?Estimated Creatinine Clearance: 69.8 mL/min (A) (by C-G formula based on SCr of 0.41 mg/dL (L)). ?Recent Labs  ?Lab 10/22/21 ?0335 10/23/21 ?0304 10/23/21 ?2336 10/24/21 ?0302 10/25/21 ?0259  ?PROCALCITON  --   --   --   --  <0.10  ?WBC 8.9 9.1 8.7  --  7.7  ?LATICACIDVEN  --   --   --  1.3  --   ? ? ?Liver Function Tests: ?Recent Labs  ?Lab 10/21/21 ?0233 10/22/21 ?0335 10/23/21 ?0304 10/24/21 ?0302 10/25/21 ?4536  ?AST 34 56* 133* 139* 137*  ?ALT 67* 91* 189* 245* 271*  ?ALKPHOS 62 65 77 94 93  ?BILITOT 0.7 0.8 0.6 0.5 0.5  ?PROT 6.2* 5.8* 5.7* 6.0* 5.7*  ?ALBUMIN 2.8* 2.5* 2.4* 2.6* 2.3*  ? ?No results for input(s): LIPASE, AMYLASE in the last 168 hours. ?No results for input(s): AMMONIA in the last 168 hours. ? ?ABG ?   ?Component Value Date/Time  ? PHART 7.53 (H) 10/20/2021 1122  ? PCO2ART 55 (H) 10/20/2021 1122  ? PO2ART 75 (L) 10/20/2021 1122  ? HCO3 46.0 (H) 10/20/2021 1122  ? O2SAT 99.3 10/20/2021 1122  ?  ? ?Coagulation Profile: ?Recent Labs  ?Lab 10/24/21 ?0302 10/25/21 ?4680  ?INR 1.1 1.1  ? ? ?Cardiac Enzymes: ?No results for input(s): CKTOTAL, CKMB, CKMBINDEX, TROPONINI in the last 168 hours. ? ?HbA1C: ?No results found for: HGBA1C ? ?CBG: ?Recent Labs  ?Lab 10/24/21 ?1619 10/24/21 ?2045 10/24/21 ?2331 10/25/21 ?3212 10/25/21 ?0801  ?GLUCAP 159* 161* 115* 148* 122*  ? ? ? ?Critical care time: 35 minutes ?  ? ?Freda Jackson,  MD ?[facility] ?Office: [phone] ? ? ?See Amion for personal pager ?PCCM on call pager [phone] until 7pm. ?Please call Elink 7p-7a. [phone] ? ? ?  ?

## 2021-10-25 NOTE — Progress Notes (Signed)
SLP Cancellation Note ? ?Patient Details ?Name: Gina Petty ?MRN: [id number] ?DOB: April 17, 1946 ? ? ?Cancelled treatment:       Reason Eval/Treat Not Completed: Patient not medically ready;Other (comment) (patient continues to be on ventilator. SLP will follow for readiness) ? ? ?Sonia Baller, MA, CCC-SLP ?Speech Therapy ? ?

## 2021-10-25 NOTE — Progress Notes (Signed)
Chaplain engaged in an initial visit with Gina Petty, her daughter, and her son.  Timmya's family wanted to complete healthcare POA documents for both healthcare and finances.  Chaplain was able to find a notary willing to complete both because of circumstances.  Daughter expressed her frustration in needing to pay bills for [place].  Daughter voiced that she and her brother had tried to complete this paperwork when their father died but that Yekaterina was not yet in a place to talk about it.   ? ?Chaplain asked Tatianna several times if she wanted to complete the paperwork and name her two children as her healthcare agent and agents over her finances.  She shook her head "yes." Chaplain assisted Neyah and her family in completing paperwork. Chaplain provided the original and two copies to her family, and input one in her medical chart.  ? ?Chaplain also offered prayer over [place].   ? ?Chaplain offered education on hospital policy about healthcare POA documents, Chaplain provided Spiritual Support through prayer and making sure Tasneem was ok to complete documents, Chaplain served family through offering reflective listening as they shared their needs concerning their mom's health, and Chaplain worked with other members of the interdisciplinary team to fulfill Shy's needs and wishes.  ? ? ? 10/25/21 1200  ?Clinical Encounter Type  ?Visited With Patient and family together  ?Visit Type Spiritual support;Social support  ?Referral From Nurse;Family  ?Consult/Referral To Chaplain  ?Spiritual Encounters  ?Spiritual Needs Literature;Brochure  ?Stress Factors  ?Patient Stress Factors Health changes;Major life changes;Loss of control  ?Family Stress Factors Financial concerns;Major life changes;Health changes  ? ? ?

## 2021-10-25 NOTE — TOC Progression Note (Addendum)
Transition of Care (TOC) - Progression Note  ? ? ?Patient Details  ?Name: SYVILLA MARTIN ?MRN: [id number] ?Date of Birth: [birth year] ? ?Transition of Care (TOC) CM/SW Contact  ?Tawanna Cooler, RN ?Phone Number: ?10/25/2021, 10:25 AM ? ?Clinical Narrative:    ? ?Reached out to [facility] rep to see if [facility] bed available.   No bed today, possibly mid-week.  ?TOC following.  ? ?1240 Addendum: Received an update from [facility] at [facility] that they will have a bed available today, but patient will need to go after 1630-1700.   ?Nurse can call report to [phone].  Receiving physician will be Dr. Rudene Christians.  No bed assignment yet.  ? ?1307 Addendum: Room number will be 5E04. ? ? ?

## 2021-10-25 NOTE — Discharge Summary (Signed)
Physician Discharge Summary  ? ?  ? ? ? ? ?Patient ID: ?Gina Petty ?MRN: [id number] ?DOB/AGE: [birth year] 74 y.o. ? ?Admit date: 10/11/2021 ?Discharge date: 10/25/2021 ? ?Discharge Diagnoses:   ? ? ?Acute Hypoxic Respiratory Failure secondary to Community Acquired Pneumonia ? ? ? ?Active Hospital Problems  ? Diagnosis Date Noted  ? CAP (community acquired pneumonia) 10/11/2021  ? Respiratory failure with hypoxia ([place])   ? Pressure injury of skin 10/13/2021  ?  ?Resolved Hospital Problems  ?No resolved problems to display.  ?  ? ? ?Discharge summary   ? ?Gina Petty is a 76 y.o. F  with an extensive smoking history admitted on 3/27 severe acute respiratory failure with hypoxemia due to severe community acquired pneumonia.  Her clinical status worsened and she required transfer to ICU and intubation along with broad spectrum antibiotics and pressors.   Spontaneous breathing and wakening trials were complicated by delirium and she eventually required a tracheostomy on 10/21/21.   Her mental status eventually improved and she improved with Precedex.  Pt stable for discharge to [facility]. ? ?Discharge Plan by Active Problems   ? ? ?Acute hypoxemic respiratory failure ?Emphysema ?Community Acquired Pneumonia ?S/p tracheostomy 4/6 ?-routine trach care ?- Continue trach weaning ?- Continue ICS/LABA nebulizer treatments ?- Completed course of cefepime ?  ?Hx anxiety/depression- ?Acute metaboloic encephalopathy ?- continue precedex and prn Haldol as needed ?-oxycodone PRN ?- continue prn Haldol ?-on both Cymbalta and Wellbutrin PTA, continue Wellbutrin and held Cymbalta ?  ?Volume overloaded state of heart-  ? - Continue Lasix '40mg'$  daily ?  ?Acute liver injury ?Mildly elevated transaminases  ?Resolved and then re-occurred with Seroquel initiation ?RUQ [place] suggests fatty liver ?- Monitor ?- Dc seroquel ?  ?Anemia of critical illness  ?Did not require blood transfusion  ?- monitor ?- transfuse for Hgb <7 ?  ?HTN ?-held home home Diovan at  time of discharge secondary to soft blood pressures ?  ? ?Significant Hospital tests/ studies  ? ?4/9 RUQ [place]  ?Gallbladder sludge without gallbladder Buehrer thickening or ?pericholecystic fluid. Coarsening of the hepatic echotexture suggests fatty deposition. ? ? ?4/5 MRI Brain ?IMPRESSION: ?No acute intracranial process. No restricted diffusion or increased ?T2 signal to suggest anoxic brain injury. ? ? ?3/27 CT chest ?IMPRESSION: ?1. Multifocal bilateral airspace disease consistent with pneumonia. ?2. Small bilateral pleural effusions, right greater than left. ?3. No evidence of pulmonary embolus. ?4. Distal colonic diverticulosis without diverticulitis. ?5. Atrophic uterus, with fluid in the endometrial cavity. This is an ?abnormal finding in a patient of this age, and nonemergent ?outpatient follow-up pelvic ultrasound may be useful. ?6. Aortic Atherosclerosis (ICD10-I70.0) and Emphysema (ICD10-J43.9). ? ?  ?Procedures   ? ?4/4 Intubation ?4/6 Tracheostomy and diagnostic bronchoscopy ? ?Culture data/antimicrobials   ? ? ?Covid-19 and Flu, negative ? ?Respiratory Culture-normal respiratory flora ? ?  ?Consults  ? ?  ? ?Discharge Exam: ?BP (!) 115/52 (BP Location: Right Arm)   Pulse 69   Temp 98.4 ?F (36.9 ?C) (Oral)   Resp (!) 29   Ht '5\' 6"'$  (1.676 m)   Wt 87.5 kg   SpO2 95%   BMI 31.14 kg/m?  ? ?General:  chronically ill-appearing F, resting in bed awake in no acute distress ?HEENT: MM pink/moist, trach in place ?Neuro: tracks with eyes, nods to questions ?CV: s1s2 rrr, no m/r/g ?PULM:  on PSV ventilation through trach and without distress ?GI: soft, bsx4 active  ?Extremities: warm/dry, trace edema  ?Skin: no rashes or lesions ? ?  Labs at discharge  ? ?Lab Results  ?Component Value Date  ? CREATININE 0.41 (L) 10/25/2021  ? BUN 35 (H) 10/25/2021  ? NA 139 10/25/2021  ? K 3.9 10/25/2021  ? CL 102 10/25/2021  ? CO2 30 10/25/2021  ? ?Lab Results  ?Component Value Date  ? WBC 7.7 10/25/2021  ? HGB 11.0 (L)  10/25/2021  ? HCT 34.6 (L) 10/25/2021  ? MCV 101.2 (H) 10/25/2021  ? PLT 279 10/25/2021  ? ?Lab Results  ?Component Value Date  ? ALT 271 (H) 10/25/2021  ? AST 137 (H) 10/25/2021  ? ALKPHOS 93 10/25/2021  ? BILITOT 0.5 10/25/2021  ? ?Lab Results  ?Component Value Date  ? INR 1.1 10/25/2021  ? INR 1.1 10/24/2021  ? INR 1.3 (H) 10/11/2021  ? ? ?Current radiological studies   ? ?US Abdomen Limited RUQ (LIVER/GB) ? ?Result Date: 10/24/2021 ?CLINICAL DATA:  Transaminitis. EXAM: ULTRASOUND ABDOMEN LIMITED RIGHT UPPER QUADRANT COMPARISON:  None. FINDINGS: Gallbladder: No gallstones. Gallbladder Emond thickness upper normal at 3 mm. Intraluminal sludge evident. No sonographic Murphy sign noted by sonographer. Common bile duct: Diameter: 6 mm Liver: Coarsening of the hepatic echotexture suggests fatty deposition. Portal vein is patent on color Doppler imaging with normal direction of blood flow towards the liver. Other: None. IMPRESSION: Gallbladder sludge without gallbladder Ruddell thickening or pericholecystic fluid. Coarsening of the hepatic echotexture suggests fatty deposition. Electronically Signed   By: Misty Stanley M.D.   On: 10/24/2021 14:05   ? ?Disposition:   ? ? LTACH ? ? ? ?Allergies as of 10/25/2021   ? ?   Reactions  ? Penicillins Rash  ? Details not specified ?Tolerates Ancef  ? ?  ? ?  ?Medication List  ?  ? ?STOP taking these medications   ? ?CANNABIDIOL PO ?  ?diclofenac Sodium 1 % Gel ?Commonly known as: VOLTAREN ?  ?diphenhydramine-acetaminophen 25-500 MG Tabs tablet ?Commonly known as: TYLENOL PM ?  ?DULoxetine 30 MG capsule ?Commonly known as: CYMBALTA ?  ?HYDROcodone-acetaminophen 5-325 MG tablet ?Commonly known as: NORCO/VICODIN ?  ?valsartan-hydrochlorothiazide 160-25 MG tablet ?Commonly known as: DIOVAN-HCT ?  ? ?  ? ?TAKE these medications   ? ?albuterol (2.5 MG/3ML) 0.083% nebulizer solution ?Commonly known as: PROVENTIL ?Take 3 mLs (2.5 mg total) by nebulization every 2 (two) hours as needed for  wheezing. ?  ?arformoterol 15 MCG/2ML Nebu ?Commonly known as: BROVANA ?Take 2 mLs (15 mcg total) by nebulization 2 (two) times daily. ?  ?aspirin 81 MG chewable tablet ?Chew 1 tablet (81 mg total) by mouth 2 (two) times daily. ?  ?b complex vitamins tablet ?Take 1 tablet by mouth daily. ?  ?budesonide 0.5 MG/2ML nebulizer solution ?Commonly known as: PULMICORT ?Take 2 mLs (0.5 mg total) by nebulization 2 (two) times daily. ?  ?buPROPion 150 MG 24 hr tablet ?Commonly known as: WELLBUTRIN XL ?Take 150 mg by mouth daily. ?  ?clonazePAM 0.5 MG tablet ?Commonly known as: KLONOPIN ?Place 1 tablet (0.5 mg total) into feeding tube 2 (two) times daily. ?  ?dexmedetomidine 400 MCG/100ML Soln ?Commonly known as: PRECEDEX ?Inject 35.8-107.4 mcg/hr into the vein continuous. ?  ?docusate sodium 100 MG capsule ?Commonly known as: COLACE ?Take 1 capsule (100 mg total) by mouth 2 (two) times daily as needed for mild constipation. ?  ?docusate 50 MG/5ML liquid ?Commonly known as: COLACE ?Place 10 mLs (100 mg total) into feeding tube 2 (two) times daily. ?  ?enoxaparin 40 MG/0.4ML injection ?Commonly known as: LOVENOX ?Inject 0.4  mLs (40 mg total) into the skin daily. ?Start taking on: October 26, 2021 ?  ?feeding supplement (OSMOLITE 1.2 CAL) Liqd ?Place 1,000 mLs into feeding tube continuous. ?  ?feeding supplement (PROSource TF) liquid ?Place 90 mLs into feeding tube 2 (two) times daily. ?  ?furosemide 10 MG/ML injection ?Commonly known as: LASIX ?Inject 4 mLs (40 mg total) into the vein daily. ?  ?haloperidol lactate 5 MG/ML injection ?Commonly known as: HALDOL ?Inject 1 mL (5 mg total) into the vein every 6 (six) hours as needed. ?  ?insulin aspart 100 UNIT/ML injection ?Commonly known as: novoLOG ?Inject 2-6 Units into the skin every 4 (four) hours. ?  ?ipratropium 0.02 % nebulizer solution ?Commonly known as: ATROVENT ?Take 2.5 mLs (0.5 mg total) by nebulization every 2 (two) hours as needed for wheezing. ?  ?lip balm  ointment ?Apply topically as needed for lip care. ?  ?liver oil-zinc oxide 40 % ointment ?Commonly known as: DESITIN ?Apply topically 2 (two) times daily. ?  ?multivitamin with minerals Tabs tablet ?Take 1 tablet by mouth daily.

## 2021-10-25 NOTE — Evaluation (Signed)
Physical Therapy Evaluation ?Patient Details ?Name: Gina Petty ?MRN: [id number] ?DOB: 1945/12/22 ?Today's Date: 10/25/2021 ? ?History of Present Illness ? Patient is 76 y.o. female admitted to hospital on 3/27 severe acute respiratory failure with hypoxemia due to severe community acquired pneumonia. Patient intubated on 3/28 and converted to trach on 4/6. Pt failed SBT on 4/9. PMH significant for OA, depression, HTN, back surgery, Rt THA in 2021, Rt TKA in 2015. ?  ?Clinical Impression ? YAA DONNELLAN is 76 y.o. female admitted with above HPI and diagnosis. Patient is currently limited by functional impairments below (see PT problem list). Patient lives with her son and per pt's daughter, PTA was independent with no device for mobility at baseline. Evaluation limited to bed level due to pt being ventilated and fatiguing quickly. Patient was able to communicate needs 50% of time by mouthing words and writing on paper however handwriting not legible at times. Pt required assist to complete LE exercises while supine. Patient will benefit from continued skilled PT interventions to address impairments and progress independence with mobility, recommending LTACH with skilled PT. Acute PT will follow and progress as able.    ?   ? ?Recommendations for follow up therapy are one component of a multi-disciplinary discharge planning process, led by the attending physician.  Recommendations may be updated based on patient status, additional functional criteria and insurance authorization. ? ?Follow Up Recommendations PT at Long-term acute care hospital ? ?  ?Assistance Recommended at Discharge Frequent or constant Supervision/Assistance  ?Patient can return home with the following ?   ? ?  ?Equipment Recommendations  (tba)  ?Recommendations for Other Services ? OT consult  ?  ?Functional Status Assessment Patient has had a recent decline in their functional status and demonstrates the ability to make significant improvements in  function in a reasonable and predictable amount of time.  ? ?  ?Precautions / Restrictions Precautions ?Precautions: Fall;Other (comment) ?Precaution Comments: Vent (trach collar, Peep of 5, FiO2 40%, RR 20-40, SpO2 100%) ?Restrictions ?Weight Bearing Restrictions: No  ? ?  ? ?Mobility ? Bed Mobility ?  ?  ?  ?  ?  ?  ?  ?General bed mobility comments: bed level evaluation ?  ? ?Transfers ?  ?  ?  ?  ?  ?  ?  ?  ?  ?  ?  ? ?Ambulation/Gait ?  ?  ?  ?  ?  ?  ?  ?  ? ?Stairs ?  ?  ?  ?  ?  ? ?Wheelchair Mobility ?  ? ?Modified Rankin (Stroke Patients Only) ?  ? ?  ? ?Balance   ?  ?  ?  ?  ?  ?  ?  ?  ?  ?  ?  ?  ?  ?  ?  ?  ?  ?  ?   ? ? ? ?Pertinent Vitals/Pain Pain Assessment ?Pain Assessment: CPOT ?Facial Expression: Tense ?Body Movements: Absence of movements ?Muscle Tension: Relaxed ?Compliance with ventilator (intubated pts.): Tolerating ventilator or movement ?Vocalization (extubated pts.): N/A ?CPOT Total: 1 ?Pain Location: pt indicating trachis uncomforatble/hurts ?Pain Descriptors / Indicators: Discomfort, Grimacing ?Pain Intervention(s): Limited activity within patient's tolerance, Monitored during session  ? ? ?Home Living Family/patient expects to be discharged to:: Private residence ?Living Arrangements: Alone;Children (son lives with her per daughter) ?Available Help at Discharge: Family;Available PRN/intermittently ?Type of Home: House ?Home Access: Stairs to enter ?  ?  ?  ?Home Layout: Two  level;Full bath on main level;Able to live on main level with bedroom/bathroom;Bed/bath upstairs (sofa and recliner, no bedroom on main) ?  ?Additional Comments: per daughter pt's son lives with his mother, he works in evenings and is home during the day. pt's daugghter is out of state.  ?  ?Prior Function Prior Level of Function : Independent/Modified Independent ?  ?  ?  ?  ?  ?  ?Mobility Comments: per pt's daughter her mom was independent PTA and was doing her own driving, food shopping, cleaning, ADL's, and  really enjoyed shag dancing in the past but not sure how recently she had been going. ?ADLs Comments: per daughter pt has been independent ?  ? ? ?Hand Dominance  ? Dominant Hand: Right ? ?  ?Extremity/Trunk Assessment  ? Upper Extremity Assessment ?Upper Extremity Assessment: Defer to OT evaluation (pt able to reach bil UE to top of head and grip therapists fingers, Lt grip>Rt grip) ?  ? ?Lower Extremity Assessment ?Lower Extremity Assessment: LLE deficits/detail;RLE deficits/detail ?RLE Deficits / Details: hip flexion, knee flex/ext grossly 2+/5. ?LLE Deficits / Details: hip flexion, knee flex/ext grossly 2+/5. ?  ? ?Cervical / Trunk Assessment ?Cervical / Trunk Assessment: Normal  ?Communication  ? Communication: Tracheostomy (vent)  ?Cognition Arousal/Alertness: Awake/alert ?Behavior During Therapy: [facility] for tasks assessed/performed ?Overall Cognitive Status: Difficult to assess ?  ?  ?  ?  ?  ?  ?  ?  ?  ?  ?  ?  ?  ?  ?  ?  ?  ?  ?  ? ?  ?General Comments   ? ?  ?Exercises General Exercises - Lower Extremity ?Straight Leg Raises: AAROM, Both, 10 reps, Supine (partial ROM) ?Low Level/ICU Exercises ?Hip ABduction/ADduction: AAROM, Both, 10 reps, Supine ?Heel Slides: Both, 10 reps, Supine, Strengthening, AAROM (light resistance applied when extending LE)  ? ?Assessment/Plan  ?  ?PT Assessment Patient needs continued PT services  ?PT Problem List Decreased strength;Decreased activity tolerance;Decreased balance;Decreased mobility;Decreased knowledge of use of DME;Decreased safety awareness;Decreased knowledge of precautions;Cardiopulmonary status limiting activity ? ?   ?  ?PT Treatment Interventions DME instruction;Gait training;Stair training;Functional mobility training;Therapeutic activities;Therapeutic exercise;Balance training;Neuromuscular re-education;Patient/family education   ? ?PT Goals (Current goals can be found in the Care Plan section)  ?Acute Rehab PT Goals ?Patient Stated Goal: get strength and  independence back ?PT Goal Formulation: With patient/family ?Time For Goal Achievement: 11/08/21 ?Potential to Achieve Goals: Fair ? ?  ?Frequency Min 2X/week ?  ? ? ?Co-evaluation   ?  ?  ?  ?  ? ? ?  ?AM-PAC PT "6 Clicks" Mobility  ?Outcome Measure Help needed turning from your back to your side while in a flat bed without using bedrails?: Total ?Help needed moving from lying on your back to sitting on the side of a flat bed without using bedrails?: Total ?Help needed moving to and from a bed to a chair (including a wheelchair)?: Total ?Help needed standing up from a chair using your arms (e.g., wheelchair or bedside chair)?: Total ?Help needed to walk in hospital room?: Total ?Help needed climbing 3-5 steps with a railing? : Total ?6 Click Score: 6 ? ?  ?End of Session Equipment Utilized During Treatment: Oxygen (vent) ?Activity Tolerance: Patient tolerated treatment well;Patient limited by fatigue ?Patient left: in bed;with call bell/phone within reach;with family/visitor present;with nursing/sitter in room ?Nurse Communication: Mobility status ?PT Visit Diagnosis: Muscle weakness (generalized) (M62.81);Difficulty in walking, not elsewhere classified (R26.2);Other abnormalities of gait and mobility (  R26.89) ?  ? ?Time: 1962-2297 ?PT Time Calculation (min) (ACUTE ONLY): 27 min ? ? ?Charges:   PT Evaluation ?$PT Eval Moderate Complexity: 1 Mod ?PT Treatments ?$Therapeutic Activity: 8-22 mins ?  ?   ? ? ?Gwynneth Albright PT, DPT ?Acute Rehabilitation Services ?Office [phone] ?Pager [phone]  ? ?Jacques Navy ?10/25/2021, 1:05 PM ? ?

## 2021-10-26 ENCOUNTER — Other Ambulatory Visit (HOSPITAL_COMMUNITY): Payer: Medicare Other

## 2021-10-26 DIAGNOSIS — F419 Anxiety disorder, unspecified: Secondary | ICD-10-CM

## 2021-10-26 DIAGNOSIS — J9690 Respiratory failure, unspecified, unspecified whether with hypoxia or hypercapnia: Secondary | ICD-10-CM | POA: Diagnosis not present

## 2021-10-26 DIAGNOSIS — J449 Chronic obstructive pulmonary disease, unspecified: Secondary | ICD-10-CM

## 2021-10-26 DIAGNOSIS — J159 Unspecified bacterial pneumonia: Secondary | ICD-10-CM | POA: Diagnosis not present

## 2021-10-26 DIAGNOSIS — Z930 Tracheostomy status: Secondary | ICD-10-CM | POA: Diagnosis not present

## 2021-10-26 DIAGNOSIS — J189 Pneumonia, unspecified organism: Secondary | ICD-10-CM

## 2021-10-26 DIAGNOSIS — J9601 Acute respiratory failure with hypoxia: Secondary | ICD-10-CM | POA: Diagnosis not present

## 2021-10-26 DIAGNOSIS — J9621 Acute and chronic respiratory failure with hypoxia: Secondary | ICD-10-CM | POA: Diagnosis not present

## 2021-10-26 DIAGNOSIS — D649 Anemia, unspecified: Secondary | ICD-10-CM | POA: Diagnosis not present

## 2021-10-26 DIAGNOSIS — J9811 Atelectasis: Secondary | ICD-10-CM | POA: Diagnosis not present

## 2021-10-26 DIAGNOSIS — M6281 Muscle weakness (generalized): Secondary | ICD-10-CM | POA: Diagnosis not present

## 2021-10-26 DIAGNOSIS — Z4682 Encounter for fitting and adjustment of non-vascular catheter: Secondary | ICD-10-CM | POA: Diagnosis not present

## 2021-10-26 DIAGNOSIS — Z452 Encounter for adjustment and management of vascular access device: Secondary | ICD-10-CM | POA: Diagnosis not present

## 2021-10-26 LAB — CBC
HCT: 44.6 % (ref 36.0–46.0)
Hemoglobin: 13.1 g/dL (ref 12.0–15.0)
MCH: 32 pg (ref 26.0–34.0)
MCHC: 29.4 g/dL — ABNORMAL LOW (ref 30.0–36.0)
MCV: 108.8 fL — ABNORMAL HIGH (ref 80.0–100.0)
Platelets: 156 10*3/uL (ref 150–400)
RBC: 4.1 MIL/uL (ref 3.87–5.11)
RDW: 13.1 % (ref 11.5–15.5)
WBC: 6.7 10*3/uL (ref 4.0–10.5)
nRBC: 0 % (ref 0.0–0.2)

## 2021-10-26 LAB — URINALYSIS, ROUTINE W REFLEX MICROSCOPIC
Bilirubin Urine: NEGATIVE
Glucose, UA: NEGATIVE mg/dL
Hgb urine dipstick: NEGATIVE
Ketones, ur: NEGATIVE mg/dL
Leukocytes,Ua: NEGATIVE
Nitrite: NEGATIVE
Protein, ur: NEGATIVE mg/dL
Specific Gravity, Urine: 1.009 (ref 1.005–1.030)
pH: 7 (ref 5.0–8.0)

## 2021-10-26 LAB — COMPREHENSIVE METABOLIC PANEL
ALT: 286 U/L — ABNORMAL HIGH (ref 0–44)
AST: 144 U/L — ABNORMAL HIGH (ref 15–41)
Albumin: 2.3 g/dL — ABNORMAL LOW (ref 3.5–5.0)
Alkaline Phosphatase: 123 U/L (ref 38–126)
Anion gap: 5 (ref 5–15)
BUN: 23 mg/dL (ref 8–23)
CO2: 30 mmol/L (ref 22–32)
Calcium: 8.8 mg/dL — ABNORMAL LOW (ref 8.9–10.3)
Chloride: 106 mmol/L (ref 98–111)
Creatinine, Ser: 0.59 mg/dL (ref 0.44–1.00)
GFR, Estimated: >60 mL/min (ref >60)
Glucose, Bld: 116 mg/dL — ABNORMAL HIGH (ref 70–99)
Potassium: 3.9 mmol/L (ref 3.5–5.1)
Sodium: 141 mmol/L (ref 135–145)
Total Bilirubin: 1.5 mg/dL — ABNORMAL HIGH (ref 0.3–1.2)
Total Protein: 5.9 g/dL — ABNORMAL LOW (ref 6.5–8.1)

## 2021-10-26 LAB — APTT: aPTT: 25 seconds (ref 24–36)

## 2021-10-26 LAB — PROTIME-INR
INR: 1.1 (ref 0.8–1.2)
Prothrombin Time: 13.6 seconds (ref 11.4–15.2)

## 2021-10-26 NOTE — Consult Note (Signed)
Pulmonary Critical Care Medicine ?[place]  ?PULMONARY SERVICE ? ?Date of Service: 10/26/2021 ? ?PULMONARY CRITICAL CARE CONSULT ? ? ?[name]  ?CZY:[id number]  ?DOB: 30-Jan-1946  ? ?DOA: 10/25/2021 ? ?Referring Physician: Satira Sark, MD ? ?HPI: Gina Petty is a 76 y.o. female seen for follow up of Acute on Chronic Respiratory Failure.  Patient has multiple medical problems including hypertension depression pulmonary embolism chronic arthritis came into the hospital because of increasing shortness of breath which has gotten worse over the course of several days.  Patient had not been able to do her normal activities and she also had a cough associated with occasional sputum.  Patient also had been noted to have fevers.  Her respiratory status declined and she ended up having to be intubated placed on mechanical ventilation subsequently was not able to come off the ventilator had to have a tracheostomy.  Other complications included development of sepsis hypotension requiring pressors she also had significant delirium and has continued to have issues with anxiety limiting her ability to wean ? ?Review of Systems:  ?ROS performed and is unremarkable other than noted above. ? ?Past Medical History:  ?Diagnosis Date  ? Arthritis   ? Depression   ? Hypertension   ? PE (pulmonary embolism)   ? posssible 74  not able to prove  ? ? ?Past Surgical History:  ?Procedure Laterality Date  ? BACK SURGERY  85  ? CATARACT EXTRACTION    ? COLONOSCOPY  06/12/2006 last   ? POLYPECTOMY    ? TOTAL HIP ARTHROPLASTY Right 10/08/2019  ? Procedure: RIGHT TOTAL HIP ARTHROPLASTY ANTERIOR APPROACH;  Surgeon: Melrose Nakayama, MD;  Location: [facility];  Service: Orthopedics;  Laterality: Right;  ? TOTAL KNEE ARTHROPLASTY Right 11/26/2013  ? DR DALLDORF  ? TOTAL KNEE ARTHROPLASTY Right 11/26/2013  ? Procedure: TOTAL KNEE ARTHROPLASTY;  Surgeon: [name], MD;  Location: [facility];  Service: Orthopedics;  Laterality: Right;   Right total knee arthroplasty  ? ? ?Social History:  ? ? reports that she quit smoking about 20 years ago. Her smoking use included cigarettes. She has a 25.00 pack-year smoking history. She has never used smokeless tobacco. She reports current alcohol use of about 7.0 standard drinks per week. She reports that she does not use drugs. ? ?Family History: ?Non-Contributory to the present illness ? ?Allergies  ?Allergen Reactions  ? Penicillins Rash  ?  Details not specified ?Tolerates Ancef  ? ? ?Medications: ?Reviewed on Rounds ? ?Physical Exam: ? ?Vitals: Temperature is 99.7 pulse 92 respiratory 22 blood pressure is 136/80 saturations were 98% ? ?Ventilator Settings on the ventilator currently on assist control mode she did do pressure support for 2 hours ? ?General: Comfortable at this time ?Eyes: Grossly normal lids, irises & conjunctiva ?ENT: grossly tongue is normal ?Neck: no obvious mass ?Cardiovascular: S1-S2 normal no gallop or rub noted at this time ?Respiratory: Coarse rhonchi are noted bilaterally ?Abdomen: Soft and nontender ?Skin: no rash seen on limited exam ?Musculoskeletal: not rigid ?Psychiatric:unable to assess ?Neurologic: no seizure no involuntary movements   ?   ?   ?Labs on Admission:  ?Basic Metabolic Panel: ?Recent Labs  ?Lab 10/21/21 ?0233 10/22/21 ?0335 10/23/21 ?0304 10/24/21 ?0302 10/25/21 ?0932 10/26/21 ?3557  ?NA 140 138 136 140 139 141  ?K 4.1 4.3 3.9 3.6 3.9 3.9  ?CL 99 97* 101 101 102 106  ?CO2 33* 34* 31 32 30 30  ?GLUCOSE 121* 119* 108* 115* 119* 116*  ?  BUN 39* 35* 26* 33* 35* 23  ?CREATININE 0.63 0.62 0.56 0.52 0.41* 0.59  ?CALCIUM 8.7* 8.4* 8.2* 8.7* 8.6* 8.8*  ?MG 2.4 2.3 2.1 2.2 2.0  --   ?PHOS 3.9 3.8 4.0 4.4 4.1  --   ? ? ?Recent Labs  ?Lab 10/20/21 ?1122 10/25/21 ?2302  ?PHART 7.53* 7.51*  ?PCO2ART 55* 42  ?PO2ART 75* 68*  ?HCO3 46.0* 33.5*  ?O2SAT 99.3 96.2  ? ? ?Liver Function Tests: ?Recent Labs  ?Lab 10/22/21 ?0335 10/23/21 ?0304 10/24/21 ?0302 10/25/21 ?3976  10/26/21 ?7341  ?AST 56* 133* 139* 137* 144*  ?ALT 91* 189* 245* 271* 286*  ?ALKPHOS 65 77 94 93 123  ?BILITOT 0.8 0.6 0.5 0.5 1.5*  ?PROT 5.8* 5.7* 6.0* 5.7* 5.9*  ?ALBUMIN 2.5* 2.4* 2.6* 2.3* 2.3*  ? ?No results for input(s): LIPASE, AMYLASE in the last 168 hours. ?No results for input(s): AMMONIA in the last 168 hours. ? ?CBC: ?Recent Labs  ?Lab 10/22/21 ?0335 10/23/21 ?0304 10/23/21 ?2336 10/25/21 ?0259 10/26/21 ?0429  ?WBC 8.9 9.1 8.7 7.7 6.7  ?HGB 11.0* 10.6* 10.3* 11.0* 13.1  ?HCT 34.1* 33.4* 32.0* 34.6* 44.6  ?MCV 102.7* 103.1* 102.2* 101.2* 108.8*  ?PLT 269 286 273 279 156  ? ? ?Cardiac Enzymes: ?No results for input(s): CKTOTAL, CKMB, CKMBINDEX, TROPONINI in the last 168 hours. ? ?BNP (last 3 results) ?Recent Labs  ?  10/12/21 ?0245  ?BNP 265.8*  ? ? ?ProBNP (last 3 results) ?No results for input(s): PROBNP in the last 8760 hours. ? ? ?Radiological Exams on Admission: ?DG Abd 1 View ? ?Result Date: 10/22/2021 ?CLINICAL DATA:  Evaluate location of enteric tube EXAM: ABDOMEN - 1 VIEW COMPARISON:  10/21/2021 FINDINGS: Tip of enteric tube is seen in the antrum of the stomach. Bowel gas pattern in the upper abdomen is unremarkable. Bilateral pleural effusions are seen. There are patchy infiltrates in both lower lung fields suggesting multifocal atelectasis/pneumonia. IMPRESSION: Tip of enteric tube is seen in the antrum of the stomach. Bilateral pleural effusions. There are patchy infiltrates in both lower lung fields. Electronically Signed   By: Elmer Picker M.D.   On: 10/22/2021 17:44  ? ?DG CHEST PORT 1 VIEW ? ?Result Date: 10/26/2021 ?CLINICAL DATA:  Respiratory failure, check feeding catheter placement EXAM: PORTABLE CHEST 1 VIEW COMPARISON:  10/21/2021 FINDINGS: Tracheostomy tube is noted in satisfactory position. Feeding catheter extends into the stomach although the tip is not visualized on this exam. Stable bibasilar atelectasis is noted. No pneumothorax is seen. IMPRESSION: Feeding catheter  extending into the stomach. Bibasilar atelectasis. Electronically Signed   By: Inez Catalina M.D.   On: 10/26/2021 02:31  ? ?DG Abd Portable 1V ? ?Result Date: 10/26/2021 ?CLINICAL DATA:  Check gastric catheter placement EXAM: PORTABLE ABDOMEN - 1 VIEW COMPARISON:  None. FINDINGS: Weighted feeding catheter is noted coiled within the stomach. IMPRESSION: Feeding catheter in the stomach. Electronically Signed   By: Inez Catalina M.D.   On: 10/26/2021 02:32  ? ?US Abdomen Limited RUQ (LIVER/GB) ? ?Result Date: 10/24/2021 ?CLINICAL DATA:  Transaminitis. EXAM: ULTRASOUND ABDOMEN LIMITED RIGHT UPPER QUADRANT COMPARISON:  None. FINDINGS: Gallbladder: No gallstones. Gallbladder Samudio thickness upper normal at 3 mm. Intraluminal sludge evident. No sonographic Murphy sign noted by sonographer. Common bile duct: Diameter: 6 mm Liver: Coarsening of the hepatic echotexture suggests fatty deposition. Portal vein is patent on color Doppler imaging with normal direction of blood flow towards the liver. Other: None. IMPRESSION: Gallbladder sludge without gallbladder Tobia thickening or pericholecystic fluid.  Coarsening of the hepatic echotexture suggests fatty deposition. Electronically Signed   By: Misty Stanley M.D.   On: 10/24/2021 14:05   ? ?Assessment/Plan ?Active Problems: ?  CAP (community acquired pneumonia) ?  Acute on chronic respiratory failure with hypoxia (HCC) ?  Anxiety disorder ?  Tracheostomy status ([place]) ?  COPD, severe ([place]) ? ? ?Acute on chronic respiratory failure hypoxia patient is going to be continued on the weaning protocol with the plan of doing pressure support wean graduating into T collar trials as tolerated.  Patient was able to complete 2 hours of initial T collar evaluation and currently is resting back on the ventilator. ?Community-acquired pneumonia patient had been treated with antibiotics at the other facility plan is going to be to follow-up on chest films.  Most recent chest film showing bibasilar  atelectasis ?Severe COPD patient has history of emphysema and COPD plan is going to be to continue with inhalers and nebulizers as needed ?Tracheostomy status patient underwent tracheostomy on 6 April we will have to wait

## 2021-10-27 DIAGNOSIS — J159 Unspecified bacterial pneumonia: Secondary | ICD-10-CM | POA: Diagnosis not present

## 2021-10-27 DIAGNOSIS — D649 Anemia, unspecified: Secondary | ICD-10-CM | POA: Diagnosis not present

## 2021-10-27 DIAGNOSIS — J9601 Acute respiratory failure with hypoxia: Secondary | ICD-10-CM | POA: Diagnosis not present

## 2021-10-27 DIAGNOSIS — F419 Anxiety disorder, unspecified: Secondary | ICD-10-CM | POA: Diagnosis not present

## 2021-10-27 DIAGNOSIS — J9621 Acute and chronic respiratory failure with hypoxia: Secondary | ICD-10-CM | POA: Diagnosis not present

## 2021-10-27 DIAGNOSIS — J449 Chronic obstructive pulmonary disease, unspecified: Secondary | ICD-10-CM | POA: Diagnosis not present

## 2021-10-27 DIAGNOSIS — J189 Pneumonia, unspecified organism: Secondary | ICD-10-CM | POA: Diagnosis not present

## 2021-10-27 DIAGNOSIS — Z930 Tracheostomy status: Secondary | ICD-10-CM | POA: Diagnosis not present

## 2021-10-27 DIAGNOSIS — M6281 Muscle weakness (generalized): Secondary | ICD-10-CM | POA: Diagnosis not present

## 2021-10-27 LAB — CULTURE, RESPIRATORY W GRAM STAIN: Culture: NORMAL

## 2021-10-27 NOTE — Progress Notes (Addendum)
Pulmonary Critical Care Medicine ?[facility] ?  ?PULMONARY CRITICAL CARE SERVICE ? ?PROGRESS NOTE ? ? ? ? ?Andee Poles Imbert  ?KYH:[id number]  ?DOB: 1946-05-12  ? ?DOA: 10/25/2021 ? ?Referring Physician: Satira Sark, MD ? ?HPI: GLENYS SNADER is a 76 y.o. female being followed for ventilator/airway/oxygen weaning Acute on Chronic Respiratory Failure.  Patient seen sitting up in bed this morning.  Currently on pressure support.  She did complete 2 hours of pressure support yesterday.  Trach sutures remain in place.  No acute distress, no acute overnight events. ? ?Medications: ?Reviewed on Rounds ? ?Physical Exam: ? ?Vitals: Temp 98.4, pulse 102, respirations 19, BP 140/79, SPO2 97% ? ?Ventilator Settings pressure support 35% 12/5 ? ?General: Comfortable at this time ?Neck: supple ?Cardiovascular: no malignant arrhythmias ?Respiratory: Bilaterally clear ?Skin: no rash seen on limited exam ?Musculoskeletal: No gross abnormality ?Psychiatric:unable to assess ?Neurologic:no involuntary movements   ?   ?   ?Lab Data:  ? ?Basic Metabolic Panel: ?Recent Labs  ?Lab 10/21/21 ?0233 10/22/21 ?0335 10/23/21 ?0304 10/24/21 ?0302 10/25/21 ?1517 10/26/21 ?6160  ?NA 140 138 136 140 139 141  ?K 4.1 4.3 3.9 3.6 3.9 3.9  ?CL 99 97* 101 101 102 106  ?CO2 33* 34* 31 32 30 30  ?GLUCOSE 121* 119* 108* 115* 119* 116*  ?BUN 39* 35* 26* 33* 35* 23  ?CREATININE 0.63 0.62 0.56 0.52 0.41* 0.59  ?CALCIUM 8.7* 8.4* 8.2* 8.7* 8.6* 8.8*  ?MG 2.4 2.3 2.1 2.2 2.0  --   ?PHOS 3.9 3.8 4.0 4.4 4.1  --   ? ? ?ABG: ?Recent Labs  ?Lab 10/20/21 ?1122 10/25/21 ?2302  ?PHART 7.53* 7.51*  ?PCO2ART 55* 42  ?PO2ART 75* 68*  ?HCO3 46.0* 33.5*  ?O2SAT 99.3 96.2  ? ? ?Liver Function Tests: ?Recent Labs  ?Lab 10/22/21 ?0335 10/23/21 ?0304 10/24/21 ?0302 10/25/21 ?7371 10/26/21 ?0626  ?AST 56* 133* 139* 137* 144*  ?ALT 91* 189* 245* 271* 286*  ?ALKPHOS 65 77 94 93 123  ?BILITOT 0.8 0.6 0.5 0.5 1.5*  ?PROT 5.8* 5.7* 6.0* 5.7* 5.9*  ?ALBUMIN 2.5* 2.4* 2.6*  2.3* 2.3*  ? ?No results for input(s): LIPASE, AMYLASE in the last 168 hours. ?No results for input(s): AMMONIA in the last 168 hours. ? ?CBC: ?Recent Labs  ?Lab 10/22/21 ?0335 10/23/21 ?0304 10/23/21 ?2336 10/25/21 ?0259 10/26/21 ?0429  ?WBC 8.9 9.1 8.7 7.7 6.7  ?HGB 11.0* 10.6* 10.3* 11.0* 13.1  ?HCT 34.1* 33.4* 32.0* 34.6* 44.6  ?MCV 102.7* 103.1* 102.2* 101.2* 108.8*  ?PLT 269 286 273 279 156  ? ? ?Cardiac Enzymes: ?No results for input(s): CKTOTAL, CKMB, CKMBINDEX, TROPONINI in the last 168 hours. ? ?BNP (last 3 results) ?Recent Labs  ?  10/12/21 ?0245  ?BNP 265.8*  ? ? ?ProBNP (last 3 results) ?No results for input(s): PROBNP in the last 8760 hours. ? ?Radiological Exams: ?DG Abd 1 View ? ?Result Date: 10/26/2021 ?CLINICAL DATA:  Nasogastric tube placement. EXAM: ABDOMEN - 1 VIEW COMPARISON:  None. FINDINGS: The bowel gas pattern is normal. Distal tip of feeding tube is seen in expected position of distal stomach. No radio-opaque calculi or other significant radiographic abnormality are seen. IMPRESSION: Distal tip of feeding tube seen in expected position of distal stomach. Electronically Signed   By: Marijo Conception M.D.   On: 10/26/2021 11:57  ? ?DG CHEST PORT 1 VIEW ? ?Result Date: 10/26/2021 ?CLINICAL DATA:  Respiratory failure, check feeding catheter placement EXAM: PORTABLE CHEST 1 VIEW COMPARISON:  10/21/2021 FINDINGS: Tracheostomy tube is noted in satisfactory position. Feeding catheter extends into the stomach although the tip is not visualized on this exam. Stable bibasilar atelectasis is noted. No pneumothorax is seen. IMPRESSION: Feeding catheter extending into the stomach. Bibasilar atelectasis. Electronically Signed   By: Inez Catalina M.D.   On: 10/26/2021 02:31  ? ?DG Abd Portable 1V ? ?Result Date: 10/26/2021 ?CLINICAL DATA:  Check gastric catheter placement EXAM: PORTABLE ABDOMEN - 1 VIEW COMPARISON:  None. FINDINGS: Weighted feeding catheter is noted coiled within the stomach. IMPRESSION:  Feeding catheter in the stomach. Electronically Signed   By: Inez Catalina M.D.   On: 10/26/2021 02:32   ? ?Assessment/Plan ?Active Problems: ?  CAP (community acquired pneumonia) ?  Acute on chronic respiratory failure with hypoxia (HCC) ?  Anxiety disorder ?  Tracheostomy status ([place]) ?  COPD, severe ([place]) ? ?Acute on chronic respiratory failure hypoxia-patient completed 2 hours of pressure support yesterday.  She has a goal of 4 hours pressure support today.  Continue weaning per protocol.  Continue with pulmonary toilet.  Trach sutures remain in place. ?Community-acquired pneumonia- patient had been treated with antibiotics at the other facility. plan is going to be to follow-up on chest films.  Most recent chest film showing bibasilar atelectasis ?Severe COPD- patient has history of emphysema and COPD. plan is going to be to continue with inhalers and nebulizers as needed. ?Tracheostomy status- patient underwent tracheostomy on 6 April we will have to wait until full duration for changing the trach we will continue to monitor along closely.  Tracheal sutures in place. ?Anxiety- patient does have significant anxiety along with metabolic encephalopathy which has cleared but the anxiety does remain a major issue as far as being able to advance further on the weaning. ?  ? ? ?I have personally seen and evaluated the patient, evaluated laboratory and imaging results, formulated the assessment and plan and placed orders. ?The Patient requires high complexity decision making with multiple systems involvement.  ?Rounds were done with the Respiratory Therapy Director and Staff therapists and discussed with nursing staff also. ? ?Allyne Gee, MD FCCP ?Pulmonary Critical Care Medicine ?Sleep Medicine  ?

## 2021-10-28 DIAGNOSIS — J9621 Acute and chronic respiratory failure with hypoxia: Secondary | ICD-10-CM | POA: Diagnosis not present

## 2021-10-28 DIAGNOSIS — J189 Pneumonia, unspecified organism: Secondary | ICD-10-CM | POA: Diagnosis not present

## 2021-10-28 DIAGNOSIS — F419 Anxiety disorder, unspecified: Secondary | ICD-10-CM | POA: Diagnosis not present

## 2021-10-28 DIAGNOSIS — D649 Anemia, unspecified: Secondary | ICD-10-CM | POA: Diagnosis not present

## 2021-10-28 DIAGNOSIS — J9601 Acute respiratory failure with hypoxia: Secondary | ICD-10-CM | POA: Diagnosis not present

## 2021-10-28 DIAGNOSIS — Z930 Tracheostomy status: Secondary | ICD-10-CM | POA: Diagnosis not present

## 2021-10-28 DIAGNOSIS — J449 Chronic obstructive pulmonary disease, unspecified: Secondary | ICD-10-CM | POA: Diagnosis not present

## 2021-10-28 DIAGNOSIS — M6281 Muscle weakness (generalized): Secondary | ICD-10-CM | POA: Diagnosis not present

## 2021-10-28 DIAGNOSIS — J159 Unspecified bacterial pneumonia: Secondary | ICD-10-CM | POA: Diagnosis not present

## 2021-10-28 LAB — URINE CULTURE: Culture: >=100000 — AB

## 2021-10-28 NOTE — Progress Notes (Signed)
Pulmonary Critical Care Medicine ?[place] ?  ?PULMONARY CRITICAL CARE SERVICE ? ?PROGRESS NOTE ? ? ? ? ?Gina Petty  ?POE:[id number]  ?DOB: 02-Jan-1946  ? ?DOA: 10/25/2021 ? ?Referring Physician: Satira Sark, MD ? ?HPI: Gina Petty is a 76 y.o. female being followed for ventilator/airway/oxygen weaning Acute on Chronic Respiratory Failure.  Patient is on pressure support mode currently on 30% FiO2 pressure 12/5 ? ?Medications: ?Reviewed on Rounds ? ?Physical Exam: ? ?Vitals: Temperature is 100.2 pulse of 98 respiratory is 18 blood pressure 118/63 saturations 95% ? ?Ventilator Settings pressure support FiO2 30% pressure 12/5 ? ?General: Comfortable at this time ?Neck: supple ?Cardiovascular: no malignant arrhythmias ?Respiratory: Scattered rhonchi expansion is equal ?Skin: no rash seen on limited exam ?Musculoskeletal: No gross abnormality ?Psychiatric:unable to assess ?Neurologic:no involuntary movements   ?   ?   ?Lab Data:  ? ?Basic Metabolic Panel: ?Recent Labs  ?Lab 10/22/21 ?0335 10/23/21 ?0304 10/24/21 ?0302 10/25/21 ?3154 10/26/21 ?0086  ?NA 138 136 140 139 141  ?K 4.3 3.9 3.6 3.9 3.9  ?CL 97* 101 101 102 106  ?CO2 34* 31 32 30 30  ?GLUCOSE 119* 108* 115* 119* 116*  ?BUN 35* 26* 33* 35* 23  ?CREATININE 0.62 0.56 0.52 0.41* 0.59  ?CALCIUM 8.4* 8.2* 8.7* 8.6* 8.8*  ?MG 2.3 2.1 2.2 2.0  --   ?PHOS 3.8 4.0 4.4 4.1  --   ? ? ?ABG: ?Recent Labs  ?Lab 10/25/21 ?2302  ?PHART 7.51*  ?PCO2ART 42  ?PO2ART 68*  ?HCO3 33.5*  ?O2SAT 96.2  ? ? ?Liver Function Tests: ?Recent Labs  ?Lab 10/22/21 ?0335 10/23/21 ?0304 10/24/21 ?0302 10/25/21 ?7619 10/26/21 ?5093  ?AST 56* 133* 139* 137* 144*  ?ALT 91* 189* 245* 271* 286*  ?ALKPHOS 65 77 94 93 123  ?BILITOT 0.8 0.6 0.5 0.5 1.5*  ?PROT 5.8* 5.7* 6.0* 5.7* 5.9*  ?ALBUMIN 2.5* 2.4* 2.6* 2.3* 2.3*  ? ?No results for input(s): LIPASE, AMYLASE in the last 168 hours. ?No results for input(s): AMMONIA in the last 168 hours. ? ?CBC: ?Recent Labs  ?Lab  10/22/21 ?0335 10/23/21 ?0304 10/23/21 ?2336 10/25/21 ?0259 10/26/21 ?0429  ?WBC 8.9 9.1 8.7 7.7 6.7  ?HGB 11.0* 10.6* 10.3* 11.0* 13.1  ?HCT 34.1* 33.4* 32.0* 34.6* 44.6  ?MCV 102.7* 103.1* 102.2* 101.2* 108.8*  ?PLT 269 286 273 279 156  ? ? ?Cardiac Enzymes: ?No results for input(s): CKTOTAL, CKMB, CKMBINDEX, TROPONINI in the last 168 hours. ? ?BNP (last 3 results) ?Recent Labs  ?  10/12/21 ?0245  ?BNP 265.8*  ? ? ?ProBNP (last 3 results) ?No results for input(s): PROBNP in the last 8760 hours. ? ?Radiological Exams: ?DG Abd 1 View ? ?Result Date: 10/26/2021 ?CLINICAL DATA:  Nasogastric tube placement. EXAM: ABDOMEN - 1 VIEW COMPARISON:  None. FINDINGS: The bowel gas pattern is normal. Distal tip of feeding tube is seen in expected position of distal stomach. No radio-opaque calculi or other significant radiographic abnormality are seen. IMPRESSION: Distal tip of feeding tube seen in expected position of distal stomach. Electronically Signed   By: Marijo Conception M.D.   On: 10/26/2021 11:57   ? ?Assessment/Plan ?Active Problems: ?  CAP (community acquired pneumonia) ?  Acute on chronic respiratory failure with hypoxia (HCC) ?  Anxiety disorder ?  Tracheostomy status ([place]) ?  COPD, severe ([place]) ? ? ?Acute on chronic respiratory failure with hypoxia we will continue with the pressure support goal of 8 hours ?Community-acquired pneumonia no change we will  continue to follow along ?Anxiety disorder no change ?Severe COPD medical management ?Tracheostomy remains in place ? ? ?I have personally seen and evaluated the patient, evaluated laboratory and imaging results, formulated the assessment and plan and placed orders. ?The Patient requires high complexity decision making with multiple systems involvement.  ?Rounds were done with the Respiratory Therapy Director and Staff therapists and discussed with nursing staff also. ? ?Allyne Gee, MD FCCP ?Pulmonary Critical Care Medicine ?Sleep Medicine ? ?

## 2021-10-29 DIAGNOSIS — Z930 Tracheostomy status: Secondary | ICD-10-CM | POA: Diagnosis not present

## 2021-10-29 DIAGNOSIS — J449 Chronic obstructive pulmonary disease, unspecified: Secondary | ICD-10-CM | POA: Diagnosis not present

## 2021-10-29 DIAGNOSIS — J9621 Acute and chronic respiratory failure with hypoxia: Secondary | ICD-10-CM | POA: Diagnosis not present

## 2021-10-29 DIAGNOSIS — F419 Anxiety disorder, unspecified: Secondary | ICD-10-CM | POA: Diagnosis not present

## 2021-10-29 DIAGNOSIS — J189 Pneumonia, unspecified organism: Secondary | ICD-10-CM | POA: Diagnosis not present

## 2021-10-29 LAB — COMPREHENSIVE METABOLIC PANEL
ALT: 166 U/L — ABNORMAL HIGH (ref 0–44)
AST: 61 U/L — ABNORMAL HIGH (ref 15–41)
Albumin: 2.2 g/dL — ABNORMAL LOW (ref 3.5–5.0)
Alkaline Phosphatase: 116 U/L (ref 38–126)
Anion gap: 5 (ref 5–15)
BUN: 24 mg/dL — ABNORMAL HIGH (ref 8–23)
CO2: 32 mmol/L (ref 22–32)
Calcium: 8.6 mg/dL — ABNORMAL LOW (ref 8.9–10.3)
Chloride: 100 mmol/L (ref 98–111)
Creatinine, Ser: 0.56 mg/dL (ref 0.44–1.00)
GFR, Estimated: >60 mL/min (ref >60)
Glucose, Bld: 123 mg/dL — ABNORMAL HIGH (ref 70–99)
Potassium: 3.7 mmol/L (ref 3.5–5.1)
Sodium: 137 mmol/L (ref 135–145)
Total Bilirubin: 0.7 mg/dL (ref 0.3–1.2)
Total Protein: 6 g/dL — ABNORMAL LOW (ref 6.5–8.1)

## 2021-10-29 LAB — CBC
HCT: 33.9 % — ABNORMAL LOW (ref 36.0–46.0)
Hemoglobin: 10.4 g/dL — ABNORMAL LOW (ref 12.0–15.0)
MCH: 31.3 pg (ref 26.0–34.0)
MCHC: 30.7 g/dL (ref 30.0–36.0)
MCV: 102.1 fL — ABNORMAL HIGH (ref 80.0–100.0)
Platelets: 231 10*3/uL (ref 150–400)
RBC: 3.32 MIL/uL — ABNORMAL LOW (ref 3.87–5.11)
RDW: 13.5 % (ref 11.5–15.5)
WBC: 8 10*3/uL (ref 4.0–10.5)
nRBC: 0 % (ref 0.0–0.2)

## 2021-10-29 NOTE — Progress Notes (Signed)
Pulmonary Critical Care Medicine ?[place] ?  ?PULMONARY CRITICAL CARE SERVICE ? ?PROGRESS NOTE ? ? ? ? ?Gina Petty  ?ALP:[id number]  ?DOB: 02/07/1946  ? ?DOA: 10/25/2021 ? ?Referring Physician: Satira Sark, MD ? ?HPI: Gina Petty is a 76 y.o. female being followed for ventilator/airway/oxygen weaning Acute on Chronic Respiratory Failure.  Patient is afebrile resting comfortably had a fever earlier now is down to 98.1 patient has been cultured ? ?Medications: ?Reviewed on Rounds ? ?Physical Exam: ? ?Vitals: Temperature is 98.1 pulse 93 respiratory 22 blood pressure 125/66 saturations 98% ? ?Ventilator Settings on assist control FiO2 30% tidal volume 431 PEEP 5 ? ?General: Comfortable at this time ?Neck: supple ?Cardiovascular: no malignant arrhythmias ?Respiratory: Scattered rhonchi expansion is equal ?Skin: no rash seen on limited exam ?Musculoskeletal: No gross abnormality ?Psychiatric:unable to assess ?Neurologic:no involuntary movements   ?   ?   ?Lab Data:  ? ?Basic Metabolic Panel: ?Recent Labs  ?Lab 10/23/21 ?0304 10/24/21 ?0302 10/25/21 ?3532 10/26/21 ?9924 10/29/21 ?2683  ?NA 136 140 139 141 137  ?K 3.9 3.6 3.9 3.9 3.7  ?CL 101 101 102 106 100  ?CO2 31 32 30 30 32  ?GLUCOSE 108* 115* 119* 116* 123*  ?BUN 26* 33* 35* 23 24*  ?CREATININE 0.56 0.52 0.41* 0.59 0.56  ?CALCIUM 8.2* 8.7* 8.6* 8.8* 8.6*  ?MG 2.1 2.2 2.0  --   --   ?PHOS 4.0 4.4 4.1  --   --   ? ? ?ABG: ?Recent Labs  ?Lab 10/25/21 ?2302  ?PHART 7.51*  ?PCO2ART 42  ?PO2ART 68*  ?HCO3 33.5*  ?O2SAT 96.2  ? ? ?Liver Function Tests: ?Recent Labs  ?Lab 10/23/21 ?0304 10/24/21 ?0302 10/25/21 ?4196 10/26/21 ?2229 10/29/21 ?7989  ?AST 133* 139* 137* 144* 61*  ?ALT 189* 245* 271* 286* 166*  ?ALKPHOS 77 94 93 123 116  ?BILITOT 0.6 0.5 0.5 1.5* 0.7  ?PROT 5.7* 6.0* 5.7* 5.9* 6.0*  ?ALBUMIN 2.4* 2.6* 2.3* 2.3* 2.2*  ? ?No results for input(s): LIPASE, AMYLASE in the last 168 hours. ?No results for input(s): AMMONIA in the last 168  hours. ? ?CBC: ?Recent Labs  ?Lab 10/23/21 ?0304 10/23/21 ?2336 10/25/21 ?0259 10/26/21 ?0429 10/29/21 ?2119  ?WBC 9.1 8.7 7.7 6.7 8.0  ?HGB 10.6* 10.3* 11.0* 13.1 10.4*  ?HCT 33.4* 32.0* 34.6* 44.6 33.9*  ?MCV 103.1* 102.2* 101.2* 108.8* 102.1*  ?PLT 286 273 279 156 231  ? ? ?Cardiac Enzymes: ?No results for input(s): CKTOTAL, CKMB, CKMBINDEX, TROPONINI in the last 168 hours. ? ?BNP (last 3 results) ?Recent Labs  ?  10/12/21 ?0245  ?BNP 265.8*  ? ? ?ProBNP (last 3 results) ?No results for input(s): PROBNP in the last 8760 hours. ? ?Radiological Exams: ?No results found. ? ?Assessment/Plan ?Active Problems: ?  CAP (community acquired pneumonia) ?  Acute on chronic respiratory failure with hypoxia (HCC) ?  Anxiety disorder ?  Tracheostomy status ([place]) ?  COPD, severe ([place]) ? ? ?Acute on chronic respiratory failure hypoxia we will continue with full support on the ventilator for now while sepsis is worked up community-acquired pneumonia has been treated with antibiotics ?Community-acquired pneumonia has been treated ?Anxiety disorder no change we will continue to follow-up ?Severe COPD medical management ?Tracheostomy remains in place ? ? ?I have personally seen and evaluated the patient, evaluated laboratory and imaging results, formulated the assessment and plan and placed orders. ?The Patient requires high complexity decision making with multiple systems involvement.  ?Rounds were done with the  Respiratory Therapy Director and Staff therapists and discussed with nursing staff also. ? ?Allyne Gee, MD FCCP ?Pulmonary Critical Care Medicine ?Sleep Medicine ? ?

## 2021-10-30 DIAGNOSIS — J9621 Acute and chronic respiratory failure with hypoxia: Secondary | ICD-10-CM | POA: Diagnosis not present

## 2021-10-30 DIAGNOSIS — J189 Pneumonia, unspecified organism: Secondary | ICD-10-CM | POA: Diagnosis not present

## 2021-10-30 DIAGNOSIS — J449 Chronic obstructive pulmonary disease, unspecified: Secondary | ICD-10-CM | POA: Diagnosis not present

## 2021-10-30 DIAGNOSIS — Z930 Tracheostomy status: Secondary | ICD-10-CM | POA: Diagnosis not present

## 2021-10-30 DIAGNOSIS — F419 Anxiety disorder, unspecified: Secondary | ICD-10-CM | POA: Diagnosis not present

## 2021-10-30 LAB — CBC
HCT: 32.6 % — ABNORMAL LOW (ref 36.0–46.0)
Hemoglobin: 10.3 g/dL — ABNORMAL LOW (ref 12.0–15.0)
MCH: 31.6 pg (ref 26.0–34.0)
MCHC: 31.6 g/dL (ref 30.0–36.0)
MCV: 100 fL (ref 80.0–100.0)
Platelets: 223 10*3/uL (ref 150–400)
RBC: 3.26 MIL/uL — ABNORMAL LOW (ref 3.87–5.11)
RDW: 13.6 % (ref 11.5–15.5)
WBC: 7.8 10*3/uL (ref 4.0–10.5)
nRBC: 0 % (ref 0.0–0.2)

## 2021-10-30 LAB — BASIC METABOLIC PANEL
Anion gap: 7 (ref 5–15)
BUN: 23 mg/dL (ref 8–23)
CO2: 30 mmol/L (ref 22–32)
Calcium: 8.7 mg/dL — ABNORMAL LOW (ref 8.9–10.3)
Chloride: 103 mmol/L (ref 98–111)
Creatinine, Ser: 0.55 mg/dL (ref 0.44–1.00)
GFR, Estimated: >60 mL/min (ref >60)
Glucose, Bld: 120 mg/dL — ABNORMAL HIGH (ref 70–99)
Potassium: 5.2 mmol/L — ABNORMAL HIGH (ref 3.5–5.1)
Sodium: 140 mmol/L (ref 135–145)

## 2021-10-30 LAB — MAGNESIUM: Magnesium: 2 mg/dL (ref 1.7–2.4)

## 2021-10-30 NOTE — Progress Notes (Signed)
Pulmonary Critical Care Medicine ?[name]  ?PULMONARY CRITICAL CARE SERVICE ? ?PROGRESS NOTE ? ? ? ? ?Gina Petty  ?HAL:[id number]  ?DOB: 18-Feb-1946  ? ?DOA: 10/25/2021 ? ?Referring Physician: Satira Sark, MD ? ?HPI: Gina Petty is a 76 y.o. female being followed for ventilator/airway/oxygen weaning Acute on Chronic Respiratory Failure.  Patient still having copious secretions has been on assist control mode on 30% FiO2 ? ?Medications: ?Reviewed on Rounds ? ?Physical Exam: ? ?Vitals: Temperature was 100.2 pulse of 98 respiratory 25 blood pressure 124/65 saturations 100% ? ?Ventilator Settings on assist control FiO2 is 30% tidal volume 470 PEEP of 5 ? ?General: Comfortable at this time ?Neck: supple ?Cardiovascular: no malignant arrhythmias ?Respiratory: Coarse rhonchi expansion is equal ?Skin: no rash seen on limited exam ?Musculoskeletal: No gross abnormality ?Psychiatric:unable to assess ?Neurologic:no involuntary movements   ?   ?   ?Lab Data:  ? ?Basic Metabolic Panel: ?Recent Labs  ?Lab 10/24/21 ?0302 10/25/21 ?0259 10/26/21 ?7353 10/29/21 ?2992 10/30/21 ?0324  ?NA 140 139 141 137 140  ?K 3.6 3.9 3.9 3.7 5.2*  ?CL 101 102 106 100 103  ?CO2 32 30 30 32 30  ?GLUCOSE 115* 119* 116* 123* 120*  ?BUN 33* 35* 23 24* 23  ?CREATININE 0.52 0.41* 0.59 0.56 0.55  ?CALCIUM 8.7* 8.6* 8.8* 8.6* 8.7*  ?MG 2.2 2.0  --   --  2.0  ?PHOS 4.4 4.1  --   --   --   ? ? ?ABG: ?Recent Labs  ?Lab 10/25/21 ?2302  ?PHART 7.51*  ?PCO2ART 42  ?PO2ART 68*  ?HCO3 33.5*  ?O2SAT 96.2  ? ? ?Liver Function Tests: ?Recent Labs  ?Lab 10/24/21 ?0302 10/25/21 ?0259 10/26/21 ?4268 10/29/21 ?3419  ?AST 139* 137* 144* 61*  ?ALT 245* 271* 286* 166*  ?ALKPHOS 94 93 123 116  ?BILITOT 0.5 0.5 1.5* 0.7  ?PROT 6.0* 5.7* 5.9* 6.0*  ?ALBUMIN 2.6* 2.3* 2.3* 2.2*  ? ?No results for input(s): LIPASE, AMYLASE in the last 168 hours. ?No results for input(s): AMMONIA in the last 168 hours. ? ?CBC: ?Recent Labs  ?Lab 10/23/21 ?2336  10/25/21 ?0259 10/26/21 ?0429 10/29/21 ?6222 10/30/21 ?0324  ?WBC 8.7 7.7 6.7 8.0 7.8  ?HGB 10.3* 11.0* 13.1 10.4* 10.3*  ?HCT 32.0* 34.6* 44.6 33.9* 32.6*  ?MCV 102.2* 101.2* 108.8* 102.1* 100.0  ?PLT 273 279 156 231 223  ? ? ?Cardiac Enzymes: ?No results for input(s): CKTOTAL, CKMB, CKMBINDEX, TROPONINI in the last 168 hours. ? ?BNP (last 3 results) ?Recent Labs  ?  10/12/21 ?0245  ?BNP 265.8*  ? ? ?ProBNP (last 3 results) ?No results for input(s): PROBNP in the last 8760 hours. ? ?Radiological Exams: ?No results found. ? ?Assessment/Plan ?Active Problems: ?  CAP (community acquired pneumonia) ?  Acute on chronic respiratory failure with hypoxia (HCC) ?  Anxiety disorder ?  Tracheostomy status ([place]) ?  COPD, severe ([place]) ? ? ?Acute on chronic respiratory failure hypoxia we will continue with secretion management for now remains on the ventilator ?Community-acquired pneumonia has been treated with antibiotics we will continue to monitor ?Anxiety disorder no change we will continue to follow along ?Tracheostomy remains in place ?Severe COPD medical management ? ? ?I have personally seen and evaluated the patient, evaluated laboratory and imaging results, formulated the assessment and plan and placed orders. ?The Patient requires high complexity decision making with multiple systems involvement.  ?Rounds were done with the Respiratory Therapy Director and Staff therapists and discussed with nursing  staff also. ? ?Allyne Gee, MD FCCP ?Pulmonary Critical Care Medicine ?Sleep Medicine  ?

## 2021-10-31 DIAGNOSIS — Z930 Tracheostomy status: Secondary | ICD-10-CM | POA: Diagnosis not present

## 2021-10-31 DIAGNOSIS — J9621 Acute and chronic respiratory failure with hypoxia: Secondary | ICD-10-CM | POA: Diagnosis not present

## 2021-10-31 DIAGNOSIS — J449 Chronic obstructive pulmonary disease, unspecified: Secondary | ICD-10-CM | POA: Diagnosis not present

## 2021-10-31 DIAGNOSIS — J189 Pneumonia, unspecified organism: Secondary | ICD-10-CM | POA: Diagnosis not present

## 2021-10-31 DIAGNOSIS — F419 Anxiety disorder, unspecified: Secondary | ICD-10-CM | POA: Diagnosis not present

## 2021-10-31 LAB — POTASSIUM: Potassium: 3.5 mmol/L (ref 3.5–5.1)

## 2021-10-31 LAB — VANCOMYCIN, TROUGH: Vancomycin Tr: 5 ug/mL — ABNORMAL LOW (ref 15–20)

## 2021-11-01 DIAGNOSIS — J9621 Acute and chronic respiratory failure with hypoxia: Secondary | ICD-10-CM | POA: Diagnosis not present

## 2021-11-01 DIAGNOSIS — F419 Anxiety disorder, unspecified: Secondary | ICD-10-CM | POA: Diagnosis not present

## 2021-11-01 DIAGNOSIS — J189 Pneumonia, unspecified organism: Secondary | ICD-10-CM | POA: Diagnosis not present

## 2021-11-01 DIAGNOSIS — Z930 Tracheostomy status: Secondary | ICD-10-CM | POA: Diagnosis not present

## 2021-11-01 DIAGNOSIS — J449 Chronic obstructive pulmonary disease, unspecified: Secondary | ICD-10-CM | POA: Diagnosis not present

## 2021-11-01 LAB — MAGNESIUM: Magnesium: 1.9 mg/dL (ref 1.7–2.4)

## 2021-11-01 LAB — CBC
HCT: 29.5 % — ABNORMAL LOW (ref 36.0–46.0)
Hemoglobin: 9.2 g/dL — ABNORMAL LOW (ref 12.0–15.0)
MCH: 31.3 pg (ref 26.0–34.0)
MCHC: 31.2 g/dL (ref 30.0–36.0)
MCV: 100.3 fL — ABNORMAL HIGH (ref 80.0–100.0)
Platelets: 200 10*3/uL (ref 150–400)
RBC: 2.94 MIL/uL — ABNORMAL LOW (ref 3.87–5.11)
RDW: 13.6 % (ref 11.5–15.5)
WBC: 5.1 10*3/uL (ref 4.0–10.5)
nRBC: 0 % (ref 0.0–0.2)

## 2021-11-01 LAB — BASIC METABOLIC PANEL
Anion gap: 6 (ref 5–15)
BUN: 24 mg/dL — ABNORMAL HIGH (ref 8–23)
CO2: 31 mmol/L (ref 22–32)
Calcium: 8.7 mg/dL — ABNORMAL LOW (ref 8.9–10.3)
Chloride: 103 mmol/L (ref 98–111)
Creatinine, Ser: 0.57 mg/dL (ref 0.44–1.00)
GFR, Estimated: >60 mL/min (ref >60)
Glucose, Bld: 109 mg/dL — ABNORMAL HIGH (ref 70–99)
Potassium: 3.6 mmol/L (ref 3.5–5.1)
Sodium: 140 mmol/L (ref 135–145)

## 2021-11-01 LAB — VANCOMYCIN, TROUGH: Vancomycin Tr: 16 ug/mL (ref 15–20)

## 2021-11-01 NOTE — Progress Notes (Signed)
Pulmonary Critical Care Medicine ?[place] ?  ?PULMONARY CRITICAL CARE SERVICE ? ?PROGRESS NOTE ? ? ? ? ?Gina Petty  ?[id number]:[id number]  ?DOB: 1945-09-21  ? ?DOA: 10/25/2021 ? ?Referring Physician: Satira Sark, MD ? ?HPI: Gina Petty is a 76 y.o. female being followed for ventilator/airway/oxygen weaning Acute on Chronic Respiratory Failure.  Patient is on pressure support mode has been on 28% FiO2 goal of 8 hours ? ?Medications: ?Reviewed on Rounds ? ?Physical Exam: ? ?Vitals: Temperature is 96.3 pulse of 89 respiratory is 19 blood pressure 126/64 saturations 98% ? ?Ventilator Settings on pressure support FiO2 28% pressure 12/5 ? ?General: Comfortable at this time ?Neck: supple ?Cardiovascular: no malignant arrhythmias ?Respiratory: Scattered rhonchi expansion is equal ?Skin: no rash seen on limited exam ?Musculoskeletal: No gross abnormality ?Psychiatric:unable to assess ?Neurologic:no involuntary movements   ?   ?   ?Lab Data:  ? ?Basic Metabolic Panel: ?Recent Labs  ?Lab 10/26/21 ?0657 10/29/21 ?9244 10/30/21 ?6286 10/31/21 ?3817 11/01/21 ?0358  ?NA 141 137 140  --  140  ?K 3.9 3.7 5.2* 3.5 3.6  ?CL 106 100 103  --  103  ?CO2 30 32 30  --  31  ?GLUCOSE 116* 123* 120*  --  109*  ?BUN 23 24* 23  --  24*  ?CREATININE 0.59 0.56 0.55  --  0.57  ?CALCIUM 8.8* 8.6* 8.7*  --  8.7*  ?MG  --   --  2.0  --  1.9  ? ? ?ABG: ?Recent Labs  ?Lab 10/25/21 ?2302  ?PHART 7.51*  ?PCO2ART 42  ?PO2ART 68*  ?HCO3 33.5*  ?O2SAT 96.2  ? ? ?Liver Function Tests: ?Recent Labs  ?Lab 10/26/21 ?0657 10/29/21 ?7116  ?AST 144* 61*  ?ALT 286* 166*  ?ALKPHOS 123 116  ?BILITOT 1.5* 0.7  ?PROT 5.9* 6.0*  ?ALBUMIN 2.3* 2.2*  ? ?No results for input(s): LIPASE, AMYLASE in the last 168 hours. ?No results for input(s): AMMONIA in the last 168 hours. ? ?CBC: ?Recent Labs  ?Lab 10/26/21 ?0429 10/29/21 ?5790 10/30/21 ?3833 11/01/21 ?0358  ?WBC 6.7 8.0 7.8 5.1  ?HGB 13.1 10.4* 10.3* 9.2*  ?HCT 44.6 33.9* 32.6* 29.5*  ?MCV 108.8*  102.1* 100.0 100.3*  ?PLT 156 231 223 200  ? ? ?Cardiac Enzymes: ?No results for input(s): CKTOTAL, CKMB, CKMBINDEX, TROPONINI in the last 168 hours. ? ?BNP (last 3 results) ?Recent Labs  ?  10/12/21 ?0245  ?BNP 265.8*  ? ? ?ProBNP (last 3 results) ?No results for input(s): PROBNP in the last 8760 hours. ? ?Radiological Exams: ?No results found. ? ?Assessment/Plan ?Active Problems: ?  CAP (community acquired pneumonia) ?  Acute on chronic respiratory failure with hypoxia (HCC) ?  Anxiety disorder ?  Tracheostomy status ([place]) ?  COPD, severe ([place]) ? ? ?Acute on chronic respiratory failure with hypoxia wean for 8 hours on pressure support 12/5 ?Community-acquired pneumonia no change we will continue to monitor closely ?Anxiety disorder remains at baseline ?Severe COPD nebulizers and medical management ?Tracheostomy remains in place ? ? ?I have personally seen and evaluated the patient, evaluated laboratory and imaging results, formulated the assessment and plan and placed orders. ?The Patient requires high complexity decision making with multiple systems involvement.  ?Rounds were done with the Respiratory Therapy Director and Staff therapists and discussed with nursing staff also. ? ?Allyne Gee, MD FCCP ?Pulmonary Critical Care Medicine ?Sleep Medicine ? ?

## 2021-11-01 NOTE — Progress Notes (Signed)
Pulmonary Critical Care Medicine ?[place] ?  ?PULMONARY CRITICAL CARE SERVICE ? ?PROGRESS NOTE ? ? ? ? ?Andee Poles Kimm  ?[id number]  ?DOB: 1946-05-22  ? ?DOA: 10/25/2021 ? ?Referring Physician: Satira Sark, MD ? ?HPI: Gina Petty is a 76 y.o. female being followed for ventilator/airway/oxygen weaning Acute on Chronic Respiratory Failure.  Patient is on full support on the ventilator has copious secretions still remains on assist control mode ? ?Medications: ?Reviewed on Rounds ? ?Physical Exam: ? ?Vitals: Temperature is 99.9 pulse 106 respiratory 20 blood pressure 125/71 saturations 95% ? ?Ventilator Settings on assist control FiO2 is 30% tidal volume 470 PEEP 5 ? ?General: Comfortable at this time ?Neck: supple ?Cardiovascular: no malignant arrhythmias ?Respiratory: Scattered rhonchi expansion is equal ?Skin: no rash seen on limited exam ?Musculoskeletal: No gross abnormality ?Psychiatric:unable to assess ?Neurologic:no involuntary movements   ?   ?   ?Lab Data:  ? ?Basic Metabolic Panel: ?Recent Labs  ?Lab 10/26/21 ?0657 10/29/21 ?1696 10/30/21 ?7893 10/31/21 ?8101 11/01/21 ?0358  ?NA 141 137 140  --  140  ?K 3.9 3.7 5.2* 3.5 3.6  ?CL 106 100 103  --  103  ?CO2 30 32 30  --  31  ?GLUCOSE 116* 123* 120*  --  109*  ?BUN 23 24* 23  --  24*  ?CREATININE 0.59 0.56 0.55  --  0.57  ?CALCIUM 8.8* 8.6* 8.7*  --  8.7*  ?MG  --   --  2.0  --  1.9  ? ? ?ABG: ?Recent Labs  ?Lab 10/25/21 ?2302  ?PHART 7.51*  ?PCO2ART 42  ?PO2ART 68*  ?HCO3 33.5*  ?O2SAT 96.2  ? ? ?Liver Function Tests: ?Recent Labs  ?Lab 10/26/21 ?0657 10/29/21 ?7510  ?AST 144* 61*  ?ALT 286* 166*  ?ALKPHOS 123 116  ?BILITOT 1.5* 0.7  ?PROT 5.9* 6.0*  ?ALBUMIN 2.3* 2.2*  ? ?No results for input(s): LIPASE, AMYLASE in the last 168 hours. ?No results for input(s): AMMONIA in the last 168 hours. ? ?CBC: ?Recent Labs  ?Lab 10/26/21 ?0429 10/29/21 ?2585 10/30/21 ?2778 11/01/21 ?0358  ?WBC 6.7 8.0 7.8 5.1  ?HGB 13.1 10.4* 10.3* 9.2*  ?HCT  44.6 33.9* 32.6* 29.5*  ?MCV 108.8* 102.1* 100.0 100.3*  ?PLT 156 231 223 200  ? ? ?Cardiac Enzymes: ?No results for input(s): CKTOTAL, CKMB, CKMBINDEX, TROPONINI in the last 168 hours. ? ?BNP (last 3 results) ?Recent Labs  ?  10/12/21 ?0245  ?BNP 265.8*  ? ? ?ProBNP (last 3 results) ?No results for input(s): PROBNP in the last 8760 hours. ? ?Radiological Exams: ?No results found. ? ?Assessment/Plan ?Active Problems: ?  CAP (community acquired pneumonia) ?  Acute on chronic respiratory failure with hypoxia (HCC) ?  Anxiety disorder ?  Tracheostomy status ([place]) ?  COPD, severe ([place]) ? ? ?Acute on chronic respiratory failure with hypoxia on assist control mode titrate oxygen as tolerated right now is on 30% FiO2 ?Community-acquired pneumonia has been treated with antibiotic ?Anxiety disorder no change ?Severe COPD medical management ?Tracheostomy remains in place at this time ? ? ?I have personally seen and evaluated the patient, evaluated laboratory and imaging results, formulated the assessment and plan and placed orders. ?The Patient requires high complexity decision making with multiple systems involvement.  ?Rounds were done with the Respiratory Therapy Director and Staff therapists and discussed with nursing staff also. ? ?Allyne Gee, MD FCCP ?Pulmonary Critical Care Medicine ?Sleep Medicine ? ?

## 2021-11-02 DIAGNOSIS — J9621 Acute and chronic respiratory failure with hypoxia: Secondary | ICD-10-CM | POA: Diagnosis not present

## 2021-11-02 DIAGNOSIS — J449 Chronic obstructive pulmonary disease, unspecified: Secondary | ICD-10-CM | POA: Diagnosis not present

## 2021-11-02 DIAGNOSIS — J189 Pneumonia, unspecified organism: Secondary | ICD-10-CM | POA: Diagnosis not present

## 2021-11-02 DIAGNOSIS — Z930 Tracheostomy status: Secondary | ICD-10-CM | POA: Diagnosis not present

## 2021-11-02 DIAGNOSIS — F419 Anxiety disorder, unspecified: Secondary | ICD-10-CM | POA: Diagnosis not present

## 2021-11-02 LAB — URINALYSIS, ROUTINE W REFLEX MICROSCOPIC
Bilirubin Urine: NEGATIVE
Glucose, UA: NEGATIVE mg/dL
Hgb urine dipstick: NEGATIVE
Ketones, ur: NEGATIVE mg/dL
Leukocytes,Ua: NEGATIVE
Nitrite: NEGATIVE
Protein, ur: NEGATIVE mg/dL
Specific Gravity, Urine: 1.017 (ref 1.005–1.030)
pH: 6 (ref 5.0–8.0)

## 2021-11-02 NOTE — Progress Notes (Signed)
Pulmonary Critical Care Medicine ?[place] ?  ?PULMONARY CRITICAL CARE SERVICE ? ?PROGRESS NOTE ? ? ? ? ?Gina Petty  ?[id number]:[id number]  ?DOB: [birth year]  ? ?DOA: 10/25/2021 ? ?Referring Physician: Satira Sark, MD ? ?HPI: Gina Petty is a 76 y.o. female being followed for ventilator/airway/oxygen weaning Acute on Chronic Respiratory Failure.  Patient currently is on pressure support mode has been on 28% FiO2 saturations are good ? ?Medications: ?Reviewed on Rounds ? ?Physical Exam: ? ?Vitals: Temperature is 99.7 pulse of 94 respiratory rate is 29 blood pressure 175/67 saturations 94% ? ?Ventilator Settings on pressure support FiO2 is 28% pressure 12/5 ? ?General: Comfortable at this time ?Neck: supple ?Cardiovascular: no malignant arrhythmias ?Respiratory: Scattered rhonchi very coarse breath sounds ?Skin: no rash seen on limited exam ?Musculoskeletal: No gross abnormality ?Psychiatric:unable to assess ?Neurologic:no involuntary movements   ?   ?   ?Lab Data:  ? ?Basic Metabolic Panel: ?Recent Labs  ?Lab 10/29/21 ?7703 10/30/21 ?4035 10/31/21 ?2481 11/01/21 ?0358  ?NA 137 140  --  140  ?K 3.7 5.2* 3.5 3.6  ?CL 100 103  --  103  ?CO2 32 30  --  31  ?GLUCOSE 123* 120*  --  109*  ?BUN 24* 23  --  24*  ?CREATININE 0.56 0.55  --  0.57  ?CALCIUM 8.6* 8.7*  --  8.7*  ?MG  --  2.0  --  1.9  ? ? ?ABG: ?No results for input(s): PHART, PCO2ART, PO2ART, HCO3, O2SAT in the last 168 hours. ? ?Liver Function Tests: ?Recent Labs  ?Lab 10/29/21 ?8590  ?AST 61*  ?ALT 166*  ?ALKPHOS 116  ?BILITOT 0.7  ?PROT 6.0*  ?ALBUMIN 2.2*  ? ?No results for input(s): LIPASE, AMYLASE in the last 168 hours. ?No results for input(s): AMMONIA in the last 168 hours. ? ?CBC: ?Recent Labs  ?Lab 10/29/21 ?9311 10/30/21 ?2162 11/01/21 ?0358  ?WBC 8.0 7.8 5.1  ?HGB 10.4* 10.3* 9.2*  ?HCT 33.9* 32.6* 29.5*  ?MCV 102.1* 100.0 100.3*  ?PLT 231 223 200  ? ? ?Cardiac Enzymes: ?No results for input(s): CKTOTAL, CKMB, CKMBINDEX, TROPONINI in  the last 168 hours. ? ?BNP (last 3 results) ?Recent Labs  ?  10/12/21 ?0245  ?BNP 265.8*  ? ? ?ProBNP (last 3 results) ?No results for input(s): PROBNP in the last 8760 hours. ? ?Radiological Exams: ?No results found. ? ?Assessment/Plan ?Active Problems: ?  CAP (community acquired pneumonia) ?  Acute on chronic respiratory failure with hypoxia (HCC) ?  Anxiety disorder ?  Tracheostomy status ([place]) ?  COPD, severe ([place]) ? ? ?Acute on chronic respiratory failure hypoxia patient has been weaning on pressure support doing well the goal is for 12 hours ?Community-acquired pneumonia has been treated with antibiotics we will continue to follow along closely ?Anxiety disorder no change continue to monitor ?Severe COPD medical management ?Tracheostomy remain in place for now ? ? ?I have personally seen and evaluated the patient, evaluated laboratory and imaging results, formulated the assessment and plan and placed orders. ?The Patient requires high complexity decision making with multiple systems involvement.  ?Rounds were done with the Respiratory Therapy Director and Staff therapists and discussed with nursing staff also. ? ?Allyne Gee, MD FCCP ?Pulmonary Critical Care Medicine ?Sleep Medicine ? ?

## 2021-11-03 DIAGNOSIS — J9621 Acute and chronic respiratory failure with hypoxia: Secondary | ICD-10-CM | POA: Diagnosis not present

## 2021-11-03 DIAGNOSIS — J189 Pneumonia, unspecified organism: Secondary | ICD-10-CM | POA: Diagnosis not present

## 2021-11-03 DIAGNOSIS — Z930 Tracheostomy status: Secondary | ICD-10-CM | POA: Diagnosis not present

## 2021-11-03 DIAGNOSIS — J9601 Acute respiratory failure with hypoxia: Secondary | ICD-10-CM | POA: Diagnosis not present

## 2021-11-03 DIAGNOSIS — J159 Unspecified bacterial pneumonia: Secondary | ICD-10-CM | POA: Diagnosis not present

## 2021-11-03 DIAGNOSIS — J449 Chronic obstructive pulmonary disease, unspecified: Secondary | ICD-10-CM | POA: Diagnosis not present

## 2021-11-03 DIAGNOSIS — D649 Anemia, unspecified: Secondary | ICD-10-CM | POA: Diagnosis not present

## 2021-11-03 DIAGNOSIS — M6281 Muscle weakness (generalized): Secondary | ICD-10-CM | POA: Diagnosis not present

## 2021-11-03 DIAGNOSIS — F419 Anxiety disorder, unspecified: Secondary | ICD-10-CM | POA: Diagnosis not present

## 2021-11-03 LAB — URINE CULTURE: Culture: NO GROWTH

## 2021-11-03 LAB — CULTURE, BLOOD (ROUTINE X 2)
Culture: NO GROWTH
Culture: NO GROWTH

## 2021-11-03 NOTE — Progress Notes (Signed)
Pulmonary Critical Care Medicine ?[place] ?  ?PULMONARY CRITICAL CARE SERVICE ? ?PROGRESS NOTE ? ? ? ? ?Andee Poles Hoskin  ?[id number]:[id number]  ?DOB: 07/12/1946  ? ?DOA: 10/25/2021 ? ?Referring Physician: Satira Sark, MD ? ?HPI: HENNY STRAUCH is a 76 y.o. female being followed for ventilator/airway/oxygen weaning Acute on Chronic Respiratory Failure.  Patient is on pressure support has been on 28% FiO2 the goal is for 16 hours ? ?Medications: ?Reviewed on Rounds ? ?Physical Exam: ? ?Vitals: Temperature is 98.5 pulse 104 respiratory is 30 blood pressure 114/78 saturations 94% ? ?Ventilator Settings on pressure support FiO2 28% pressure 12/5 ? ?General: Comfortable at this time ?Neck: supple ?Cardiovascular: no malignant arrhythmias ?Respiratory: Coarse rhonchi expansion is equal ?Skin: no rash seen on limited exam ?Musculoskeletal: No gross abnormality ?Psychiatric:unable to assess ?Neurologic:no involuntary movements   ?   ?   ?Lab Data:  ? ?Basic Metabolic Panel: ?Recent Labs  ?Lab 10/29/21 ?3354 10/30/21 ?5625 10/31/21 ?6389 11/01/21 ?0358  ?NA 137 140  --  140  ?K 3.7 5.2* 3.5 3.6  ?CL 100 103  --  103  ?CO2 32 30  --  31  ?GLUCOSE 123* 120*  --  109*  ?BUN 24* 23  --  24*  ?CREATININE 0.56 0.55  --  0.57  ?CALCIUM 8.6* 8.7*  --  8.7*  ?MG  --  2.0  --  1.9  ? ? ?ABG: ?No results for input(s): PHART, PCO2ART, PO2ART, HCO3, O2SAT in the last 168 hours. ? ?Liver Function Tests: ?Recent Labs  ?Lab 10/29/21 ?3734  ?AST 61*  ?ALT 166*  ?ALKPHOS 116  ?BILITOT 0.7  ?PROT 6.0*  ?ALBUMIN 2.2*  ? ?No results for input(s): LIPASE, AMYLASE in the last 168 hours. ?No results for input(s): AMMONIA in the last 168 hours. ? ?CBC: ?Recent Labs  ?Lab 10/29/21 ?2876 10/30/21 ?8115 11/01/21 ?0358  ?WBC 8.0 7.8 5.1  ?HGB 10.4* 10.3* 9.2*  ?HCT 33.9* 32.6* 29.5*  ?MCV 102.1* 100.0 100.3*  ?PLT 231 223 200  ? ? ?Cardiac Enzymes: ?No results for input(s): CKTOTAL, CKMB, CKMBINDEX, TROPONINI in the last 168 hours. ? ?BNP  (last 3 results) ?Recent Labs  ?  10/12/21 ?0245  ?BNP 265.8*  ? ? ?ProBNP (last 3 results) ?No results for input(s): PROBNP in the last 8760 hours. ? ?Radiological Exams: ?No results found. ? ?Assessment/Plan ?Active Problems: ?  CAP (community acquired pneumonia) ?  Acute on chronic respiratory failure with hypoxia (HCC) ?  Anxiety disorder ?  Tracheostomy status ([place]) ?  COPD, severe ([place]) ? ? ?Acute on chronic respiratory failure with hypoxia we will continue with pressure support mode goal is 16 hours we will continue to monitor closely ?Community-acquired pneumonia has been treated with antibiotics ?Anxiety disorder supportive care medical management ?Severe COPD medical management ?Tracheostomy remains in place for ventilator support ? ? ?I have personally seen and evaluated the patient, evaluated laboratory and imaging results, formulated the assessment and plan and placed orders. ?The Patient requires high complexity decision making with multiple systems involvement.  ?Rounds were done with the Respiratory Therapy Director and Staff therapists and discussed with nursing staff also. ? ?Allyne Gee, MD FCCP ?Pulmonary Critical Care Medicine ?Sleep Medicine ? ?

## 2021-11-04 ENCOUNTER — Other Ambulatory Visit (HOSPITAL_COMMUNITY): Payer: Medicare Other

## 2021-11-04 DIAGNOSIS — J189 Pneumonia, unspecified organism: Secondary | ICD-10-CM | POA: Diagnosis not present

## 2021-11-04 DIAGNOSIS — J9621 Acute and chronic respiratory failure with hypoxia: Secondary | ICD-10-CM | POA: Diagnosis not present

## 2021-11-04 DIAGNOSIS — J9601 Acute respiratory failure with hypoxia: Secondary | ICD-10-CM | POA: Diagnosis not present

## 2021-11-04 DIAGNOSIS — Z930 Tracheostomy status: Secondary | ICD-10-CM | POA: Diagnosis not present

## 2021-11-04 DIAGNOSIS — F419 Anxiety disorder, unspecified: Secondary | ICD-10-CM | POA: Diagnosis not present

## 2021-11-04 DIAGNOSIS — J159 Unspecified bacterial pneumonia: Secondary | ICD-10-CM | POA: Diagnosis not present

## 2021-11-04 DIAGNOSIS — Z4682 Encounter for fitting and adjustment of non-vascular catheter: Secondary | ICD-10-CM | POA: Diagnosis not present

## 2021-11-04 DIAGNOSIS — M6281 Muscle weakness (generalized): Secondary | ICD-10-CM | POA: Diagnosis not present

## 2021-11-04 DIAGNOSIS — D649 Anemia, unspecified: Secondary | ICD-10-CM | POA: Diagnosis not present

## 2021-11-04 DIAGNOSIS — J449 Chronic obstructive pulmonary disease, unspecified: Secondary | ICD-10-CM | POA: Diagnosis not present

## 2021-11-04 NOTE — Progress Notes (Addendum)
Pulmonary Critical Care Medicine ?[place] ?  ?PULMONARY CRITICAL CARE SERVICE ? ?PROGRESS NOTE ? ? ? ? ?Gina Petty  ?[id number]  ?DOB: 1946-01-26  ? ?DOA: 10/25/2021 ? ?Referring Physician: Satira Sark, MD ? ?HPI: Gina Petty is a 76 y.o. female being followed for ventilator/airway/oxygen weaning Acute on Chronic Respiratory Failure.  Patient seen sitting up in bed.  Currently on full support.  Does have a 16-hour pressure support goal today.  No acute distress, no acute overnight events. ? ?Medications: ?Reviewed on Rounds ? ?Physical Exam: ? ?Vitals: Temp 97.5, pulse 103, respirations 26, BP 100/51, SPO2 94% ? ?Ventilator Settings AC VC, rate 18, tidal volume 470, FiO2 20%, PEEP of 5 ? ?General: Comfortable at this time ?Neck: supple ?Cardiovascular: no malignant arrhythmias ?Respiratory: Bilaterally clear ?Skin: no rash seen on limited exam ?Musculoskeletal: No gross abnormality ?Psychiatric:unable to assess ?Neurologic:no involuntary movements   ?   ?   ?Lab Data:  ? ?Basic Metabolic Panel: ?Recent Labs  ?Lab 10/29/21 ?7035 10/30/21 ?0093 10/31/21 ?8182 11/01/21 ?0358  ?NA 137 140  --  140  ?K 3.7 5.2* 3.5 3.6  ?CL 100 103  --  103  ?CO2 32 30  --  31  ?GLUCOSE 123* 120*  --  109*  ?BUN 24* 23  --  24*  ?CREATININE 0.56 0.55  --  0.57  ?CALCIUM 8.6* 8.7*  --  8.7*  ?MG  --  2.0  --  1.9  ? ? ?ABG: ?No results for input(s): PHART, PCO2ART, PO2ART, HCO3, O2SAT in the last 168 hours. ? ?Liver Function Tests: ?Recent Labs  ?Lab 10/29/21 ?9937  ?AST 61*  ?ALT 166*  ?ALKPHOS 116  ?BILITOT 0.7  ?PROT 6.0*  ?ALBUMIN 2.2*  ? ?No results for input(s): LIPASE, AMYLASE in the last 168 hours. ?No results for input(s): AMMONIA in the last 168 hours. ? ?CBC: ?Recent Labs  ?Lab 10/29/21 ?1696 10/30/21 ?7893 11/01/21 ?0358  ?WBC 8.0 7.8 5.1  ?HGB 10.4* 10.3* 9.2*  ?HCT 33.9* 32.6* 29.5*  ?MCV 102.1* 100.0 100.3*  ?PLT 231 223 200  ? ? ?Cardiac Enzymes: ?No results for input(s): CKTOTAL, CKMB,  CKMBINDEX, TROPONINI in the last 168 hours. ? ?BNP (last 3 results) ?Recent Labs  ?  10/12/21 ?0245  ?BNP 265.8*  ? ? ?ProBNP (last 3 results) ?No results for input(s): PROBNP in the last 8760 hours. ? ?Radiological Exams: ?DG Abd Portable 1V ? ?Result Date: 11/04/2021 ?CLINICAL DATA:  Feeding tube placement EXAM: PORTABLE ABDOMEN - 1 VIEW COMPARISON:  None. FINDINGS: Nasoenteric feeding tube tip is seen within the proximal stomach just beyond the gastroesophageal junction. The visualized abdominal gas pattern is nonobstructive. No free intraperitoneal gas. Bibasilar atelectasis or infiltrate is present IMPRESSION: Nasoenteric feeding tube tip within the proximal body of the stomach. Electronically Signed   By: Fidela Salisbury M.D.   On: 11/04/2021 00:31   ? ?Assessment/Plan ?Active Problems: ?  CAP (community acquired pneumonia) ?  Acute on chronic respiratory failure with hypoxia (HCC) ?  Anxiety disorder ?  Tracheostomy status ([place]) ?  COPD, severe ([place]) ? ? ?Acute on chronic respiratory failure with hypoxia -patient has a 16-hour pressure support goal today.  Currently on full support.  Continue with pulmonary toilet. ?Community-acquired pneumonia- has been treated with antibiotics. ?Anxiety disorder -continue with supportive care and medical management ?Severe COPD-continue with medical management and make adjustments as clinically indicated. ?Tracheostomy- remains in place for ventilator support, continue with trach care per protocol. ? ? ?  I have personally seen and evaluated the patient, evaluated laboratory and imaging results, formulated the assessment and plan and placed orders. ?The Patient requires high complexity decision making with multiple systems involvement.  ?Rounds were done with the Respiratory Therapy Director and Staff therapists and discussed with nursing staff also. ? ?Allyne Gee, MD FCCP ?Pulmonary Critical Care Medicine ?Sleep Medicine  ?

## 2021-11-05 ENCOUNTER — Other Ambulatory Visit (HOSPITAL_COMMUNITY): Payer: Medicare Other

## 2021-11-05 DIAGNOSIS — J189 Pneumonia, unspecified organism: Secondary | ICD-10-CM | POA: Diagnosis not present

## 2021-11-05 DIAGNOSIS — J159 Unspecified bacterial pneumonia: Secondary | ICD-10-CM | POA: Diagnosis not present

## 2021-11-05 DIAGNOSIS — Z930 Tracheostomy status: Secondary | ICD-10-CM | POA: Diagnosis not present

## 2021-11-05 DIAGNOSIS — M6281 Muscle weakness (generalized): Secondary | ICD-10-CM | POA: Diagnosis not present

## 2021-11-05 DIAGNOSIS — J9601 Acute respiratory failure with hypoxia: Secondary | ICD-10-CM | POA: Diagnosis not present

## 2021-11-05 DIAGNOSIS — F419 Anxiety disorder, unspecified: Secondary | ICD-10-CM | POA: Diagnosis not present

## 2021-11-05 DIAGNOSIS — J9621 Acute and chronic respiratory failure with hypoxia: Secondary | ICD-10-CM | POA: Diagnosis not present

## 2021-11-05 DIAGNOSIS — D649 Anemia, unspecified: Secondary | ICD-10-CM | POA: Diagnosis not present

## 2021-11-05 DIAGNOSIS — J9690 Respiratory failure, unspecified, unspecified whether with hypoxia or hypercapnia: Secondary | ICD-10-CM | POA: Diagnosis not present

## 2021-11-05 DIAGNOSIS — J449 Chronic obstructive pulmonary disease, unspecified: Secondary | ICD-10-CM | POA: Diagnosis not present

## 2021-11-05 LAB — COMPREHENSIVE METABOLIC PANEL
ALT: 244 U/L — ABNORMAL HIGH (ref 0–44)
AST: 150 U/L — ABNORMAL HIGH (ref 15–41)
Albumin: 1.9 g/dL — ABNORMAL LOW (ref 3.5–5.0)
Alkaline Phosphatase: 133 U/L — ABNORMAL HIGH (ref 38–126)
Anion gap: 7 (ref 5–15)
BUN: 18 mg/dL (ref 8–23)
CO2: 33 mmol/L — ABNORMAL HIGH (ref 22–32)
Calcium: 8.7 mg/dL — ABNORMAL LOW (ref 8.9–10.3)
Chloride: 101 mmol/L (ref 98–111)
Creatinine, Ser: 0.68 mg/dL (ref 0.44–1.00)
GFR, Estimated: >60 mL/min (ref >60)
Glucose, Bld: 137 mg/dL — ABNORMAL HIGH (ref 70–99)
Potassium: 3.8 mmol/L (ref 3.5–5.1)
Sodium: 141 mmol/L (ref 135–145)
Total Bilirubin: 0.7 mg/dL (ref 0.3–1.2)
Total Protein: 6 g/dL — ABNORMAL LOW (ref 6.5–8.1)

## 2021-11-05 LAB — CBC
HCT: 27.5 % — ABNORMAL LOW (ref 36.0–46.0)
Hemoglobin: 8.3 g/dL — ABNORMAL LOW (ref 12.0–15.0)
MCH: 29.5 pg (ref 26.0–34.0)
MCHC: 30.2 g/dL (ref 30.0–36.0)
MCV: 97.9 fL (ref 80.0–100.0)
Platelets: 284 10*3/uL (ref 150–400)
RBC: 2.81 MIL/uL — ABNORMAL LOW (ref 3.87–5.11)
RDW: 14.2 % (ref 11.5–15.5)
WBC: 5.8 10*3/uL (ref 4.0–10.5)
nRBC: 0 % (ref 0.0–0.2)

## 2021-11-05 LAB — MAGNESIUM: Magnesium: 2 mg/dL (ref 1.7–2.4)

## 2021-11-05 NOTE — Progress Notes (Addendum)
Pulmonary Critical Care Medicine ?[place] ?  ?PULMONARY CRITICAL CARE SERVICE ? ?PROGRESS NOTE ? ? ? ? ?Gina Petty  ?[id number]  ?DOB: [birth year]  ? ?DOA: 10/25/2021 ? ?Referring Physician: Satira Sark, MD ? ?HPI: Gina Petty is a 76 y.o. female being followed for ventilator/airway/oxygen weaning Acute on Chronic Respiratory Failure.  Patient seen sitting up in bed.  Currently on full support.  Failed her  16-hour pressure support goal yesterday where she became tachypneic and has increased work of breathing.  According to nursing staff she spiked a fever overnight. No acute distress. ?  ? ?Medications: ?Reviewed on Rounds ? ?Physical Exam: ? ?Vitals: temp 99.7, pulse 90, respirations 21, BP 125/60, SP02 99% ? ?Ventilator Settings AC VC, rate 18, tidal volume 470, FiO2 20%, PEEP of 5 ? ?General: Comfortable at this time ?Neck: supple ?Cardiovascular: no malignant arrhythmias ?Respiratory: Bilaterally coarse with left wheeze on expiration ?Skin: no rash seen on limited exam ?Musculoskeletal: No gross abnormality ?Psychiatric:unable to assess ?Neurologic:no involuntary movements   ?   ?   ?Lab Data:  ? ?Basic Metabolic Panel: ?Recent Labs  ?Lab 10/30/21 ?0324 10/31/21 ?0355 11/01/21 ?0358 11/05/21 ?0441  ?NA 140  --  140 141  ?K 5.2* 3.5 3.6 3.8  ?CL 103  --  103 101  ?CO2 30  --  31 33*  ?GLUCOSE 120*  --  109* 137*  ?BUN 23  --  24* 18  ?CREATININE 0.55  --  0.57 0.68  ?CALCIUM 8.7*  --  8.7* 8.7*  ?MG 2.0  --  1.9 2.0  ? ? ?ABG: ?No results for input(s): PHART, PCO2ART, PO2ART, HCO3, O2SAT in the last 168 hours. ? ?Liver Function Tests: ?Recent Labs  ?Lab 11/05/21 ?0441  ?AST 150*  ?ALT 244*  ?ALKPHOS 133*  ?BILITOT 0.7  ?PROT 6.0*  ?ALBUMIN 1.9*  ? ?No results for input(s): LIPASE, AMYLASE in the last 168 hours. ?No results for input(s): AMMONIA in the last 168 hours. ? ?CBC: ?Recent Labs  ?Lab 10/30/21 ?0324 11/01/21 ?0358 11/05/21 ?0441  ?WBC 7.8 5.1 5.8  ?HGB 10.3* 9.2* 8.3*  ?HCT  32.6* 29.5* 27.5*  ?MCV 100.0 100.3* 97.9  ?PLT 223 200 284  ? ? ?Cardiac Enzymes: ?No results for input(s): CKTOTAL, CKMB, CKMBINDEX, TROPONINI in the last 168 hours. ? ?BNP (last 3 results) ?Recent Labs  ?  10/12/21 ?0245  ?BNP 265.8*  ? ? ?ProBNP (last 3 results) ?No results for input(s): PROBNP in the last 8760 hours. ? ?Radiological Exams: ?DG Abd Portable 1V ? ?Result Date: 11/04/2021 ?CLINICAL DATA:  Feeding tube placement EXAM: PORTABLE ABDOMEN - 1 VIEW COMPARISON:  None. FINDINGS: Nasoenteric feeding tube tip is seen within the proximal stomach just beyond the gastroesophageal junction. The visualized abdominal gas pattern is nonobstructive. No free intraperitoneal gas. Bibasilar atelectasis or infiltrate is present IMPRESSION: Nasoenteric feeding tube tip within the proximal body of the stomach. Electronically Signed   By: Fidela Salisbury M.D.   On: 11/04/2021 00:31   ? ?Assessment/Plan ?Active Problems: ?  CAP (community acquired pneumonia) ?  Acute on chronic respiratory failure with hypoxia (HCC) ?  Anxiety disorder ?  Tracheostomy status ([place]) ?  COPD, severe ([place]) ? ?Acute on chronic respiratory failure with hypoxia -patient failed her 16-hour pressure support goal yesterday  Currently on full support due to tachypnea and increased work of breathing with overnight fever. Continue with pulmonary toilet. ?Community-acquired pneumonia- has been treated with antibiotics. ?Anxiety disorder -continue with supportive  care and medical management ?Severe COPD-continue with medical management and make adjustments as clinically indicated. ?Tracheostomy- remains in place for ventilator support, continue with trach care per protocol. ? ? ?I have personally seen and evaluated the patient, evaluated laboratory and imaging results, formulated the assessment and plan and placed orders. ?The Patient requires high complexity decision making with multiple systems involvement.  ?Rounds were done with the Respiratory Therapy  Director and Staff therapists and discussed with nursing staff also. ? ?Allyne Gee, MD FCCP ?Pulmonary Critical Care Medicine ?Sleep Medicine  ?

## 2021-11-06 DIAGNOSIS — J9621 Acute and chronic respiratory failure with hypoxia: Secondary | ICD-10-CM | POA: Diagnosis not present

## 2021-11-06 DIAGNOSIS — D649 Anemia, unspecified: Secondary | ICD-10-CM | POA: Diagnosis not present

## 2021-11-06 DIAGNOSIS — M6281 Muscle weakness (generalized): Secondary | ICD-10-CM | POA: Diagnosis not present

## 2021-11-06 DIAGNOSIS — Z930 Tracheostomy status: Secondary | ICD-10-CM | POA: Diagnosis not present

## 2021-11-06 DIAGNOSIS — J9601 Acute respiratory failure with hypoxia: Secondary | ICD-10-CM | POA: Diagnosis not present

## 2021-11-06 DIAGNOSIS — J189 Pneumonia, unspecified organism: Secondary | ICD-10-CM | POA: Diagnosis not present

## 2021-11-06 DIAGNOSIS — F419 Anxiety disorder, unspecified: Secondary | ICD-10-CM | POA: Diagnosis not present

## 2021-11-06 DIAGNOSIS — J449 Chronic obstructive pulmonary disease, unspecified: Secondary | ICD-10-CM | POA: Diagnosis not present

## 2021-11-06 DIAGNOSIS — J159 Unspecified bacterial pneumonia: Secondary | ICD-10-CM | POA: Diagnosis not present

## 2021-11-06 NOTE — Progress Notes (Addendum)
Pulmonary Critical Care Medicine ?[place] ?  ?PULMONARY CRITICAL CARE SERVICE ? ?PROGRESS NOTE ? ? ? ? ?Andee Poles Glogowski  ?[id number]  ?DOB: 28-Feb-1946  ? ?DOA: 10/25/2021 ? ?Referring Physician: Satira Sark, MD ? ?HPI: Gina Petty is a 76 y.o. female being followed for ventilator/airway/oxygen weaning Acute on Chronic Respiratory Failure.  Patient seen lying in bed, currently remains on full support.  Patient continues to have low-grade fever, we will rest her 1 more day without ventilation weaning attempt.  No acute distress, no acute overnight events. ? ?Medications: ?Reviewed on Rounds ? ?Physical Exam: ? ?Vitals: Temp 99.0, pulse 100, respirations 31, BP 195/60, SPO2 95% ? ?Ventilator Settings AC VC, rate 18, tidal volume 470, FiO2 20%, PEEP of 5 ? ?General: Comfortable at this time ?Neck: supple ?Cardiovascular: no malignant arrhythmias ?Respiratory: Bilaterally diminished ?Skin: no rash seen on limited exam ?Musculoskeletal: No gross abnormality ?Psychiatric:unable to assess ?Neurologic:no involuntary movements   ?   ?   ?Lab Data:  ? ?Basic Metabolic Panel: ?Recent Labs  ?Lab 10/31/21 ?6269 11/01/21 ?0358 11/05/21 ?0441  ?NA  --  140 141  ?K 3.5 3.6 3.8  ?CL  --  103 101  ?CO2  --  31 33*  ?GLUCOSE  --  109* 137*  ?BUN  --  24* 18  ?CREATININE  --  0.57 0.68  ?CALCIUM  --  8.7* 8.7*  ?MG  --  1.9 2.0  ? ? ?ABG: ?No results for input(s): PHART, PCO2ART, PO2ART, HCO3, O2SAT in the last 168 hours. ? ?Liver Function Tests: ?Recent Labs  ?Lab 11/05/21 ?0441  ?AST 150*  ?ALT 244*  ?ALKPHOS 133*  ?BILITOT 0.7  ?PROT 6.0*  ?ALBUMIN 1.9*  ? ?No results for input(s): LIPASE, AMYLASE in the last 168 hours. ?No results for input(s): AMMONIA in the last 168 hours. ? ?CBC: ?Recent Labs  ?Lab 11/01/21 ?4854 11/05/21 ?0441  ?WBC 5.1 5.8  ?HGB 9.2* 8.3*  ?HCT 29.5* 27.5*  ?MCV 100.3* 97.9  ?PLT 200 284  ? ? ?Cardiac Enzymes: ?No results for input(s): CKTOTAL, CKMB, CKMBINDEX, TROPONINI in the last 168  hours. ? ?BNP (last 3 results) ?Recent Labs  ?  10/12/21 ?0245  ?BNP 265.8*  ? ? ?ProBNP (last 3 results) ?No results for input(s): PROBNP in the last 8760 hours. ? ?Radiological Exams: ?DG Chest Port 1 View ? ?Result Date: 11/05/2021 ?CLINICAL DATA:  Pneumonia, respiratory failure EXAM: PORTABLE CHEST 1 VIEW COMPARISON:  10/26/2021 FINDINGS: Heart is normal size. Patchy bilateral lower lobe airspace opacities are similar prior study. No effusions. No acute bony abnormality. Tracheostomy tube remains in place, unchanged. IMPRESSION: Bibasilar airspace opacities could reflect atelectasis or pneumonia. Findings similar to prior study. Electronically Signed   By: Rolm Baptise M.D.   On: 11/05/2021 19:49   ? ?Assessment/Plan ?Active Problems: ?  CAP (community acquired pneumonia) ?  Acute on chronic respiratory failure with hypoxia (HCC) ?  Anxiety disorder ?  Tracheostomy status ([place]) ?  COPD, severe ([place]) ? ?Acute on chronic respiratory failure with hypoxia -patient failed her 16-hour pressure support goal Thursday.  Currently remains on full support.  We will rest her 1 more day without weaning attempt as she continues to have low-grade fevers.. Continue with pulmonary toilet. ?Community-acquired pneumonia- has been treated with antibiotics. ?Anxiety disorder -continue with supportive care and medical management ?Severe COPD-continue with medical management and make adjustments as clinically indicated. ?Tracheostomy- remains in place for ventilator support, continue with trach care per protocol. ? ? ?  I have personally seen and evaluated the patient, evaluated laboratory and imaging results, formulated the assessment and plan and placed orders. ?The Patient requires high complexity decision making with multiple systems involvement.  ?Rounds were done with the Respiratory Therapy Director and Staff therapists and discussed with nursing staff also. ? ?Allyne Gee, MD FCCP ?Pulmonary Critical Care Medicine ?Sleep  Medicine  ?

## 2021-11-07 DIAGNOSIS — D649 Anemia, unspecified: Secondary | ICD-10-CM | POA: Diagnosis not present

## 2021-11-07 DIAGNOSIS — J449 Chronic obstructive pulmonary disease, unspecified: Secondary | ICD-10-CM | POA: Diagnosis not present

## 2021-11-07 DIAGNOSIS — J9621 Acute and chronic respiratory failure with hypoxia: Secondary | ICD-10-CM | POA: Diagnosis not present

## 2021-11-07 DIAGNOSIS — F419 Anxiety disorder, unspecified: Secondary | ICD-10-CM | POA: Diagnosis not present

## 2021-11-07 DIAGNOSIS — J9601 Acute respiratory failure with hypoxia: Secondary | ICD-10-CM | POA: Diagnosis not present

## 2021-11-07 DIAGNOSIS — M6281 Muscle weakness (generalized): Secondary | ICD-10-CM | POA: Diagnosis not present

## 2021-11-07 DIAGNOSIS — J189 Pneumonia, unspecified organism: Secondary | ICD-10-CM | POA: Diagnosis not present

## 2021-11-07 DIAGNOSIS — J159 Unspecified bacterial pneumonia: Secondary | ICD-10-CM | POA: Diagnosis not present

## 2021-11-07 DIAGNOSIS — Z930 Tracheostomy status: Secondary | ICD-10-CM | POA: Diagnosis not present

## 2021-11-07 LAB — CULTURE, BLOOD (ROUTINE X 2)
Culture: NO GROWTH
Culture: NO GROWTH
Special Requests: ADEQUATE
Special Requests: ADEQUATE

## 2021-11-07 NOTE — Progress Notes (Addendum)
Pulmonary Critical Care Medicine ?[place] ?  ?PULMONARY CRITICAL CARE SERVICE ? ?PROGRESS NOTE ? ? ? ? ?Gina Petty  ?[id number]:[id number]  ?DOB: 17-Jun-1946  ? ?DOA: 10/25/2021 ? ?Referring Physician: Satira Sark, MD ? ?HPI: Gina Petty is a 76 y.o. female being followed for ventilator/airway/oxygen weaning Acute on Chronic Respiratory Failure.  Patient seen lying in bed.  Currently on full support.  She did complete 6 hours of pressure support yesterday.  We will continue with pressure support goal of 16 hours today.  No acute distress, no acute overnight events. ? ?Medications: ?Reviewed on Rounds ? ?Physical Exam: ? ?Vitals: Temp 99.6, pulse 88, respirations 23, BP 115/64, SPO2 96% ? ?Ventilator Settings AC VC, FiO2 28%, rate 18, tidal volume 470, PEEP 5 ? ?General: Comfortable at this time ?Neck: supple ?Cardiovascular: no malignant arrhythmias ?Respiratory: Left lung clear, right lung with expiratory wheeze ?Skin: no rash seen on limited exam ?Musculoskeletal: No gross abnormality ?Psychiatric:unable to assess ?Neurologic:no involuntary movements   ?   ?   ?Lab Data:  ? ?Basic Metabolic Panel: ?Recent Labs  ?Lab 11/01/21 ?8329 11/05/21 ?0441  ?NA 140 141  ?K 3.6 3.8  ?CL 103 101  ?CO2 31 33*  ?GLUCOSE 109* 137*  ?BUN 24* 18  ?CREATININE 0.57 0.68  ?CALCIUM 8.7* 8.7*  ?MG 1.9 2.0  ? ? ?ABG: ?No results for input(s): PHART, PCO2ART, PO2ART, HCO3, O2SAT in the last 168 hours. ? ?Liver Function Tests: ?Recent Labs  ?Lab 11/05/21 ?0441  ?AST 150*  ?ALT 244*  ?ALKPHOS 133*  ?BILITOT 0.7  ?PROT 6.0*  ?ALBUMIN 1.9*  ? ?No results for input(s): LIPASE, AMYLASE in the last 168 hours. ?No results for input(s): AMMONIA in the last 168 hours. ? ?CBC: ?Recent Labs  ?Lab 11/01/21 ?1916 11/05/21 ?0441  ?WBC 5.1 5.8  ?HGB 9.2* 8.3*  ?HCT 29.5* 27.5*  ?MCV 100.3* 97.9  ?PLT 200 284  ? ? ?Cardiac Enzymes: ?No results for input(s): CKTOTAL, CKMB, CKMBINDEX, TROPONINI in the last 168 hours. ? ?BNP (last 3  results) ?Recent Labs  ?  10/12/21 ?0245  ?BNP 265.8*  ? ? ?ProBNP (last 3 results) ?No results for input(s): PROBNP in the last 8760 hours. ? ?Radiological Exams: ?DG Chest Port 1 View ? ?Result Date: 11/05/2021 ?CLINICAL DATA:  Pneumonia, respiratory failure EXAM: PORTABLE CHEST 1 VIEW COMPARISON:  10/26/2021 FINDINGS: Heart is normal size. Patchy bilateral lower lobe airspace opacities are similar prior study. No effusions. No acute bony abnormality. Tracheostomy tube remains in place, unchanged. IMPRESSION: Bibasilar airspace opacities could reflect atelectasis or pneumonia. Findings similar to prior study. Electronically Signed   By: Rolm Baptise M.D.   On: 11/05/2021 19:49   ? ?Assessment/Plan ?Active Problems: ?  CAP (community acquired pneumonia) ?  Acute on chronic respiratory failure with hypoxia (HCC) ?  Anxiety disorder ?  Tracheostomy status ([place]) ?  COPD, severe ([place]) ? ?Acute on chronic respiratory failure with hypoxia -patient did complete 6 hours of pressure support yesterday.  We will continue with a pressure support goal of 16 hours.  Continue with pulmonary toilet. ?Community-acquired pneumonia- has been treated with antibiotics. ?Anxiety disorder -continue with supportive care and medical management ?Severe COPD-continue with medical management and make adjustments as clinically indicated. ?Tracheostomy- remains in place for ventilator support, continue with trach care per protocol. ? ? ?I have personally seen and evaluated the patient, evaluated laboratory and imaging results, formulated the assessment and plan and placed orders. ?The Patient requires high complexity  decision making with multiple systems involvement.  ?Rounds were done with the Respiratory Therapy Director and Staff therapists and discussed with nursing staff also. ? ?Allyne Gee, MD FCCP ?Pulmonary Critical Care Medicine ?Sleep Medicine  ?

## 2021-11-08 DIAGNOSIS — M6281 Muscle weakness (generalized): Secondary | ICD-10-CM | POA: Diagnosis not present

## 2021-11-08 DIAGNOSIS — Z930 Tracheostomy status: Secondary | ICD-10-CM | POA: Diagnosis not present

## 2021-11-08 DIAGNOSIS — J9621 Acute and chronic respiratory failure with hypoxia: Secondary | ICD-10-CM | POA: Diagnosis not present

## 2021-11-08 DIAGNOSIS — J159 Unspecified bacterial pneumonia: Secondary | ICD-10-CM | POA: Diagnosis not present

## 2021-11-08 DIAGNOSIS — J189 Pneumonia, unspecified organism: Secondary | ICD-10-CM | POA: Diagnosis not present

## 2021-11-08 DIAGNOSIS — D649 Anemia, unspecified: Secondary | ICD-10-CM | POA: Diagnosis not present

## 2021-11-08 DIAGNOSIS — J449 Chronic obstructive pulmonary disease, unspecified: Secondary | ICD-10-CM | POA: Diagnosis not present

## 2021-11-08 DIAGNOSIS — J9601 Acute respiratory failure with hypoxia: Secondary | ICD-10-CM | POA: Diagnosis not present

## 2021-11-08 DIAGNOSIS — F419 Anxiety disorder, unspecified: Secondary | ICD-10-CM | POA: Diagnosis not present

## 2021-11-09 DIAGNOSIS — J449 Chronic obstructive pulmonary disease, unspecified: Secondary | ICD-10-CM | POA: Diagnosis not present

## 2021-11-09 DIAGNOSIS — J189 Pneumonia, unspecified organism: Secondary | ICD-10-CM | POA: Diagnosis not present

## 2021-11-09 DIAGNOSIS — Z930 Tracheostomy status: Secondary | ICD-10-CM | POA: Diagnosis not present

## 2021-11-09 DIAGNOSIS — D649 Anemia, unspecified: Secondary | ICD-10-CM | POA: Diagnosis not present

## 2021-11-09 DIAGNOSIS — J9621 Acute and chronic respiratory failure with hypoxia: Secondary | ICD-10-CM | POA: Diagnosis not present

## 2021-11-09 DIAGNOSIS — M6281 Muscle weakness (generalized): Secondary | ICD-10-CM | POA: Diagnosis not present

## 2021-11-09 DIAGNOSIS — F419 Anxiety disorder, unspecified: Secondary | ICD-10-CM | POA: Diagnosis not present

## 2021-11-09 DIAGNOSIS — J159 Unspecified bacterial pneumonia: Secondary | ICD-10-CM | POA: Diagnosis not present

## 2021-11-09 DIAGNOSIS — J9601 Acute respiratory failure with hypoxia: Secondary | ICD-10-CM | POA: Diagnosis not present

## 2021-11-09 LAB — COMPREHENSIVE METABOLIC PANEL
ALT: 311 U/L — ABNORMAL HIGH (ref 0–44)
AST: 116 U/L — ABNORMAL HIGH (ref 15–41)
Albumin: 1.8 g/dL — ABNORMAL LOW (ref 3.5–5.0)
Alkaline Phosphatase: 180 U/L — ABNORMAL HIGH (ref 38–126)
Anion gap: 7 (ref 5–15)
BUN: 25 mg/dL — ABNORMAL HIGH (ref 8–23)
CO2: 34 mmol/L — ABNORMAL HIGH (ref 22–32)
Calcium: 8.6 mg/dL — ABNORMAL LOW (ref 8.9–10.3)
Chloride: 95 mmol/L — ABNORMAL LOW (ref 98–111)
Creatinine, Ser: 0.62 mg/dL (ref 0.44–1.00)
GFR, Estimated: >60 mL/min (ref >60)
Glucose, Bld: 116 mg/dL — ABNORMAL HIGH (ref 70–99)
Potassium: 3.4 mmol/L — ABNORMAL LOW (ref 3.5–5.1)
Sodium: 136 mmol/L (ref 135–145)
Total Bilirubin: 0.4 mg/dL (ref 0.3–1.2)
Total Protein: 6.2 g/dL — ABNORMAL LOW (ref 6.5–8.1)

## 2021-11-09 LAB — CBC
HCT: 26.5 % — ABNORMAL LOW (ref 36.0–46.0)
Hemoglobin: 8.5 g/dL — ABNORMAL LOW (ref 12.0–15.0)
MCH: 30.4 pg (ref 26.0–34.0)
MCHC: 32.1 g/dL (ref 30.0–36.0)
MCV: 94.6 fL (ref 80.0–100.0)
Platelets: 326 10*3/uL (ref 150–400)
RBC: 2.8 MIL/uL — ABNORMAL LOW (ref 3.87–5.11)
RDW: 14.6 % (ref 11.5–15.5)
WBC: 10.9 10*3/uL — ABNORMAL HIGH (ref 4.0–10.5)
nRBC: 0 % (ref 0.0–0.2)

## 2021-11-09 NOTE — Progress Notes (Addendum)
Pulmonary Critical Care Medicine ?[place] ?  ?PULMONARY CRITICAL CARE SERVICE ? ?PROGRESS NOTE ? ? ? ? ?Gina Petty  ?[id number]  ?DOB: [birth year]  ? ?DOA: 10/25/2021 ? ?Referring Physician: Satira Sark, MD ? ?HPI: Gina Petty is a 76 y.o. female being followed for ventilator/airway/oxygen weaning Acute on Chronic Respiratory Failure.  Patient seen lying in bed currently on pressure support 12/5 FiO2 20%.  She did complete her 4-hour goal of ATC yesterday.  No acute distress, no acute overnight events. ? ?Medications: ?Reviewed on Rounds ? ?Physical Exam: ? ?Vitals: Temp 100.6, pulse 95, respirations 20, BP 102/61, SPO2 94% ? ?Ventilator Settings support 12/5 FiO2 20% ? ?General: Comfortable at this time ?Neck: supple ?Cardiovascular: no malignant arrhythmias ?Respiratory: Bilaterally clear but diminished ?Skin: no rash seen on limited exam ?Musculoskeletal: No gross abnormality ?Psychiatric:unable to assess ?Neurologic:no involuntary movements   ?   ?   ?Lab Data:  ? ?Basic Metabolic Panel: ?Recent Labs  ?Lab 11/05/21 ?0441 11/09/21 ?0426  ?NA 141 136  ?K 3.8 3.4*  ?CL 101 95*  ?CO2 33* 34*  ?GLUCOSE 137* 116*  ?BUN 18 25*  ?CREATININE 0.68 0.62  ?CALCIUM 8.7* 8.6*  ?MG 2.0  --   ? ? ?ABG: ?No results for input(s): PHART, PCO2ART, PO2ART, HCO3, O2SAT in the last 168 hours. ? ?Liver Function Tests: ?Recent Labs  ?Lab 11/05/21 ?0441 11/09/21 ?0426  ?AST 150* 116*  ?ALT 244* 311*  ?ALKPHOS 133* 180*  ?BILITOT 0.7 0.4  ?PROT 6.0* 6.2*  ?ALBUMIN 1.9* 1.8*  ? ?No results for input(s): LIPASE, AMYLASE in the last 168 hours. ?No results for input(s): AMMONIA in the last 168 hours. ? ?CBC: ?Recent Labs  ?Lab 11/05/21 ?0441 11/09/21 ?0426  ?WBC 5.8 10.9*  ?HGB 8.3* 8.5*  ?HCT 27.5* 26.5*  ?MCV 97.9 94.6  ?PLT 284 326  ? ? ?Cardiac Enzymes: ?No results for input(s): CKTOTAL, CKMB, CKMBINDEX, TROPONINI in the last 168 hours. ? ?BNP (last 3 results) ?Recent Labs  ?  10/12/21 ?0245  ?BNP 265.8*   ? ? ?ProBNP (last 3 results) ?No results for input(s): PROBNP in the last 8760 hours. ? ?Radiological Exams: ?No results found. ? ?Assessment/Plan ?Active Problems: ?  CAP (community acquired pneumonia) ?  Acute on chronic respiratory failure with hypoxia (HCC) ?  Anxiety disorder ?  Tracheostomy status ([place]) ?  COPD, severe ([place]) ? ?Acute on chronic respiratory failure with hypoxia -patient did complete 2 hours of ATC yesterday.  4-hour goal of ATC today.  Continue weaning per protocol.  Continue with pulmonary toilet. ?Community-acquired pneumonia- has been treated with antibiotics. ?Anxiety disorder -continue with supportive care and medical management ?Severe COPD-continue with medical management and make adjustments as clinically indicated. ?Tracheostomy- remains in place for ventilator support, continue with trach care per protocol. ? ? ?I have personally seen and evaluated the patient, evaluated laboratory and imaging results, formulated the assessment and plan and placed orders. ?The Patient requires high complexity decision making with multiple systems involvement.  ?Rounds were done with the Respiratory Therapy Director and Staff therapists and discussed with nursing staff also. ? ?Allyne Gee, MD FCCP ?Pulmonary Critical Care Medicine ?Sleep Medicine  ?

## 2021-11-09 NOTE — Progress Notes (Addendum)
Pulmonary Critical Care Medicine ?[place] ?  ?PULMONARY CRITICAL CARE SERVICE ? ?PROGRESS NOTE ? ? ? ? ?Gina Petty  ?[id number]  ?DOB: 09/29/1945  ? ?DOA: 10/25/2021 ? ?Referring Physician: Satira Sark, MD ? ?HPI: Gina Petty is a 76 y.o. female being followed for ventilator/airway/oxygen weaning Acute on Chronic Respiratory Failure.  Patient seen lying in bed, currently on pressure support collar 5 FiO2 28%.  She completed 2 hours of ATC yesterday.  She has a goal of 4 hours ATC today and 12 hours of pressure support. ? ?Medications: ?Reviewed on Rounds ? ?Physical Exam: ? ?Vitals: Temp 100.3, pulse 94, respirations 26, BP 114/60, SPO2 95% ? ?Ventilator Settings are support 12/5 FiO2 28% ? ?General: Comfortable at this time ?Neck: supple ?Cardiovascular: no malignant arrhythmias ?Respiratory: Bilaterally clear ?Skin: no rash seen on limited exam ?Musculoskeletal: No gross abnormality ?Psychiatric:unable to assess ?Neurologic:no involuntary movements   ?   ?   ?Lab Data:  ? ?Basic Metabolic Panel: ?Recent Labs  ?Lab 11/05/21 ?0441 11/09/21 ?0426  ?NA 141 136  ?K 3.8 3.4*  ?CL 101 95*  ?CO2 33* 34*  ?GLUCOSE 137* 116*  ?BUN 18 25*  ?CREATININE 0.68 0.62  ?CALCIUM 8.7* 8.6*  ?MG 2.0  --   ? ? ?ABG: ?No results for input(s): PHART, PCO2ART, PO2ART, HCO3, O2SAT in the last 168 hours. ? ?Liver Function Tests: ?Recent Labs  ?Lab 11/05/21 ?0441 11/09/21 ?0426  ?AST 150* 116*  ?ALT 244* 311*  ?ALKPHOS 133* 180*  ?BILITOT 0.7 0.4  ?PROT 6.0* 6.2*  ?ALBUMIN 1.9* 1.8*  ? ?No results for input(s): LIPASE, AMYLASE in the last 168 hours. ?No results for input(s): AMMONIA in the last 168 hours. ? ?CBC: ?Recent Labs  ?Lab 11/05/21 ?0441 11/09/21 ?0426  ?WBC 5.8 10.9*  ?HGB 8.3* 8.5*  ?HCT 27.5* 26.5*  ?MCV 97.9 94.6  ?PLT 284 326  ? ? ?Cardiac Enzymes: ?No results for input(s): CKTOTAL, CKMB, CKMBINDEX, TROPONINI in the last 168 hours. ? ?BNP (last 3 results) ?Recent Labs  ?  10/12/21 ?0245  ?BNP 265.8*   ? ? ?ProBNP (last 3 results) ?No results for input(s): PROBNP in the last 8760 hours. ? ?Radiological Exams: ?No results found. ? ?Assessment/Plan ?Active Problems: ?  CAP (community acquired pneumonia) ?  Acute on chronic respiratory failure with hypoxia (HCC) ?  Anxiety disorder ?  Tracheostomy status ([place]) ?  COPD, severe ([place]) ? ?Acute on chronic respiratory failure with hypoxia -patient did complete 2 hours of ATC yesterday.  4-hour goal of ATC today and 12 hours of pressure support.  Continue weaning per protocol.  Continue with pulmonary toilet. ?Community-acquired pneumonia- has been treated with antibiotics. ?Anxiety disorder -continue with supportive care and medical management ?Severe COPD-continue with medical management and make adjustments as clinically indicated.  We have reordered Pulmicort twice daily today. ?Tracheostomy- remains in place for ventilator support, continue with trach care per protocol. ? ? ?I have personally seen and evaluated the patient, evaluated laboratory and imaging results, formulated the assessment and plan and placed orders. ?The Patient requires high complexity decision making with multiple systems involvement.  ?Rounds were done with the Respiratory Therapy Director and Staff therapists and discussed with nursing staff also. ? ?Allyne Gee, MD FCCP ?Pulmonary Critical Care Medicine ?Sleep Medicine  ?

## 2021-11-10 ENCOUNTER — Other Ambulatory Visit (HOSPITAL_COMMUNITY): Payer: Medicare Other

## 2021-11-10 DIAGNOSIS — J189 Pneumonia, unspecified organism: Secondary | ICD-10-CM | POA: Diagnosis not present

## 2021-11-10 DIAGNOSIS — J9621 Acute and chronic respiratory failure with hypoxia: Secondary | ICD-10-CM | POA: Diagnosis not present

## 2021-11-10 DIAGNOSIS — J449 Chronic obstructive pulmonary disease, unspecified: Secondary | ICD-10-CM | POA: Diagnosis not present

## 2021-11-10 DIAGNOSIS — Z8701 Personal history of pneumonia (recurrent): Secondary | ICD-10-CM | POA: Diagnosis not present

## 2021-11-10 DIAGNOSIS — J984 Other disorders of lung: Secondary | ICD-10-CM | POA: Diagnosis not present

## 2021-11-10 DIAGNOSIS — Z930 Tracheostomy status: Secondary | ICD-10-CM | POA: Diagnosis not present

## 2021-11-10 DIAGNOSIS — F419 Anxiety disorder, unspecified: Secondary | ICD-10-CM | POA: Diagnosis not present

## 2021-11-10 LAB — URINALYSIS, ROUTINE W REFLEX MICROSCOPIC
Bilirubin Urine: NEGATIVE
Glucose, UA: NEGATIVE mg/dL
Hgb urine dipstick: NEGATIVE
Ketones, ur: NEGATIVE mg/dL
Leukocytes,Ua: NEGATIVE
Nitrite: NEGATIVE
Protein, ur: NEGATIVE mg/dL
Specific Gravity, Urine: 1.012 (ref 1.005–1.030)
pH: 7 (ref 5.0–8.0)

## 2021-11-10 LAB — POTASSIUM: Potassium: 3.5 mmol/L (ref 3.5–5.1)

## 2021-11-10 NOTE — Progress Notes (Addendum)
Pulmonary Critical Care Medicine ?[place] ?  ?PULMONARY CRITICAL CARE SERVICE ? ?PROGRESS NOTE ? ? ? ? ?Gina Petty  ?[id number]  ?DOB: [birth year]  ? ?DOA: 10/25/2021 ? ?Referring Physician: Satira Sark, MD ? ?HPI: Gina Petty is a 76 y.o. female being followed for ventilator/airway/oxygen weaning Acute on Chronic Respiratory Failure. Patient seen lying in bed, currently on pressure support collar 5 FiO2 28%.  She completed 4 hours of ATC yesterday.  She has a goal of  4 hours ATC today and 8 hours of pressure support. No acute distress, no acute overnight events. ?  ? ?Medications: ?Reviewed on Rounds ? ?Physical Exam: ? ?Vitals: Temp 99.3 pulse 92, respirations 21, BP 146/69, SPO2 92% ?  ? ?Ventilator Settings Pressure support 12/5 28% ? ?General: Comfortable at this time ?Neck: supple ?Cardiovascular: no malignant arrhythmias ?Respiratory: Bilaterally coarse ?Skin: no rash seen on limited exam ?Musculoskeletal: No gross abnormality ?Psychiatric:unable to assess ?Neurologic:no involuntary movements   ?   ?   ?Lab Data:  ? ?Basic Metabolic Panel: ?Recent Labs  ?Lab 11/05/21 ?0441 11/09/21 ?0426 11/10/21 ?6384  ?NA 141 136  --   ?K 3.8 3.4* 3.5  ?CL 101 95*  --   ?CO2 33* 34*  --   ?GLUCOSE 137* 116*  --   ?BUN 18 25*  --   ?CREATININE 0.68 0.62  --   ?CALCIUM 8.7* 8.6*  --   ?MG 2.0  --   --   ? ? ?ABG: ?No results for input(s): PHART, PCO2ART, PO2ART, HCO3, O2SAT in the last 168 hours. ? ?Liver Function Tests: ?Recent Labs  ?Lab 11/05/21 ?0441 11/09/21 ?0426  ?AST 150* 116*  ?ALT 244* 311*  ?ALKPHOS 133* 180*  ?BILITOT 0.7 0.4  ?PROT 6.0* 6.2*  ?ALBUMIN 1.9* 1.8*  ? ?No results for input(s): LIPASE, AMYLASE in the last 168 hours. ?No results for input(s): AMMONIA in the last 168 hours. ? ?CBC: ?Recent Labs  ?Lab 11/05/21 ?0441 11/09/21 ?0426  ?WBC 5.8 10.9*  ?HGB 8.3* 8.5*  ?HCT 27.5* 26.5*  ?MCV 97.9 94.6  ?PLT 284 326  ? ? ?Cardiac Enzymes: ?No results for input(s): CKTOTAL, CKMB,  CKMBINDEX, TROPONINI in the last 168 hours. ? ?BNP (last 3 results) ?Recent Labs  ?  10/12/21 ?0245  ?BNP 265.8*  ? ? ?ProBNP (last 3 results) ?No results for input(s): PROBNP in the last 8760 hours. ? ?Radiological Exams: ?No results found. ? ?Assessment/Plan ?Active Problems: ?  CAP (community acquired pneumonia) ?  Acute on chronic respiratory failure with hypoxia (HCC) ?  Anxiety disorder ?  Tracheostomy status ([place]) ?  COPD, severe ([place]) ? ?Acute on chronic respiratory failure with hypoxia -patient did complete 4 hours of ATC yesterday.  8-hour goal of ATC today and 8 hours of pressure support.  Continue weaning per protocol.  Continue with pulmonary toilet. ?Community-acquired pneumonia- has been treated with antibiotics. ?Anxiety disorder -continue with supportive care and medical management ?Severe COPD-continue with medical management and make adjustments as clinically indicated.  Pulmicort twice daily has been placed back on patient tx plan. ?Tracheostomy- remains in place for ventilator support, continue with trach care per protocol. ? ? ?I have personally seen and evaluated the patient, evaluated laboratory and imaging results, formulated the assessment and plan and placed orders. ?The Patient requires high complexity decision making with multiple systems involvement.  ?Rounds were done with the Respiratory Therapy Director and Staff therapists and discussed with nursing staff also. ? ?Allyne Gee, MD  FCCP ?Pulmonary Critical Care Medicine ?Sleep Medicine  ?

## 2021-11-11 DIAGNOSIS — F419 Anxiety disorder, unspecified: Secondary | ICD-10-CM | POA: Diagnosis not present

## 2021-11-11 DIAGNOSIS — J449 Chronic obstructive pulmonary disease, unspecified: Secondary | ICD-10-CM | POA: Diagnosis not present

## 2021-11-11 DIAGNOSIS — Z930 Tracheostomy status: Secondary | ICD-10-CM | POA: Diagnosis not present

## 2021-11-11 DIAGNOSIS — J189 Pneumonia, unspecified organism: Secondary | ICD-10-CM | POA: Diagnosis not present

## 2021-11-11 DIAGNOSIS — J9621 Acute and chronic respiratory failure with hypoxia: Secondary | ICD-10-CM | POA: Diagnosis not present

## 2021-11-11 LAB — CBC
HCT: 28.4 % — ABNORMAL LOW (ref 36.0–46.0)
Hemoglobin: 8.6 g/dL — ABNORMAL LOW (ref 12.0–15.0)
MCH: 28.9 pg (ref 26.0–34.0)
MCHC: 30.3 g/dL (ref 30.0–36.0)
MCV: 95.3 fL (ref 80.0–100.0)
Platelets: 350 10*3/uL (ref 150–400)
RBC: 2.98 MIL/uL — ABNORMAL LOW (ref 3.87–5.11)
RDW: 14.6 % (ref 11.5–15.5)
WBC: 6.5 10*3/uL (ref 4.0–10.5)
nRBC: 0 % (ref 0.0–0.2)

## 2021-11-11 LAB — FUNGUS CULTURE WITH STAIN

## 2021-11-11 LAB — URINE CULTURE

## 2021-11-11 LAB — COMPREHENSIVE METABOLIC PANEL
ALT: 344 U/L — ABNORMAL HIGH (ref 0–44)
AST: 169 U/L — ABNORMAL HIGH (ref 15–41)
Albumin: 1.6 g/dL — ABNORMAL LOW (ref 3.5–5.0)
Alkaline Phosphatase: 203 U/L — ABNORMAL HIGH (ref 38–126)
Anion gap: 8 (ref 5–15)
BUN: 23 mg/dL (ref 8–23)
CO2: 35 mmol/L — ABNORMAL HIGH (ref 22–32)
Calcium: 8.8 mg/dL — ABNORMAL LOW (ref 8.9–10.3)
Chloride: 95 mmol/L — ABNORMAL LOW (ref 98–111)
Creatinine, Ser: 0.61 mg/dL (ref 0.44–1.00)
GFR, Estimated: >60 mL/min (ref >60)
Glucose, Bld: 137 mg/dL — ABNORMAL HIGH (ref 70–99)
Potassium: 3.2 mmol/L — ABNORMAL LOW (ref 3.5–5.1)
Sodium: 138 mmol/L (ref 135–145)
Total Bilirubin: 0.3 mg/dL (ref 0.3–1.2)
Total Protein: 6.2 g/dL — ABNORMAL LOW (ref 6.5–8.1)

## 2021-11-11 LAB — MAGNESIUM: Magnesium: 2.1 mg/dL (ref 1.7–2.4)

## 2021-11-11 LAB — FUNGUS CULTURE RESULT

## 2021-11-11 LAB — FUNGAL ORGANISM REFLEX

## 2021-11-11 NOTE — Progress Notes (Signed)
Pulmonary Critical Care Medicine ?[place] ?  ?PULMONARY CRITICAL CARE SERVICE ? ?PROGRESS NOTE ? ? ? ? ?Gina Petty  ?[id number]  ?DOB: 09-29-1945  ? ?DOA: 10/25/2021 ? ?Referring Physician: Satira Sark, MD ? ?HPI: Gina Petty is a 76 y.o. female being followed for ventilator/airway/oxygen weaning Acute on Chronic Respiratory Failure.  Patient is on T collar on 28% FiO2 the goal is for 12 hours ? ?Medications: ?Reviewed on Rounds ? ?Physical Exam: ? ?Vitals: Temperature is 97.2 pulse 85 respiratory 22 blood pressure 121/72 saturations 98% ? ?Ventilator Settings off the ventilator T collar FiO2 28% ? ?General: Comfortable at this time ?Neck: supple ?Cardiovascular: no malignant arrhythmias ?Respiratory: Scattered rhonchi expansion is equal ?Skin: no rash seen on limited exam ?Musculoskeletal: No gross abnormality ?Psychiatric:unable to assess ?Neurologic:no involuntary movements   ?   ?   ?Lab Data:  ? ?Basic Metabolic Panel: ?Recent Labs  ?Lab 11/05/21 ?0441 11/09/21 ?0426 11/10/21 ?5176 11/11/21 ?1607  ?NA 141 136  --  138  ?K 3.8 3.4* 3.5 3.2*  ?CL 101 95*  --  95*  ?CO2 33* 34*  --  35*  ?GLUCOSE 137* 116*  --  137*  ?BUN 18 25*  --  23  ?CREATININE 0.68 0.62  --  0.61  ?CALCIUM 8.7* 8.6*  --  8.8*  ?MG 2.0  --   --  2.1  ? ? ?ABG: ?No results for input(s): PHART, PCO2ART, PO2ART, HCO3, O2SAT in the last 168 hours. ? ?Liver Function Tests: ?Recent Labs  ?Lab 11/05/21 ?0441 11/09/21 ?0426 11/11/21 ?0445  ?AST 150* 116* 169*  ?ALT 244* 311* 344*  ?ALKPHOS 133* 180* 203*  ?BILITOT 0.7 0.4 0.3  ?PROT 6.0* 6.2* 6.2*  ?ALBUMIN 1.9* 1.8* 1.6*  ? ?No results for input(s): LIPASE, AMYLASE in the last 168 hours. ?No results for input(s): AMMONIA in the last 168 hours. ? ?CBC: ?Recent Labs  ?Lab 11/05/21 ?0441 11/09/21 ?0426 11/11/21 ?0445  ?WBC 5.8 10.9* 6.5  ?HGB 8.3* 8.5* 8.6*  ?HCT 27.5* 26.5* 28.4*  ?MCV 97.9 94.6 95.3  ?PLT 284 326 350  ? ? ?Cardiac Enzymes: ?No results for input(s):  CKTOTAL, CKMB, CKMBINDEX, TROPONINI in the last 168 hours. ? ?BNP (last 3 results) ?Recent Labs  ?  10/12/21 ?0245  ?BNP 265.8*  ? ? ?ProBNP (last 3 results) ?No results for input(s): PROBNP in the last 8760 hours. ? ?Radiological Exams: ?DG Chest Port 1 View ? ?Result Date: 11/10/2021 ?CLINICAL DATA:  A 76 year old female presents for evaluation of pneumonia, follow-up assessment. EXAM: PORTABLE CHEST 1 VIEW COMPARISON:  November 05, 2021. FINDINGS: Tracheostomy tube remains in place tip approximately 5 cm above the carina between clavicular heads. Image rotated slightly to the RIGHT. Weighted tip feeding tube remains in place tip off the field of view. Cardiomediastinal contours and hilar structures are stable. Basilar airspace disease slightly less confluent than on previous imaging. No visible pneumothorax. EKG leads project over the chest. On limited assessment there is no acute skeletal process. IMPRESSION: 1. Basilar airspace disease slightly less confluent than on previous imaging. May reflect improving pneumonia, attention on follow-up. 2. Otherwise no change in the appearance of the chest. Electronically Signed   By: Zetta Bills M.D.   On: 11/10/2021 11:50   ? ?Assessment/Plan ?Active Problems: ?  CAP (community acquired pneumonia) ?  Acute on chronic respiratory failure with hypoxia (HCC) ?  Anxiety disorder ?  Tracheostomy status ([place]) ?  COPD, severe ([place]) ? ? ?Acute  on chronic respiratory failure with hypoxia we will continue with the weaning protocol to follow her goal on T-piece ?Community-acquired pneumonia has been treated with antibiotics continue with supportive care ?Anxiety disorder no significant change ?Severe COPD medical management ?Tracheostomy remains in place ? ? ?I have personally seen and evaluated the patient, evaluated laboratory and imaging results, formulated the assessment and plan and placed orders. ?The Patient requires high complexity decision making with multiple systems  involvement.  ?Rounds were done with the Respiratory Therapy Director and Staff therapists and discussed with nursing staff also. ? ?Allyne Gee, MD FCCP ?Pulmonary Critical Care Medicine ?Sleep Medicine ? ?

## 2021-11-12 DIAGNOSIS — J9621 Acute and chronic respiratory failure with hypoxia: Secondary | ICD-10-CM | POA: Diagnosis not present

## 2021-11-12 DIAGNOSIS — Z930 Tracheostomy status: Secondary | ICD-10-CM | POA: Diagnosis not present

## 2021-11-12 DIAGNOSIS — J449 Chronic obstructive pulmonary disease, unspecified: Secondary | ICD-10-CM | POA: Diagnosis not present

## 2021-11-12 DIAGNOSIS — J189 Pneumonia, unspecified organism: Secondary | ICD-10-CM | POA: Diagnosis not present

## 2021-11-12 DIAGNOSIS — F419 Anxiety disorder, unspecified: Secondary | ICD-10-CM | POA: Diagnosis not present

## 2021-11-12 LAB — URINALYSIS, ROUTINE W REFLEX MICROSCOPIC
Bilirubin Urine: NEGATIVE
Glucose, UA: NEGATIVE mg/dL
Hgb urine dipstick: NEGATIVE
Ketones, ur: NEGATIVE mg/dL
Leukocytes,Ua: NEGATIVE
Nitrite: NEGATIVE
Protein, ur: NEGATIVE mg/dL
Specific Gravity, Urine: 1.015 (ref 1.005–1.030)
pH: 6 (ref 5.0–8.0)

## 2021-11-12 LAB — POTASSIUM: Potassium: 3.6 mmol/L (ref 3.5–5.1)

## 2021-11-12 NOTE — Progress Notes (Signed)
Pulmonary Critical Care Medicine ?[place] ?  ?PULMONARY CRITICAL CARE SERVICE ? ?PROGRESS NOTE ? ? ? ? ?Andee Poles [place]  ?[id number]  ?DOB: [birth year]  ? ?DOA: 10/25/2021 ? ?Referring Physician: Satira Sark, MD ? ?HPI: Gina Petty is a 76 y.o. female being followed for ventilator/airway/oxygen weaning Acute on Chronic Respiratory Failure.  Patient is on pressure support weaning on 28% FiO2 currently on a pressure of 12/5 ? ?Medications: ?Reviewed on Rounds ? ?Physical Exam: ? ?Vitals: Temperature is 98.9 pulse 92 respiratory 20 blood pressure 121/69 saturations 97% ? ?Ventilator Settings on pressure support FiO2 28% tidal volume 625 PEEP of 5 pressure of 12 ? ?General: Comfortable at this time ?Neck: supple ?Cardiovascular: no malignant arrhythmias ?Respiratory: Scattered rhonchi very coarse breath sounds ?Skin: no rash seen on limited exam ?Musculoskeletal: No gross abnormality ?Psychiatric:unable to assess ?Neurologic:no involuntary movements   ?   ?   ?Lab Data:  ? ?Basic Metabolic Panel: ?Recent Labs  ?Lab 11/09/21 ?0426 11/10/21 ?4709 11/11/21 ?0445 11/12/21 ?6283  ?NA 136  --  138  --   ?K 3.4* 3.5 3.2* 3.6  ?CL 95*  --  95*  --   ?CO2 34*  --  35*  --   ?GLUCOSE 116*  --  137*  --   ?BUN 25*  --  23  --   ?CREATININE 0.62  --  0.61  --   ?CALCIUM 8.6*  --  8.8*  --   ?MG  --   --  2.1  --   ? ? ?ABG: ?No results for input(s): PHART, PCO2ART, PO2ART, HCO3, O2SAT in the last 168 hours. ? ?Liver Function Tests: ?Recent Labs  ?Lab 11/09/21 ?0426 11/11/21 ?6629  ?AST 116* 169*  ?ALT 311* 344*  ?ALKPHOS 180* 203*  ?BILITOT 0.4 0.3  ?PROT 6.2* 6.2*  ?ALBUMIN 1.8* 1.6*  ? ?No results for input(s): LIPASE, AMYLASE in the last 168 hours. ?No results for input(s): AMMONIA in the last 168 hours. ? ?CBC: ?Recent Labs  ?Lab 11/09/21 ?0426 11/11/21 ?4765  ?WBC 10.9* 6.5  ?HGB 8.5* 8.6*  ?HCT 26.5* 28.4*  ?MCV 94.6 95.3  ?PLT 326 350  ? ? ?Cardiac Enzymes: ?No results for input(s): CKTOTAL, CKMB,  CKMBINDEX, TROPONINI in the last 168 hours. ? ?BNP (last 3 results) ?Recent Labs  ?  10/12/21 ?0245  ?BNP 265.8*  ? ? ?ProBNP (last 3 results) ?No results for input(s): PROBNP in the last 8760 hours. ? ?Radiological Exams: ?No results found. ? ?Assessment/Plan ?Active Problems: ?  CAP (community acquired pneumonia) ?  Acute on chronic respiratory failure with hypoxia (HCC) ?  Anxiety disorder ?  Tracheostomy status ([place]) ?  COPD, severe ([place]) ? ? ?Acute on chronic respiratory failure with hypoxia plan is going to be to continue with the weaning protocol pressure of 12/5 ?Community-acquired pneumonia has been treated with antibiotics we will continue to monitor ?Anxiety disorder patient is at baseline ?Tracheostomy remains in place ?Severe COPD medical management we will continue to follow along closely ? ? ?I have personally seen and evaluated the patient, evaluated laboratory and imaging results, formulated the assessment and plan and placed orders. ?The Patient requires high complexity decision making with multiple systems involvement.  ?Rounds were done with the Respiratory Therapy Director and Staff therapists and discussed with nursing staff also. ? ?Allyne Gee, MD FCCP ?Pulmonary Critical Care Medicine ?Sleep Medicine  ?

## 2021-11-13 DIAGNOSIS — Z930 Tracheostomy status: Secondary | ICD-10-CM | POA: Diagnosis not present

## 2021-11-13 DIAGNOSIS — J189 Pneumonia, unspecified organism: Secondary | ICD-10-CM | POA: Diagnosis not present

## 2021-11-13 DIAGNOSIS — J9621 Acute and chronic respiratory failure with hypoxia: Secondary | ICD-10-CM | POA: Diagnosis not present

## 2021-11-13 DIAGNOSIS — J449 Chronic obstructive pulmonary disease, unspecified: Secondary | ICD-10-CM | POA: Diagnosis not present

## 2021-11-13 DIAGNOSIS — F419 Anxiety disorder, unspecified: Secondary | ICD-10-CM | POA: Diagnosis not present

## 2021-11-13 LAB — BASIC METABOLIC PANEL
Anion gap: 7 (ref 5–15)
BUN: 24 mg/dL — ABNORMAL HIGH (ref 8–23)
CO2: 32 mmol/L (ref 22–32)
Calcium: 8.9 mg/dL (ref 8.9–10.3)
Chloride: 98 mmol/L (ref 98–111)
Creatinine, Ser: 0.58 mg/dL (ref 0.44–1.00)
GFR, Estimated: >60 mL/min (ref >60)
Glucose, Bld: 125 mg/dL — ABNORMAL HIGH (ref 70–99)
Potassium: 3.4 mmol/L — ABNORMAL LOW (ref 3.5–5.1)
Sodium: 137 mmol/L (ref 135–145)

## 2021-11-13 LAB — CBC
HCT: 29.4 % — ABNORMAL LOW (ref 36.0–46.0)
Hemoglobin: 9.3 g/dL — ABNORMAL LOW (ref 12.0–15.0)
MCH: 29.6 pg (ref 26.0–34.0)
MCHC: 31.6 g/dL (ref 30.0–36.0)
MCV: 93.6 fL (ref 80.0–100.0)
Platelets: 364 10*3/uL (ref 150–400)
RBC: 3.14 MIL/uL — ABNORMAL LOW (ref 3.87–5.11)
RDW: 15.1 % (ref 11.5–15.5)
WBC: 6.6 10*3/uL (ref 4.0–10.5)
nRBC: 0 % (ref 0.0–0.2)

## 2021-11-13 NOTE — Progress Notes (Signed)
Pulmonary Critical Care Medicine ?[place] ?  ?PULMONARY CRITICAL CARE SERVICE ? ?PROGRESS NOTE ? ? ? ? ?Gina Petty  ?[id number]  ?DOB: 06-19-1946  ? ?DOA: 10/25/2021 ? ?Referring Physician: Satira Sark, MD ? ?HPI: Gina Petty is a 76 y.o. female being followed for ventilator/airway/oxygen weaning Acute on Chronic Respiratory Failure.  Patient did 16 hours of T collar yesterday this morning was resting on pressure support ? ?Medications: ?Reviewed on Rounds ? ?Physical Exam: ? ?Vitals: Temperature is 98.6 pulse 81 respiratory rate is 18 blood pressure 128/61 saturations 98% ? ?Ventilator Settings pressure support FiO2 28% pressure 12/5 ? ?General: Comfortable at this time ?Neck: supple ?Cardiovascular: no malignant arrhythmias ?Respiratory: Scattered rhonchi expansion is equal ?Skin: no rash seen on limited exam ?Musculoskeletal: No gross abnormality ?Psychiatric:unable to assess ?Neurologic:no involuntary movements   ?   ?   ?Lab Data:  ? ?Basic Metabolic Panel: ?Recent Labs  ?Lab 11/09/21 ?0426 11/10/21 ?2505 11/11/21 ?0445 11/12/21 ?3976 11/13/21 ?0403  ?NA 136  --  138  --  137  ?K 3.4* 3.5 3.2* 3.6 3.4*  ?CL 95*  --  95*  --  98  ?CO2 34*  --  35*  --  32  ?GLUCOSE 116*  --  137*  --  125*  ?BUN 25*  --  23  --  24*  ?CREATININE 0.62  --  0.61  --  0.58  ?CALCIUM 8.6*  --  8.8*  --  8.9  ?MG  --   --  2.1  --   --   ? ? ?ABG: ?No results for input(s): PHART, PCO2ART, PO2ART, HCO3, O2SAT in the last 168 hours. ? ?Liver Function Tests: ?Recent Labs  ?Lab 11/09/21 ?0426 11/11/21 ?7341  ?AST 116* 169*  ?ALT 311* 344*  ?ALKPHOS 180* 203*  ?BILITOT 0.4 0.3  ?PROT 6.2* 6.2*  ?ALBUMIN 1.8* 1.6*  ? ?No results for input(s): LIPASE, AMYLASE in the last 168 hours. ?No results for input(s): AMMONIA in the last 168 hours. ? ?CBC: ?Recent Labs  ?Lab 11/09/21 ?0426 11/11/21 ?0445 11/13/21 ?0403  ?WBC 10.9* 6.5 6.6  ?HGB 8.5* 8.6* 9.3*  ?HCT 26.5* 28.4* 29.4*  ?MCV 94.6 95.3 93.6  ?PLT 326 350 364   ? ? ?Cardiac Enzymes: ?No results for input(s): CKTOTAL, CKMB, CKMBINDEX, TROPONINI in the last 168 hours. ? ?BNP (last 3 results) ?Recent Labs  ?  10/12/21 ?0245  ?BNP 265.8*  ? ? ?ProBNP (last 3 results) ?No results for input(s): PROBNP in the last 8760 hours. ? ?Radiological Exams: ?No results found. ? ?Assessment/Plan ?Active Problems: ?  CAP (community acquired pneumonia) ?  Acute on chronic respiratory failure with hypoxia (HCC) ?  Anxiety disorder ?  Tracheostomy status ([place]) ?  COPD, severe ([place]) ? ? ?Acute on chronic respiratory failure hypoxia we will continue with the weaning protocol go back to pressure support wean once again ?Community-acquired pneumonia has been treated with antibiotics ?Anxiety disorder no change we will continue to follow along ?Severe COPD medical management ?Tracheostomy remains in place ? ? ?I have personally seen and evaluated the patient, evaluated laboratory and imaging results, formulated the assessment and plan and placed orders. ?The Patient requires high complexity decision making with multiple systems involvement.  ?Rounds were done with the Respiratory Therapy Director and Staff therapists and discussed with nursing staff also. ? ?Allyne Gee, MD FCCP ?Pulmonary Critical Care Medicine ?Sleep Medicine ? ?

## 2021-11-14 ENCOUNTER — Other Ambulatory Visit (HOSPITAL_COMMUNITY): Payer: Medicare Other

## 2021-11-14 DIAGNOSIS — R7989 Other specified abnormal findings of blood chemistry: Secondary | ICD-10-CM | POA: Diagnosis not present

## 2021-11-14 DIAGNOSIS — J189 Pneumonia, unspecified organism: Secondary | ICD-10-CM | POA: Diagnosis not present

## 2021-11-14 DIAGNOSIS — F419 Anxiety disorder, unspecified: Secondary | ICD-10-CM | POA: Diagnosis not present

## 2021-11-14 DIAGNOSIS — J9621 Acute and chronic respiratory failure with hypoxia: Secondary | ICD-10-CM | POA: Diagnosis not present

## 2021-11-14 DIAGNOSIS — Z930 Tracheostomy status: Secondary | ICD-10-CM | POA: Diagnosis not present

## 2021-11-14 DIAGNOSIS — K5730 Diverticulosis of large intestine without perforation or abscess without bleeding: Secondary | ICD-10-CM | POA: Diagnosis not present

## 2021-11-14 DIAGNOSIS — I10 Essential (primary) hypertension: Secondary | ICD-10-CM | POA: Diagnosis not present

## 2021-11-14 DIAGNOSIS — J449 Chronic obstructive pulmonary disease, unspecified: Secondary | ICD-10-CM | POA: Diagnosis not present

## 2021-11-14 DIAGNOSIS — K6389 Other specified diseases of intestine: Secondary | ICD-10-CM | POA: Diagnosis not present

## 2021-11-14 LAB — POTASSIUM: Potassium: 3.7 mmol/L (ref 3.5–5.1)

## 2021-11-14 LAB — URINE CULTURE: Culture: 7000 — AB

## 2021-11-14 MED ORDER — IOHEXOL 300 MG/ML  SOLN
100.0000 mL | Freq: Once | INTRAMUSCULAR | Status: AC | PRN
  Administered 2021-11-14: 100 mL via INTRAVENOUS

## 2021-11-14 NOTE — Progress Notes (Signed)
Pulmonary Critical Care Medicine ?[place] ?  ?PULMONARY CRITICAL CARE SERVICE ? ?PROGRESS NOTE ? ? ? ? ?Gina Petty  ?[id number]  ?DOB: June 08, 1946  ? ?DOA: 10/25/2021 ? ?Referring Physician: Satira Sark, MD ? ?HPI: Gina Petty is a 76 y.o. female being followed for ventilator/airway/oxygen weaning Acute on Chronic Respiratory Failure.  Patient is afebrile resting comfortably without distress has mild T collar over 20 hours ? ?Medications: ?Reviewed on Rounds ? ?Physical Exam: ? ?Vitals: Temperature is 97.0 pulse 101 respiratory 24 blood pressure 105/60 saturations 99% ? ?Ventilator Settings currently on T collar FiO2 is 35% ? ?General: Comfortable at this time ?Neck: supple ?Cardiovascular: no malignant arrhythmias ?Respiratory: Scattered rhonchi expansion equal ?Skin: no rash seen on limited exam ?Musculoskeletal: No gross abnormality ?Psychiatric:unable to assess ?Neurologic:no involuntary movements   ?   ?   ?Lab Data:  ? ?Basic Metabolic Panel: ?Recent Labs  ?Lab 11/09/21 ?0426 11/10/21 ?5188 11/11/21 ?0445 11/12/21 ?4166 11/13/21 ?0403  ?NA 136  --  138  --  137  ?K 3.4* 3.5 3.2* 3.6 3.4*  ?CL 95*  --  95*  --  98  ?CO2 34*  --  35*  --  32  ?GLUCOSE 116*  --  137*  --  125*  ?BUN 25*  --  23  --  24*  ?CREATININE 0.62  --  0.61  --  0.58  ?CALCIUM 8.6*  --  8.8*  --  8.9  ?MG  --   --  2.1  --   --   ? ? ?ABG: ?No results for input(s): PHART, PCO2ART, PO2ART, HCO3, O2SAT in the last 168 hours. ? ?Liver Function Tests: ?Recent Labs  ?Lab 11/09/21 ?0426 11/11/21 ?0630  ?AST 116* 169*  ?ALT 311* 344*  ?ALKPHOS 180* 203*  ?BILITOT 0.4 0.3  ?PROT 6.2* 6.2*  ?ALBUMIN 1.8* 1.6*  ? ?No results for input(s): LIPASE, AMYLASE in the last 168 hours. ?No results for input(s): AMMONIA in the last 168 hours. ? ?CBC: ?Recent Labs  ?Lab 11/09/21 ?0426 11/11/21 ?0445 11/13/21 ?0403  ?WBC 10.9* 6.5 6.6  ?HGB 8.5* 8.6* 9.3*  ?HCT 26.5* 28.4* 29.4*  ?MCV 94.6 95.3 93.6  ?PLT 326 350 364  ? ? ?Cardiac  Enzymes: ?No results for input(s): CKTOTAL, CKMB, CKMBINDEX, TROPONINI in the last 168 hours. ? ?BNP (last 3 results) ?Recent Labs  ?  10/12/21 ?0245  ?BNP 265.8*  ? ? ?ProBNP (last 3 results) ?No results for input(s): PROBNP in the last 8760 hours. ? ?Radiological Exams: ?No results found. ? ?Assessment/Plan ?Active Problems: ?  CAP (community acquired pneumonia) ?  Acute on chronic respiratory failure with hypoxia (HCC) ?  Anxiety disorder ?  Tracheostomy status ([place]) ?  COPD, severe ([place]) ? ? ?Acute on chronic respiratory failure hypoxia plan is going to continue with T collar with a goal of 20 hours ?Community-acquired pneumonia has been treated with antibiotics we will continue to follow ?Anxiety patient is at baseline ?Severe COPD nebulizers as needed ?Tracheostomy remains in place ? ? ?I have personally seen and evaluated the patient, evaluated laboratory and imaging results, formulated the assessment and plan and placed orders. ?The Patient requires high complexity decision making with multiple systems involvement.  ?Rounds were done with the Respiratory Therapy Director and Staff therapists and discussed with nursing staff also. ? ?Allyne Gee, MD FCCP ?Pulmonary Critical Care Medicine ?Sleep Medicine ? ?

## 2021-11-15 DIAGNOSIS — F419 Anxiety disorder, unspecified: Secondary | ICD-10-CM | POA: Diagnosis not present

## 2021-11-15 DIAGNOSIS — J9621 Acute and chronic respiratory failure with hypoxia: Secondary | ICD-10-CM | POA: Diagnosis not present

## 2021-11-15 DIAGNOSIS — J189 Pneumonia, unspecified organism: Secondary | ICD-10-CM | POA: Diagnosis not present

## 2021-11-15 DIAGNOSIS — Z930 Tracheostomy status: Secondary | ICD-10-CM | POA: Diagnosis not present

## 2021-11-15 DIAGNOSIS — J449 Chronic obstructive pulmonary disease, unspecified: Secondary | ICD-10-CM | POA: Diagnosis not present

## 2021-11-15 LAB — CULTURE, BLOOD (ROUTINE X 2)
Culture: NO GROWTH
Culture: NO GROWTH
Special Requests: ADEQUATE
Special Requests: ADEQUATE

## 2021-11-15 LAB — COMPREHENSIVE METABOLIC PANEL
ALT: 200 U/L — ABNORMAL HIGH (ref 0–44)
AST: 43 U/L — ABNORMAL HIGH (ref 15–41)
Albumin: 1.8 g/dL — ABNORMAL LOW (ref 3.5–5.0)
Alkaline Phosphatase: 162 U/L — ABNORMAL HIGH (ref 38–126)
Anion gap: 9 (ref 5–15)
BUN: 23 mg/dL (ref 8–23)
CO2: 30 mmol/L (ref 22–32)
Calcium: 8.9 mg/dL (ref 8.9–10.3)
Chloride: 97 mmol/L — ABNORMAL LOW (ref 98–111)
Creatinine, Ser: 0.6 mg/dL (ref 0.44–1.00)
GFR, Estimated: >60 mL/min (ref >60)
Glucose, Bld: 139 mg/dL — ABNORMAL HIGH (ref 70–99)
Potassium: 3.1 mmol/L — ABNORMAL LOW (ref 3.5–5.1)
Sodium: 136 mmol/L (ref 135–145)
Total Bilirubin: 0.4 mg/dL (ref 0.3–1.2)
Total Protein: 5.9 g/dL — ABNORMAL LOW (ref 6.5–8.1)

## 2021-11-15 LAB — CBC
HCT: 31.6 % — ABNORMAL LOW (ref 36.0–46.0)
Hemoglobin: 9.8 g/dL — ABNORMAL LOW (ref 12.0–15.0)
MCH: 28.8 pg (ref 26.0–34.0)
MCHC: 31 g/dL (ref 30.0–36.0)
MCV: 92.9 fL (ref 80.0–100.0)
Platelets: 340 10*3/uL (ref 150–400)
RBC: 3.4 MIL/uL — ABNORMAL LOW (ref 3.87–5.11)
RDW: 15.4 % (ref 11.5–15.5)
WBC: 7.5 10*3/uL (ref 4.0–10.5)
nRBC: 0 % (ref 0.0–0.2)

## 2021-11-15 LAB — BLOOD GAS, ARTERIAL
Acid-Base Excess: 6.6 mmol/L — ABNORMAL HIGH (ref 0.0–2.0)
Bicarbonate: 29.1 mmol/L — ABNORMAL HIGH (ref 20.0–28.0)
O2 Saturation: 98.2 %
Patient temperature: 36.5
pCO2 arterial: 33 mmHg (ref 32–48)
pH, Arterial: 7.55 — ABNORMAL HIGH (ref 7.35–7.45)
pO2, Arterial: 77 mmHg — ABNORMAL LOW (ref 83–108)

## 2021-11-15 NOTE — Progress Notes (Signed)
Pulmonary Critical Care Medicine ?[place] ?  ?PULMONARY CRITICAL CARE SERVICE ? ?PROGRESS NOTE ? ? ? ? ?Gina Petty  ?[id number]  ?DOB: [birth year]  ? ?DOA: 10/25/2021 ? ?Referring Physician: Satira Sark, MD ? ?HPI: Gina Petty is a 76 y.o. female being followed for ventilator/airway/oxygen weaning Acute on Chronic Respiratory Failure.  Patient is on T collar and the goal is to go beyond 24 hours looks good so far ? ?Medications: ?Reviewed on Rounds ? ?Physical Exam: ? ?Vitals: Temperature is 96.9 pulse 100 respiratory 30 blood pressure 107/67 saturations 94% ? ?Ventilator Settings on T collar FiO2 30% ? ?General: Comfortable at this time ?Neck: supple ?Cardiovascular: no malignant arrhythmias ?Respiratory: Scattered rhonchi expansion is equal ?Skin: no rash seen on limited exam ?Musculoskeletal: No gross abnormality ?Psychiatric:unable to assess ?Neurologic:no involuntary movements   ?   ?   ?Lab Data:  ? ?Basic Metabolic Panel: ?Recent Labs  ?Lab 11/09/21 ?0426 11/10/21 ?1324 11/11/21 ?0445 11/12/21 ?4010 11/13/21 ?0403 11/14/21 ?1010 11/15/21 ?0414  ?NA 136  --  138  --  137  --  136  ?K 3.4*   < > 3.2* 3.6 3.4* 3.7 3.1*  ?CL 95*  --  95*  --  98  --  97*  ?CO2 34*  --  35*  --  32  --  30  ?GLUCOSE 116*  --  137*  --  125*  --  139*  ?BUN 25*  --  23  --  24*  --  23  ?CREATININE 0.62  --  0.61  --  0.58  --  0.60  ?CALCIUM 8.6*  --  8.8*  --  8.9  --  8.9  ?MG  --   --  2.1  --   --   --   --   ? < > = values in this interval not displayed.  ? ? ?ABG: ?No results for input(s): PHART, PCO2ART, PO2ART, HCO3, O2SAT in the last 168 hours. ? ?Liver Function Tests: ?Recent Labs  ?Lab 11/09/21 ?0426 11/11/21 ?0445 11/15/21 ?2725  ?AST 116* 169* 43*  ?ALT 311* 344* 200*  ?ALKPHOS 180* 203* 162*  ?BILITOT 0.4 0.3 0.4  ?PROT 6.2* 6.2* 5.9*  ?ALBUMIN 1.8* 1.6* 1.8*  ? ?No results for input(s): LIPASE, AMYLASE in the last 168 hours. ?No results for input(s): AMMONIA in the last 168  hours. ? ?CBC: ?Recent Labs  ?Lab 11/09/21 ?0426 11/11/21 ?3664 11/13/21 ?0403 11/15/21 ?0414  ?WBC 10.9* 6.5 6.6 7.5  ?HGB 8.5* 8.6* 9.3* 9.8*  ?HCT 26.5* 28.4* 29.4* 31.6*  ?MCV 94.6 95.3 93.6 92.9  ?PLT 326 350 364 340  ? ? ?Cardiac Enzymes: ?No results for input(s): CKTOTAL, CKMB, CKMBINDEX, TROPONINI in the last 168 hours. ? ?BNP (last 3 results) ?Recent Labs  ?  10/12/21 ?0245  ?BNP 265.8*  ? ? ?ProBNP (last 3 results) ?No results for input(s): PROBNP in the last 8760 hours. ? ?Radiological Exams: ?CT ABDOMEN PELVIS W CONTRAST ? ?Result Date: 11/14/2021 ?CLINICAL DATA:  Abnormal elevated LFTs, has PEG tube. History hypertension EXAM: CT ABDOMEN AND PELVIS WITH CONTRAST TECHNIQUE: Multidetector CT imaging of the abdomen and pelvis was performed using the standard protocol following bolus administration of intravenous contrast. RADIATION DOSE REDUCTION: This exam was performed according to the departmental dose-optimization program which includes automated exposure control, adjustment of the mA and/or kV according to patient size and/or use of iterative reconstruction technique. CONTRAST:  143m OMNIPAQUE IOHEXOL 300 MG/ML SOLN IV. No  oral contrast. COMPARISON:  10/11/2021. FINDINGS: Lower chest: Bibasilar atelectasis, improved from previous exam in lower RIGHT lung. Emphysematous changes. Hepatobiliary: Gallbladder and liver normal appearance Pancreas: Normal appearance Spleen: Normal appearance Adrenals/Urinary Tract: Adrenal glands, kidneys, ureters, and bladder normal appearance Stomach/Bowel: Diverticulosis of descending and sigmoid colon without evidence of diverticulitis. Mild gaseous distention of sigmoid loop without obstruction. No PEG tube seen. Stomach and bowel loops otherwise normal appearance. Normal appendix. Vascular/Lymphatic: Atherosclerotic calcifications aorta and iliac arteries. Aorta normal caliber. No adenopathy. Reproductive: Calcification at uterine fundus. Uterus and ovaries otherwise  normal appearance. Other: No free air or free fluid.  No hernia. Musculoskeletal: RIGHT hip prosthesis. Bones demineralized. Degenerative disc disease changes lumbar spine. IMPRESSION: Distal colonic diverticulosis without evidence of diverticulitis. Bibasilar atelectasis, improved from previous exam. No acute intra-abdominal or intrapelvic abnormalities. Aortic Atherosclerosis (ICD10-I70.0) and Emphysema (ICD10-J43.9). Electronically Signed   By: Lavonia Dana M.D.   On: 11/14/2021 16:22   ? ?Assessment/Plan ?Active Problems: ?  CAP (community acquired pneumonia) ?  Acute on chronic respiratory failure with hypoxia (HCC) ?  Anxiety disorder ?  Tracheostomy status ([place]) ?  COPD, severe ([place]) ? ? ?Acute on chronic respiratory failure with hypoxia plan is to continue with the weaning protocol T-bar goal of 24 hours we will continue to follow along ?Community-acquired pneumonia has been treated follow x-rays as needed ?Anxiety disorder no change ?Severe COPD nebulizers medical management ?Tracheostomy remains in place ? ? ?I have personally seen and evaluated the patient, evaluated laboratory and imaging results, formulated the assessment and plan and placed orders. ?The Patient requires high complexity decision making with multiple systems involvement.  ?Rounds were done with the Respiratory Therapy Director and Staff therapists and discussed with nursing staff also. ? ?Allyne Gee, MD FCCP ?Pulmonary Critical Care Medicine ?Sleep Medicine ? ?

## 2021-11-16 DIAGNOSIS — J189 Pneumonia, unspecified organism: Secondary | ICD-10-CM | POA: Diagnosis not present

## 2021-11-16 DIAGNOSIS — Z930 Tracheostomy status: Secondary | ICD-10-CM | POA: Diagnosis not present

## 2021-11-16 DIAGNOSIS — J9621 Acute and chronic respiratory failure with hypoxia: Secondary | ICD-10-CM | POA: Diagnosis not present

## 2021-11-16 DIAGNOSIS — J449 Chronic obstructive pulmonary disease, unspecified: Secondary | ICD-10-CM | POA: Diagnosis not present

## 2021-11-16 DIAGNOSIS — F419 Anxiety disorder, unspecified: Secondary | ICD-10-CM | POA: Diagnosis not present

## 2021-11-16 LAB — POTASSIUM: Potassium: 4 mmol/L (ref 3.5–5.1)

## 2021-11-16 NOTE — Progress Notes (Signed)
Pulmonary Critical Care Medicine ?[place] ?  ?PULMONARY CRITICAL CARE SERVICE ? ?PROGRESS NOTE ? ? ? ? ?Gina Petty  ?[id number]  ?DOB: 03/30/1946  ? ?DOA: 10/25/2021 ? ?Referring Physician: Satira Sark, MD ? ?HPI: Gina Petty is a 76 y.o. female being followed for ventilator/airway/oxygen weaning Acute on Chronic Respiratory Failure.  Patient currently is on T collar has been on 30% FiO2 goal of 48 hours today ? ?Medications: ?Reviewed on Rounds ? ?Physical Exam: ? ?Vitals: Temperature is 98.9 pulse 100 respiratory rate is 30 blood pressure 152/88 saturations 98% ? ?Ventilator Settings on T collar FiO2 is 30% ? ?General: Comfortable at this time ?Neck: supple ?Cardiovascular: no malignant arrhythmias ?Respiratory: No rhonchi no rales noted ?Skin: no rash seen on limited exam ?Musculoskeletal: No gross abnormality ?Psychiatric:unable to assess ?Neurologic:no involuntary movements   ?   ?   ?Lab Data:  ? ?Basic Metabolic Panel: ?Recent Labs  ?Lab 11/11/21 ?0445 11/12/21 ?0923 11/13/21 ?0403 11/14/21 ?1010 11/15/21 ?0414  ?NA 138  --  137  --  136  ?K 3.2* 3.6 3.4* 3.7 3.1*  ?CL 95*  --  98  --  97*  ?CO2 35*  --  32  --  30  ?GLUCOSE 137*  --  125*  --  139*  ?BUN 23  --  24*  --  23  ?CREATININE 0.61  --  0.58  --  0.60  ?CALCIUM 8.8*  --  8.9  --  8.9  ?MG 2.1  --   --   --   --   ? ? ?ABG: ?Recent Labs  ?Lab 11/15/21 ?1830  ?PHART 7.55*  ?PCO2ART 33  ?PO2ART 77*  ?HCO3 29.1*  ?O2SAT 98.2  ? ? ?Liver Function Tests: ?Recent Labs  ?Lab 11/11/21 ?1696 11/15/21 ?7893  ?AST 169* 43*  ?ALT 344* 200*  ?ALKPHOS 203* 162*  ?BILITOT 0.3 0.4  ?PROT 6.2* 5.9*  ?ALBUMIN 1.6* 1.8*  ? ?No results for input(s): LIPASE, AMYLASE in the last 168 hours. ?No results for input(s): AMMONIA in the last 168 hours. ? ?CBC: ?Recent Labs  ?Lab 11/11/21 ?8101 11/13/21 ?0403 11/15/21 ?0414  ?WBC 6.5 6.6 7.5  ?HGB 8.6* 9.3* 9.8*  ?HCT 28.4* 29.4* 31.6*  ?MCV 95.3 93.6 92.9  ?PLT 350 364 340  ? ? ?Cardiac Enzymes: ?No  results for input(s): CKTOTAL, CKMB, CKMBINDEX, TROPONINI in the last 168 hours. ? ?BNP (last 3 results) ?Recent Labs  ?  10/12/21 ?0245  ?BNP 265.8*  ? ? ?ProBNP (last 3 results) ?No results for input(s): PROBNP in the last 8760 hours. ? ?Radiological Exams: ?CT ABDOMEN PELVIS W CONTRAST ? ?Result Date: 11/14/2021 ?CLINICAL DATA:  Abnormal elevated LFTs, has PEG tube. History hypertension EXAM: CT ABDOMEN AND PELVIS WITH CONTRAST TECHNIQUE: Multidetector CT imaging of the abdomen and pelvis was performed using the standard protocol following bolus administration of intravenous contrast. RADIATION DOSE REDUCTION: This exam was performed according to the departmental dose-optimization program which includes automated exposure control, adjustment of the mA and/or kV according to patient size and/or use of iterative reconstruction technique. CONTRAST:  132m OMNIPAQUE IOHEXOL 300 MG/ML SOLN IV. No oral contrast. COMPARISON:  10/11/2021. FINDINGS: Lower chest: Bibasilar atelectasis, improved from previous exam in lower RIGHT lung. Emphysematous changes. Hepatobiliary: Gallbladder and liver normal appearance Pancreas: Normal appearance Spleen: Normal appearance Adrenals/Urinary Tract: Adrenal glands, kidneys, ureters, and bladder normal appearance Stomach/Bowel: Diverticulosis of descending and sigmoid colon without evidence of diverticulitis. Mild gaseous distention of sigmoid loop  without obstruction. No PEG tube seen. Stomach and bowel loops otherwise normal appearance. Normal appendix. Vascular/Lymphatic: Atherosclerotic calcifications aorta and iliac arteries. Aorta normal caliber. No adenopathy. Reproductive: Calcification at uterine fundus. Uterus and ovaries otherwise normal appearance. Other: No free air or free fluid.  No hernia. Musculoskeletal: RIGHT hip prosthesis. Bones demineralized. Degenerative disc disease changes lumbar spine. IMPRESSION: Distal colonic diverticulosis without evidence of diverticulitis.  Bibasilar atelectasis, improved from previous exam. No acute intra-abdominal or intrapelvic abnormalities. Aortic Atherosclerosis (ICD10-I70.0) and Emphysema (ICD10-J43.9). Electronically Signed   By: Lavonia Dana M.D.   On: 11/14/2021 16:22   ? ?Assessment/Plan ?Active Problems: ?  CAP (community acquired pneumonia) ?  Acute on chronic respiratory failure with hypoxia (HCC) ?  Anxiety disorder ?  Tracheostomy status ([place]) ?  COPD, severe ([place]) ? ? ?Acute on chronic respiratory failure with hypoxia we will continue with T-piece patient's oxygen is 30% titrate as tolerated ?Community-acquired pneumonia has been treated slow improvement ?Anxiety disorder supportive care medical management ?Severe COPD nebulizers as needed ?Tracheostomy doing well we will continue with the T collar ? ? ?I have personally seen and evaluated the patient, evaluated laboratory and imaging results, formulated the assessment and plan and placed orders. ?The Patient requires high complexity decision making with multiple systems involvement.  ?Rounds were done with the Respiratory Therapy Director and Staff therapists and discussed with nursing staff also. ? ?Allyne Gee, MD FCCP ?Pulmonary Critical Care Medicine ?Sleep Medicine ? ?

## 2021-11-17 DIAGNOSIS — J189 Pneumonia, unspecified organism: Secondary | ICD-10-CM | POA: Diagnosis not present

## 2021-11-17 DIAGNOSIS — J449 Chronic obstructive pulmonary disease, unspecified: Secondary | ICD-10-CM | POA: Diagnosis not present

## 2021-11-17 DIAGNOSIS — F419 Anxiety disorder, unspecified: Secondary | ICD-10-CM | POA: Diagnosis not present

## 2021-11-17 DIAGNOSIS — Z930 Tracheostomy status: Secondary | ICD-10-CM | POA: Diagnosis not present

## 2021-11-17 DIAGNOSIS — J9621 Acute and chronic respiratory failure with hypoxia: Secondary | ICD-10-CM | POA: Diagnosis not present

## 2021-11-17 NOTE — Progress Notes (Signed)
Pulmonary Critical Care Medicine ?[place] ?  ?PULMONARY CRITICAL CARE SERVICE ? ?PROGRESS NOTE ? ? ? ? ?Gina Petty  ?[id number]  ?DOB: 10-Dec-1945  ? ?DOA: 10/25/2021 ? ?Referring Physician: Satira Sark, MD ? ?HPI: Gina Petty is a 76 y.o. female being followed for ventilator/airway/oxygen weaning Acute on Chronic Respiratory Failure.  Patient is on T collar on 30% FiO2 supposed to be doing 72 hours today ? ?Medications: ?Reviewed on Rounds ? ?Physical Exam: ? ?Vitals: Temperature is 98.7 pulse of 100 respiratory rate 26 blood pressure 132/78 saturations 96% ? ?Ventilator Settings off the ventilator on T collar 30% ? ?General: Comfortable at this time ?Neck: supple ?Cardiovascular: no malignant arrhythmias ?Respiratory: Scattered rhonchi no rales ?Skin: no rash seen on limited exam ?Musculoskeletal: No gross abnormality ?Psychiatric:unable to assess ?Neurologic:no involuntary movements   ?   ?   ?Lab Data:  ? ?Basic Metabolic Panel: ?Recent Labs  ?Lab 11/11/21 ?0445 11/12/21 ?0923 11/13/21 ?0403 11/14/21 ?1010 11/15/21 ?0414 11/16/21 ?2047  ?NA 138  --  137  --  136  --   ?K 3.2* 3.6 3.4* 3.7 3.1* 4.0  ?CL 95*  --  98  --  97*  --   ?CO2 35*  --  32  --  30  --   ?GLUCOSE 137*  --  125*  --  139*  --   ?BUN 23  --  24*  --  23  --   ?CREATININE 0.61  --  0.58  --  0.60  --   ?CALCIUM 8.8*  --  8.9  --  8.9  --   ?MG 2.1  --   --   --   --   --   ? ? ?ABG: ?Recent Labs  ?Lab 11/15/21 ?1830  ?PHART 7.55*  ?PCO2ART 33  ?PO2ART 77*  ?HCO3 29.1*  ?O2SAT 98.2  ? ? ?Liver Function Tests: ?Recent Labs  ?Lab 11/11/21 ?2876 11/15/21 ?8115  ?AST 169* 43*  ?ALT 344* 200*  ?ALKPHOS 203* 162*  ?BILITOT 0.3 0.4  ?PROT 6.2* 5.9*  ?ALBUMIN 1.6* 1.8*  ? ?No results for input(s): LIPASE, AMYLASE in the last 168 hours. ?No results for input(s): AMMONIA in the last 168 hours. ? ?CBC: ?Recent Labs  ?Lab 11/11/21 ?7262 11/13/21 ?0403 11/15/21 ?0414  ?WBC 6.5 6.6 7.5  ?HGB 8.6* 9.3* 9.8*  ?HCT 28.4* 29.4* 31.6*   ?MCV 95.3 93.6 92.9  ?PLT 350 364 340  ? ? ?Cardiac Enzymes: ?No results for input(s): CKTOTAL, CKMB, CKMBINDEX, TROPONINI in the last 168 hours. ? ?BNP (last 3 results) ?Recent Labs  ?  10/12/21 ?0245  ?BNP 265.8*  ? ? ?ProBNP (last 3 results) ?No results for input(s): PROBNP in the last 8760 hours. ? ?Radiological Exams: ?No results found. ? ?Assessment/Plan ?Active Problems: ?  CAP (community acquired pneumonia) ?  Acute on chronic respiratory failure with hypoxia (HCC) ?  Anxiety disorder ?  Tracheostomy status ([place]) ?  COPD, severe ([place]) ? ? ?Acute on chronic respiratory failure hypoxia plan is to continue with weaning on T collar the goal will be 72 hours also we will go ahead and change the trach out today ?Community-acquired pneumonia treated clinically is improving ?Anxiety disorder supportive care ?Tracheostomy will be changed out to a #6 cuffless ?Severe COPD had a conversation yesterday with the patient's daughter regarding the diagnosis of emphysema/COPD.  Explained to her that I do not know the extent of disease right now and currently it is an  anatomical diagnosis and so therefore she does need to see a pulmonologist after she is discharged ? ? ?I have personally seen and evaluated the patient, evaluated laboratory and imaging results, formulated the assessment and plan and placed orders. ?The Patient requires high complexity decision making with multiple systems involvement.  ?Rounds were done with the Respiratory Therapy Director and Staff therapists and discussed with nursing staff also. ? ?Gina Gee, MD FCCP ?Pulmonary Critical Care Medicine ?Sleep Medicine ? ?

## 2021-11-18 DIAGNOSIS — J9621 Acute and chronic respiratory failure with hypoxia: Secondary | ICD-10-CM | POA: Diagnosis not present

## 2021-11-18 DIAGNOSIS — J449 Chronic obstructive pulmonary disease, unspecified: Secondary | ICD-10-CM | POA: Diagnosis not present

## 2021-11-18 DIAGNOSIS — F419 Anxiety disorder, unspecified: Secondary | ICD-10-CM | POA: Diagnosis not present

## 2021-11-18 DIAGNOSIS — Z930 Tracheostomy status: Secondary | ICD-10-CM | POA: Diagnosis not present

## 2021-11-18 DIAGNOSIS — J189 Pneumonia, unspecified organism: Secondary | ICD-10-CM | POA: Diagnosis not present

## 2021-11-18 NOTE — Progress Notes (Signed)
Pulmonary Critical Care Medicine ?[place] ?  ?PULMONARY CRITICAL CARE SERVICE ? ?PROGRESS NOTE ? ? ? ? ?Gina Petty  ?[id number]  ?DOB: [birth year]  ? ?DOA: 10/25/2021 ? ?Referring Physician: Satira Sark, MD ? ?HPI: Gina Petty is a 76 y.o. female being followed for ventilator/airway/oxygen weaning Acute on Chronic Respiratory Failure.  Patient is on T collar currently on 30% FiO2 has been using the PMV ? ?Medications: ?Reviewed on Rounds ? ?Physical Exam: ? ?Vitals: Temperature is 98.2 pulse 107 respiratory 28 blood pressure 1 3474 saturations 96% ? ?Ventilator Settings T collar FiO2 30% ? ?General: Comfortable at this time ?Neck: supple ?Cardiovascular: no malignant arrhythmias ?Respiratory: Scattered rhonchi expansion is equal ?Skin: no rash seen on limited exam ?Musculoskeletal: No gross abnormality ?Psychiatric:unable to assess ?Neurologic:no involuntary movements   ?   ?   ?Lab Data:  ? ?Basic Metabolic Panel: ?Recent Labs  ?Lab 11/12/21 ?0923 11/13/21 ?0403 11/14/21 ?1010 11/15/21 ?0414 11/16/21 ?2047  ?NA  --  137  --  136  --   ?K 3.6 3.4* 3.7 3.1* 4.0  ?CL  --  98  --  97*  --   ?CO2  --  32  --  30  --   ?GLUCOSE  --  125*  --  139*  --   ?BUN  --  24*  --  23  --   ?CREATININE  --  0.58  --  0.60  --   ?CALCIUM  --  8.9  --  8.9  --   ? ? ?ABG: ?Recent Labs  ?Lab 11/15/21 ?1830  ?PHART 7.55*  ?PCO2ART 33  ?PO2ART 77*  ?HCO3 29.1*  ?O2SAT 98.2  ? ? ?Liver Function Tests: ?Recent Labs  ?Lab 11/15/21 ?6387  ?AST 43*  ?ALT 200*  ?ALKPHOS 162*  ?BILITOT 0.4  ?PROT 5.9*  ?ALBUMIN 1.8*  ? ?No results for input(s): LIPASE, AMYLASE in the last 168 hours. ?No results for input(s): AMMONIA in the last 168 hours. ? ?CBC: ?Recent Labs  ?Lab 11/13/21 ?0403 11/15/21 ?5643  ?WBC 6.6 7.5  ?HGB 9.3* 9.8*  ?HCT 29.4* 31.6*  ?MCV 93.6 92.9  ?PLT 364 340  ? ? ?Cardiac Enzymes: ?No results for input(s): CKTOTAL, CKMB, CKMBINDEX, TROPONINI in the last 168 hours. ? ?BNP (last 3 results) ?Recent Labs   ?  10/12/21 ?0245  ?BNP 265.8*  ? ? ?ProBNP (last 3 results) ?No results for input(s): PROBNP in the last 8760 hours. ? ?Radiological Exams: ?No results found. ? ?Assessment/Plan ?Active Problems: ?  CAP (community acquired pneumonia) ?  Acute on chronic respiratory failure with hypoxia (HCC) ?  Anxiety disorder ?  Tracheostomy status ([place]) ?  COPD, severe ([place]) ? ? ?Acute on chronic respiratory failure hypoxia plan is going to be to continue with the T-piece as tolerated continue secretion management pulmonary toilet. ?Community-acquired pneumonia no change we will continue to follow ?Anxiety disorder supportive care ?Severe COPD medical management ?Tracheostomy remains in place ? ? ?I have personally seen and evaluated the patient, evaluated laboratory and imaging results, formulated the assessment and plan and placed orders. ?The Patient requires high complexity decision making with multiple systems involvement.  ?Rounds were done with the Respiratory Therapy Director and Staff therapists and discussed with nursing staff also. ? ?Allyne Gee, MD FCCP ?Pulmonary Critical Care Medicine ?Sleep Medicine ? ?

## 2021-11-19 ENCOUNTER — Other Ambulatory Visit (HOSPITAL_COMMUNITY): Payer: Medicare Other

## 2021-11-19 DIAGNOSIS — J189 Pneumonia, unspecified organism: Secondary | ICD-10-CM | POA: Diagnosis not present

## 2021-11-19 DIAGNOSIS — J449 Chronic obstructive pulmonary disease, unspecified: Secondary | ICD-10-CM | POA: Diagnosis not present

## 2021-11-19 DIAGNOSIS — Z4682 Encounter for fitting and adjustment of non-vascular catheter: Secondary | ICD-10-CM | POA: Diagnosis not present

## 2021-11-19 DIAGNOSIS — F419 Anxiety disorder, unspecified: Secondary | ICD-10-CM | POA: Diagnosis not present

## 2021-11-19 DIAGNOSIS — Z930 Tracheostomy status: Secondary | ICD-10-CM | POA: Diagnosis not present

## 2021-11-19 DIAGNOSIS — J9621 Acute and chronic respiratory failure with hypoxia: Secondary | ICD-10-CM | POA: Diagnosis not present

## 2021-11-19 LAB — CBC
HCT: 37.5 % (ref 36.0–46.0)
Hemoglobin: 11.4 g/dL — ABNORMAL LOW (ref 12.0–15.0)
MCH: 28.4 pg (ref 26.0–34.0)
MCHC: 30.4 g/dL (ref 30.0–36.0)
MCV: 93.5 fL (ref 80.0–100.0)
Platelets: 403 10*3/uL — ABNORMAL HIGH (ref 150–400)
RBC: 4.01 MIL/uL (ref 3.87–5.11)
RDW: 15.8 % — ABNORMAL HIGH (ref 11.5–15.5)
WBC: 11.3 10*3/uL — ABNORMAL HIGH (ref 4.0–10.5)
nRBC: 0 % (ref 0.0–0.2)

## 2021-11-19 LAB — COMPREHENSIVE METABOLIC PANEL
ALT: 250 U/L — ABNORMAL HIGH (ref 0–44)
AST: 157 U/L — ABNORMAL HIGH (ref 15–41)
Albumin: 2.2 g/dL — ABNORMAL LOW (ref 3.5–5.0)
Alkaline Phosphatase: 225 U/L — ABNORMAL HIGH (ref 38–126)
Anion gap: 10 (ref 5–15)
BUN: 28 mg/dL — ABNORMAL HIGH (ref 8–23)
CO2: 32 mmol/L (ref 22–32)
Calcium: 9.5 mg/dL (ref 8.9–10.3)
Chloride: 98 mmol/L (ref 98–111)
Creatinine, Ser: 0.69 mg/dL (ref 0.44–1.00)
GFR, Estimated: >60 mL/min (ref >60)
Glucose, Bld: 150 mg/dL — ABNORMAL HIGH (ref 70–99)
Potassium: 3.9 mmol/L (ref 3.5–5.1)
Sodium: 140 mmol/L (ref 135–145)
Total Bilirubin: 0.8 mg/dL (ref 0.3–1.2)
Total Protein: 7.1 g/dL (ref 6.5–8.1)

## 2021-11-19 LAB — C-REACTIVE PROTEIN: CRP: 24.6 mg/dL — ABNORMAL HIGH (ref <1.0)

## 2021-11-19 NOTE — Progress Notes (Signed)
Pulmonary Critical Care Medicine ?[place] ?  ?PULMONARY CRITICAL CARE SERVICE ? ?PROGRESS NOTE ? ? ? ? ?Gina Petty  ?[id number]  ?DOB: [birth year]  ? ?DOA: 10/25/2021 ? ?Referring Physician: Satira Sark, MD ? ?HPI: Gina Petty is a 76 y.o. female being followed for ventilator/airway/oxygen weaning Acute on Chronic Respiratory Failure.  Patient is comfortable without distress low-grade fever was noted ? ?Medications: ?Reviewed on Rounds ? ?Physical Exam: ? ?Vitals: Temperature is 99.3 pulse of 116 respiratory 29 blood pressure 118/73 saturations 97% ? ?Ventilator Settings on T collar FiO2 30% with the PMV ? ?General: Comfortable at this time ?Neck: supple ?Cardiovascular: no malignant arrhythmias ?Respiratory: Scattered rhonchi expansion is equal ?Skin: no rash seen on limited exam ?Musculoskeletal: No gross abnormality ?Psychiatric:unable to assess ?Neurologic:no involuntary movements   ?   ?   ?Lab Data:  ? ?Basic Metabolic Panel: ?Recent Labs  ?Lab 11/13/21 ?0403 11/14/21 ?1010 11/15/21 ?0414 11/16/21 ?2047 11/19/21 ?0455  ?NA 137  --  136  --  140  ?K 3.4* 3.7 3.1* 4.0 3.9  ?CL 98  --  97*  --  98  ?CO2 32  --  30  --  32  ?GLUCOSE 125*  --  139*  --  150*  ?BUN 24*  --  23  --  28*  ?CREATININE 0.58  --  0.60  --  0.69  ?CALCIUM 8.9  --  8.9  --  9.5  ? ? ?ABG: ?Recent Labs  ?Lab 11/15/21 ?1830  ?PHART 7.55*  ?PCO2ART 33  ?PO2ART 77*  ?HCO3 29.1*  ?O2SAT 98.2  ? ? ?Liver Function Tests: ?Recent Labs  ?Lab 11/15/21 ?0414 11/19/21 ?3976  ?AST 43* 157*  ?ALT 200* 250*  ?ALKPHOS 162* 225*  ?BILITOT 0.4 0.8  ?PROT 5.9* 7.1  ?ALBUMIN 1.8* 2.2*  ? ?No results for input(s): LIPASE, AMYLASE in the last 168 hours. ?No results for input(s): AMMONIA in the last 168 hours. ? ?CBC: ?Recent Labs  ?Lab 11/13/21 ?0403 11/15/21 ?0414 11/19/21 ?7341  ?WBC 6.6 7.5 11.3*  ?HGB 9.3* 9.8* 11.4*  ?HCT 29.4* 31.6* 37.5  ?MCV 93.6 92.9 93.5  ?PLT 364 340 403*  ? ? ?Cardiac Enzymes: ?No results for input(s):  CKTOTAL, CKMB, CKMBINDEX, TROPONINI in the last 168 hours. ? ?BNP (last 3 results) ?Recent Labs  ?  10/12/21 ?0245  ?BNP 265.8*  ? ? ?ProBNP (last 3 results) ?No results for input(s): PROBNP in the last 8760 hours. ? ?Radiological Exams: ?No results found. ? ?Assessment/Plan ?Active Problems: ?  CAP (community acquired pneumonia) ?  Acute on chronic respiratory failure with hypoxia (HCC) ?  Anxiety disorder ?  Tracheostomy status ([place]) ?  COPD, severe ([place]) ? ? ?Acute on chronic respiratory failure hypoxia we will continue with weaning on T collar with the PMV ?Community-acquired pneumonia has been treated with antibiotics ?Tracheostomy remains in place ?Anxiety disorder no issues at this time ?Severe COPD nebulizers as needed ? ? ?I have personally seen and evaluated the patient, evaluated laboratory and imaging results, formulated the assessment and plan and placed orders. ?The Patient requires high complexity decision making with multiple systems involvement.  ?Rounds were done with the Respiratory Therapy Director and Staff therapists and discussed with nursing staff also. ? ?Allyne Gee, MD FCCP ?Pulmonary Critical Care Medicine ?Sleep Medicine ? ?

## 2021-11-22 DIAGNOSIS — J189 Pneumonia, unspecified organism: Secondary | ICD-10-CM | POA: Diagnosis not present

## 2021-11-22 DIAGNOSIS — J9621 Acute and chronic respiratory failure with hypoxia: Secondary | ICD-10-CM | POA: Diagnosis not present

## 2021-11-22 DIAGNOSIS — F419 Anxiety disorder, unspecified: Secondary | ICD-10-CM | POA: Diagnosis not present

## 2021-11-22 DIAGNOSIS — J449 Chronic obstructive pulmonary disease, unspecified: Secondary | ICD-10-CM | POA: Diagnosis not present

## 2021-11-22 DIAGNOSIS — Z930 Tracheostomy status: Secondary | ICD-10-CM | POA: Diagnosis not present

## 2021-11-22 LAB — COMPREHENSIVE METABOLIC PANEL
ALT: 350 U/L — ABNORMAL HIGH (ref 0–44)
AST: 211 U/L — ABNORMAL HIGH (ref 15–41)
Albumin: 1.9 g/dL — ABNORMAL LOW (ref 3.5–5.0)
Alkaline Phosphatase: 281 U/L — ABNORMAL HIGH (ref 38–126)
Anion gap: 10 (ref 5–15)
BUN: 34 mg/dL — ABNORMAL HIGH (ref 8–23)
CO2: 36 mmol/L — ABNORMAL HIGH (ref 22–32)
Calcium: 9.3 mg/dL (ref 8.9–10.3)
Chloride: 96 mmol/L — ABNORMAL LOW (ref 98–111)
Creatinine, Ser: 0.62 mg/dL (ref 0.44–1.00)
GFR, Estimated: >60 mL/min (ref >60)
Glucose, Bld: 154 mg/dL — ABNORMAL HIGH (ref 70–99)
Potassium: 3.5 mmol/L (ref 3.5–5.1)
Sodium: 142 mmol/L (ref 135–145)
Total Bilirubin: 1.1 mg/dL (ref 0.3–1.2)
Total Protein: 6.9 g/dL (ref 6.5–8.1)

## 2021-11-22 LAB — CBC
HCT: 35.3 % — ABNORMAL LOW (ref 36.0–46.0)
Hemoglobin: 10.4 g/dL — ABNORMAL LOW (ref 12.0–15.0)
MCH: 27.7 pg (ref 26.0–34.0)
MCHC: 29.5 g/dL — ABNORMAL LOW (ref 30.0–36.0)
MCV: 94.1 fL (ref 80.0–100.0)
Platelets: 406 10*3/uL — ABNORMAL HIGH (ref 150–400)
RBC: 3.75 MIL/uL — ABNORMAL LOW (ref 3.87–5.11)
RDW: 16.1 % — ABNORMAL HIGH (ref 11.5–15.5)
WBC: 14 10*3/uL — ABNORMAL HIGH (ref 4.0–10.5)
nRBC: 0 % (ref 0.0–0.2)

## 2021-11-22 LAB — C-REACTIVE PROTEIN: CRP: 23.3 mg/dL — ABNORMAL HIGH (ref <1.0)

## 2021-11-22 NOTE — Progress Notes (Addendum)
Pulmonary Critical Care Medicine ?[place] ?  ?PULMONARY CRITICAL CARE SERVICE ? ?PROGRESS NOTE ? ? ? ? ?Gina Petty  ?[id number]  ?DOB: 13-May-1946  ? ?DOA: 10/25/2021 ? ?Referring Physician: Satira Sark, MD ? ?HPI: Gina Petty is a 76 y.o. female being followed for ventilator/airway/oxygen weaning Acute on Chronic Respiratory Failure. Patient seen sitting up in bed. She has been capped since the 5th of May and on 1L [place]. She has a small amount of secretions but is a good strong cough. We will place for decannulation in the AM. No acute distress, no acute overnight events. ? ?Medications: ?Reviewed on Rounds ? ?Physical Exam: ? ?Vitals: temp 98.8, pulse 109, respirations 24, bp 120/63, sp02 95% ? ?Ventilator Settings Capped and on 1L  ? ?General: Comfortable at this time ?Neck: supple ?Cardiovascular: no malignant arrhythmias ?Respiratory: bilaterally clear ?Skin: no rash seen on limited exam ?Musculoskeletal: No gross abnormality ?Psychiatric:unable to assess ?Neurologic:no involuntary movements   ?   ?   ?Lab Data:  ? ?Basic Metabolic Panel: ?Recent Labs  ?Lab 11/16/21 ?2047 11/19/21 ?0455 11/22/21 ?0411  ?NA  --  140 142  ?K 4.0 3.9 3.5  ?CL  --  98 96*  ?CO2  --  32 36*  ?GLUCOSE  --  150* 154*  ?BUN  --  28* 34*  ?CREATININE  --  0.69 0.62  ?CALCIUM  --  9.5 9.3  ? ? ?ABG: ?Recent Labs  ?Lab 11/15/21 ?1830  ?PHART 7.55*  ?PCO2ART 33  ?PO2ART 77*  ?HCO3 29.1*  ?O2SAT 98.2  ? ? ?Liver Function Tests: ?Recent Labs  ?Lab 11/19/21 ?0455 11/22/21 ?0411  ?AST 157* 211*  ?ALT 250* 350*  ?ALKPHOS 225* 281*  ?BILITOT 0.8 1.1  ?PROT 7.1 6.9  ?ALBUMIN 2.2* 1.9*  ? ?No results for input(s): LIPASE, AMYLASE in the last 168 hours. ?No results for input(s): AMMONIA in the last 168 hours. ? ?CBC: ?Recent Labs  ?Lab 11/19/21 ?0455 11/22/21 ?0411  ?WBC 11.3* 14.0*  ?HGB 11.4* 10.4*  ?HCT 37.5 35.3*  ?MCV 93.5 94.1  ?PLT 403* 406*  ? ? ?Cardiac Enzymes: ?No results for input(s): CKTOTAL, CKMB,  CKMBINDEX, TROPONINI in the last 168 hours. ? ?BNP (last 3 results) ?Recent Labs  ?  10/12/21 ?0245  ?BNP 265.8*  ? ? ?ProBNP (last 3 results) ?No results for input(s): PROBNP in the last 8760 hours. ? ?Radiological Exams: ?No results found. ? ?Assessment/Plan ?Active Problems: ?  CAP (community acquired pneumonia) ?  Acute on chronic respiratory failure with hypoxia (HCC) ?  Anxiety disorder ?  Tracheostomy status ([place]) ?  COPD, severe ([place]) ? ?Acute on chronic respiratory failure hypoxia -has been capped 48 hours, likely decannulation in the morning.  ?Community-acquired pneumonia -has been treated with antibiotics. ?Tracheostomy -remains in place. We will plan for decannulation in the AM. Continue with trach care per protocol. ?Anxiety disorder -no issues at this time. Continue with medical management ?Severe COPD - continue with nebulizers as needed. ? ? ?I have personally seen and evaluated the patient, evaluated laboratory and imaging results, formulated the assessment and plan and placed orders. ?The Patient requires high complexity decision making with multiple systems involvement.  ?Rounds were done with the Respiratory Therapy Director and Staff therapists and discussed with nursing staff also. ? ?Allyne Gee, MD FCCP ?Pulmonary Critical Care Medicine ?Sleep Medicine  ?

## 2021-11-23 ENCOUNTER — Other Ambulatory Visit (HOSPITAL_COMMUNITY): Payer: Medicare Other

## 2021-11-23 DIAGNOSIS — Z930 Tracheostomy status: Secondary | ICD-10-CM | POA: Diagnosis not present

## 2021-11-23 DIAGNOSIS — F419 Anxiety disorder, unspecified: Secondary | ICD-10-CM | POA: Diagnosis not present

## 2021-11-23 DIAGNOSIS — J9621 Acute and chronic respiratory failure with hypoxia: Secondary | ICD-10-CM | POA: Diagnosis not present

## 2021-11-23 DIAGNOSIS — J449 Chronic obstructive pulmonary disease, unspecified: Secondary | ICD-10-CM | POA: Diagnosis not present

## 2021-11-23 DIAGNOSIS — J189 Pneumonia, unspecified organism: Secondary | ICD-10-CM | POA: Diagnosis not present

## 2021-11-23 LAB — CALCIUM, IONIZED: Calcium, Ionized, Serum: 5.3 mg/dL (ref 4.5–5.6)

## 2021-11-23 NOTE — Progress Notes (Addendum)
Pulmonary Critical Care Medicine ?[place] ?  ?PULMONARY CRITICAL CARE SERVICE ? ?PROGRESS NOTE ? ? ? ? ?Gina Petty  ?BEM:[id number]  ?DOB: 11-Oct-1945  ? ?DOA: 10/25/2021 ? ?Referring Physician: Satira Sark, MD ? ?HPI: Gina Petty is a 76 y.o. female being followed for ventilator/airway/oxygen weaning Acute on Chronic Respiratory Failure.  Patient seen sitting at edge of bed, currently working with PT.  She has been capped since May 5 with 1 L nasal cannula in place.  We have placed an order for decannulation today.  Continues to have a dry cough.  No acute distress, no acute overnight events. ? ?Medications: ?Reviewed on Rounds ? ?Physical Exam: ? ?Vitals: Temp 98.2, pulse 107, respirations 23, BP 125/78, SPO2 99% ? ?Ventilator Settings capped with 1 L nasal cannula in place ? ?General: Comfortable at this time ?Neck: supple ?Cardiovascular: no malignant arrhythmias ?Respiratory: Bilaterally diminished ?Skin: no rash seen on limited exam ?Musculoskeletal: No gross abnormality ?Psychiatric:unable to assess ?Neurologic:no involuntary movements   ?   ?   ?Lab Data:  ? ?Basic Metabolic Panel: ?Recent Labs  ?Lab 11/16/21 ?2047 11/19/21 ?0455 11/22/21 ?0411  ?NA  --  140 142  ?K 4.0 3.9 3.5  ?CL  --  98 96*  ?CO2  --  32 36*  ?GLUCOSE  --  150* 154*  ?BUN  --  28* 34*  ?CREATININE  --  0.69 0.62  ?CALCIUM  --  9.5 9.3  ? ? ?ABG: ?No results for input(s): PHART, PCO2ART, PO2ART, HCO3, O2SAT in the last 168 hours. ? ?Liver Function Tests: ?Recent Labs  ?Lab 11/19/21 ?0455 11/22/21 ?0411  ?AST 157* 211*  ?ALT 250* 350*  ?ALKPHOS 225* 281*  ?BILITOT 0.8 1.1  ?PROT 7.1 6.9  ?ALBUMIN 2.2* 1.9*  ? ?No results for input(s): LIPASE, AMYLASE in the last 168 hours. ?No results for input(s): AMMONIA in the last 168 hours. ? ?CBC: ?Recent Labs  ?Lab 11/19/21 ?0455 11/22/21 ?0411  ?WBC 11.3* 14.0*  ?HGB 11.4* 10.4*  ?HCT 37.5 35.3*  ?MCV 93.5 94.1  ?PLT 403* 406*  ? ? ?Cardiac Enzymes: ?No results for input(s):  CKTOTAL, CKMB, CKMBINDEX, TROPONINI in the last 168 hours. ? ?BNP (last 3 results) ?Recent Labs  ?  10/12/21 ?0245  ?BNP 265.8*  ? ? ?ProBNP (last 3 results) ?No results for input(s): PROBNP in the last 8760 hours. ? ?Radiological Exams: ?No results found. ? ?Assessment/Plan ?Active Problems: ?  CAP (community acquired pneumonia) ?  Acute on chronic respiratory failure with hypoxia (HCC) ?  Anxiety disorder ?  Tracheostomy status ([place]) ?  COPD, severe ([place]) ? ?Acute on chronic respiratory failure hypoxia -has been capped 72 hours,.  Order for decannulation placed today. ?Community-acquired pneumonia -has been treated with antibiotics. ?Tracheostomy -remains in place.  Order for decannulation placed this AM. ?Anxiety disorder -no issues at this time. Continue with medical management ?Severe COPD - continue with nebulizers as needed. ?  ? ? ?I have personally seen and evaluated the patient, evaluated laboratory and imaging results, formulated the assessment and plan and placed orders. ?The Patient requires high complexity decision making with multiple systems involvement.  ?Rounds were done with the Respiratory Therapy Director and Staff therapists and discussed with nursing staff also. ? ?Allyne Gee, MD FCCP ?Pulmonary Critical Care Medicine ?Sleep Medicine  ?

## 2021-11-24 ENCOUNTER — Other Ambulatory Visit (HOSPITAL_COMMUNITY): Payer: Medicare Other

## 2021-11-24 DIAGNOSIS — J189 Pneumonia, unspecified organism: Secondary | ICD-10-CM | POA: Diagnosis not present

## 2021-11-24 DIAGNOSIS — J392 Other diseases of pharynx: Secondary | ICD-10-CM | POA: Diagnosis not present

## 2021-11-24 DIAGNOSIS — F419 Anxiety disorder, unspecified: Secondary | ICD-10-CM | POA: Diagnosis not present

## 2021-11-24 DIAGNOSIS — K1120 Sialoadenitis, unspecified: Secondary | ICD-10-CM | POA: Diagnosis not present

## 2021-11-24 DIAGNOSIS — J029 Acute pharyngitis, unspecified: Secondary | ICD-10-CM | POA: Diagnosis not present

## 2021-11-24 DIAGNOSIS — Z4682 Encounter for fitting and adjustment of non-vascular catheter: Secondary | ICD-10-CM | POA: Diagnosis not present

## 2021-11-24 DIAGNOSIS — J449 Chronic obstructive pulmonary disease, unspecified: Secondary | ICD-10-CM | POA: Diagnosis not present

## 2021-11-24 DIAGNOSIS — J9621 Acute and chronic respiratory failure with hypoxia: Secondary | ICD-10-CM | POA: Diagnosis not present

## 2021-11-24 DIAGNOSIS — Z930 Tracheostomy status: Secondary | ICD-10-CM | POA: Diagnosis not present

## 2021-11-24 NOTE — Progress Notes (Addendum)
Pulmonary Critical Care Medicine ?[place] ?  ?PULMONARY CRITICAL CARE SERVICE ? ?PROGRESS NOTE ? ? ? ? ?Gina Petty  ?[id number]  ?DOB: [birth year]  ? ?DOA: 10/25/2021 ? ?Referring Physician: Satira Sark, MD ? ?HPI: Gina Petty is a 76 y.o. female being followed for ventilator/airway/oxygen weaning Acute on Chronic Respiratory Failure. Patient seen lying in bed, currently on 3L [place] with stoma bandage in place. She is without respiratory distress. No acute overnight events. ? ?Medications: ?Reviewed on Rounds ? ?Physical Exam: ? ?Vitals: temp 100.3, pulse 117, respirations 26, bp 146/75, sp02 95 ? ?Ventilator Settings 3L [place] with stoma bandage in place ? ?General: Comfortable at this time ?Neck: supple ?Cardiovascular: no malignant arrhythmias ?Respiratory: Bilaterally clear ?Skin: no rash seen on limited exam ?Musculoskeletal: No gross abnormality ?Psychiatric:unable to assess ?Neurologic:no involuntary movements   ?   ?   ?Lab Data:  ? ?Basic Metabolic Panel: ?Recent Labs  ?Lab 11/19/21 ?0455 11/22/21 ?0411  ?NA 140 142  ?K 3.9 3.5  ?CL 98 96*  ?CO2 32 36*  ?GLUCOSE 150* 154*  ?BUN 28* 34*  ?CREATININE 0.69 0.62  ?CALCIUM 9.5 9.3  ? ? ?ABG: ?No results for input(s): PHART, PCO2ART, PO2ART, HCO3, O2SAT in the last 168 hours. ? ?Liver Function Tests: ?Recent Labs  ?Lab 11/19/21 ?0455 11/22/21 ?0411  ?AST 157* 211*  ?ALT 250* 350*  ?ALKPHOS 225* 281*  ?BILITOT 0.8 1.1  ?PROT 7.1 6.9  ?ALBUMIN 2.2* 1.9*  ? ?No results for input(s): LIPASE, AMYLASE in the last 168 hours. ?No results for input(s): AMMONIA in the last 168 hours. ? ?CBC: ?Recent Labs  ?Lab 11/19/21 ?0455 11/22/21 ?0411  ?WBC 11.3* 14.0*  ?HGB 11.4* 10.4*  ?HCT 37.5 35.3*  ?MCV 93.5 94.1  ?PLT 403* 406*  ? ? ?Cardiac Enzymes: ?No results for input(s): CKTOTAL, CKMB, CKMBINDEX, TROPONINI in the last 168 hours. ? ?BNP (last 3 results) ?Recent Labs  ?  10/12/21 ?0245  ?BNP 265.8*  ? ? ?ProBNP (last 3 results) ?No results for input(s):  PROBNP in the last 8760 hours. ? ?Radiological Exams: ?CT SOFT TISSUE NECK WO CONTRAST ? ?Result Date: 11/24/2021 ?CLINICAL DATA:  Swelling of left neck. EXAM: CT NECK WITHOUT CONTRAST TECHNIQUE: Multidetector CT imaging of the neck was performed following the standard protocol without intravenous contrast. RADIATION DOSE REDUCTION: This exam was performed according to the departmental dose-optimization program which includes automated exposure control, adjustment of the mA and/or kV according to patient size and/or use of iterative reconstruction technique. COMPARISON:  None Available. FINDINGS: Pharynx and larynx: Nasal enteric tube in place. Some mucosal based thickening asymmetric to the left pharynx which is contiguous with the left face inflammation. Patent airway. Tracheostomy channel. Salivary glands: Asymmetric expansion of the left parotid gland with reticulation and capsule thickening. Adjacent fat is also edematous with fat inflammation continuing into theleft deep neck and inferior towards the left submandibular space. No detected collection or ductal calcification, the distal duct is obscured by artifact from the teeth. Thyroid: Normal. Lymph nodes: No unexpected nodal enlargement or heterogeneity Vascular: Unremarkable Limited intracranial: Negative Visualized orbits: Cataract resection on the left at least. No acute finding Mastoids and visualized paranasal sinuses: Clear Skeleton: Advanced and generalized disc and facet degeneration. Long ossification of the stylohyoid ligaments. Upper chest: Centrilobular and panlobular emphysema at the apices. IMPRESSION: Left parotiditis with regional inflammation reaching the left pharynx. No visible calculi. Electronically Signed   By: Jorje Guild M.D.   On: 11/24/2021 04:42  ? ?  DG Chest Port 1 View ? ?Result Date: 11/23/2021 ?CLINICAL DATA:  Fever. EXAM: PORTABLE CHEST 1 VIEW COMPARISON:  AP chest 11/10/2021 and 10/26/2021; CT chest 10/11/2021 FINDINGS:  Interval removal of tracheostomy tube. Enteric tube descends below the diaphragm with the tip excluded by collimation. Cardiac silhouette and mediastinal contours are within normal limits. Mild calcification within aortic arch. Lucencies within the bilateral upper lungs consistent with the moderate to high-grade centrilobular emphysematous changes seen on prior CT. Mildly improved aeration of the bilateral lung bases compared to the most recent 11/10/2021 radiographs. Note is made of likely chronic linear scarring within the bilateral lung bases, however the likely superimposed pneumonia within the right-greater-than-left lung bases on prior 10/11/2021 CT appears greatly improved. There may be minimal residual pneumonia within the right-greater-than-left lung bases. No large pleural effusion is seen.  No pneumothorax. Mild dextrocurvature of the midthoracic spine. Moderate multilevel degenerative disc changes. IMPRESSION: Improved aeration of the bilateral lung bases with likely residual scarring. It is difficult to exclude mild residual pneumonia within the right-greater-than-left lung bases. Electronically Signed   By: Yvonne Kendall M.D.   On: 11/23/2021 12:09   ? ?Assessment/Plan ?Active Problems: ?  CAP (community acquired pneumonia) ?  Acute on chronic respiratory failure with hypoxia (HCC) ?  Anxiety disorder ?  Tracheostomy status ([place]) ?  COPD, severe ([place]) ? ?Acute on chronic respiratory failure hypoxia -Was decannulated yesterday and remains on 3L [place]. Stoma bandage in place. ?Community-acquired pneumonia -has been treated with antibiotics. ?Tracheostomy -Was decannulated yesterday and remains on 3L [place]. Stoma bandage in place. Continue with stoma care per protocol. ?Anxiety disorder -no issues at this time. Continue with medical management ?Severe COPD - continue with nebulizers as needed. ?  ?It is appropriate for pulmonology to sign off. We appreciate the consult and please feel free to reach out for  reconsult or for further recommendations.  ? ?I have personally seen and evaluated the patient, evaluated laboratory and imaging results, formulated the assessment and plan and placed orders. ?The Patient requires high complexity decision making with multiple systems involvement.  ?Rounds were done with the Respiratory Therapy Director and Staff therapists and discussed with nursing staff also. ? ?Allyne Gee, MD FCCP ?Pulmonary Critical Care Medicine ?Sleep Medicine  ?

## 2021-11-25 LAB — CBC
HCT: 34.6 % — ABNORMAL LOW (ref 36.0–46.0)
Hemoglobin: 10.3 g/dL — ABNORMAL LOW (ref 12.0–15.0)
MCH: 27.5 pg (ref 26.0–34.0)
MCHC: 29.8 g/dL — ABNORMAL LOW (ref 30.0–36.0)
MCV: 92.5 fL (ref 80.0–100.0)
Platelets: 378 10*3/uL (ref 150–400)
RBC: 3.74 MIL/uL — ABNORMAL LOW (ref 3.87–5.11)
RDW: 16.2 % — ABNORMAL HIGH (ref 11.5–15.5)
WBC: 30.6 10*3/uL — ABNORMAL HIGH (ref 4.0–10.5)
nRBC: 0 % (ref 0.0–0.2)

## 2021-11-25 LAB — COMPREHENSIVE METABOLIC PANEL
ALT: 193 U/L — ABNORMAL HIGH (ref 0–44)
AST: 119 U/L — ABNORMAL HIGH (ref 15–41)
Albumin: 1.7 g/dL — ABNORMAL LOW (ref 3.5–5.0)
Alkaline Phosphatase: 257 U/L — ABNORMAL HIGH (ref 38–126)
Anion gap: 10 (ref 5–15)
BUN: 44 mg/dL — ABNORMAL HIGH (ref 8–23)
CO2: 33 mmol/L — ABNORMAL HIGH (ref 22–32)
Calcium: 8.9 mg/dL (ref 8.9–10.3)
Chloride: 103 mmol/L (ref 98–111)
Creatinine, Ser: 0.99 mg/dL (ref 0.44–1.00)
GFR, Estimated: >60 mL/min (ref >60)
Glucose, Bld: 152 mg/dL — ABNORMAL HIGH (ref 70–99)
Potassium: 3.9 mmol/L (ref 3.5–5.1)
Sodium: 146 mmol/L — ABNORMAL HIGH (ref 135–145)
Total Bilirubin: 0.7 mg/dL (ref 0.3–1.2)
Total Protein: 6.6 g/dL (ref 6.5–8.1)

## 2021-11-26 LAB — CBC
HCT: 30.8 % — ABNORMAL LOW (ref 36.0–46.0)
Hemoglobin: 9.1 g/dL — ABNORMAL LOW (ref 12.0–15.0)
MCH: 27.7 pg (ref 26.0–34.0)
MCHC: 29.5 g/dL — ABNORMAL LOW (ref 30.0–36.0)
MCV: 93.9 fL (ref 80.0–100.0)
Platelets: 305 10*3/uL (ref 150–400)
RBC: 3.28 MIL/uL — ABNORMAL LOW (ref 3.87–5.11)
RDW: 16.1 % — ABNORMAL HIGH (ref 11.5–15.5)
WBC: 19.8 10*3/uL — ABNORMAL HIGH (ref 4.0–10.5)
nRBC: 0 % (ref 0.0–0.2)

## 2021-11-26 LAB — BASIC METABOLIC PANEL
Anion gap: 7 (ref 5–15)
BUN: 35 mg/dL — ABNORMAL HIGH (ref 8–23)
CO2: 35 mmol/L — ABNORMAL HIGH (ref 22–32)
Calcium: 8.3 mg/dL — ABNORMAL LOW (ref 8.9–10.3)
Chloride: 103 mmol/L (ref 98–111)
Creatinine, Ser: 0.64 mg/dL (ref 0.44–1.00)
GFR, Estimated: >60 mL/min (ref >60)
Glucose, Bld: 147 mg/dL — ABNORMAL HIGH (ref 70–99)
Potassium: 2.9 mmol/L — ABNORMAL LOW (ref 3.5–5.1)
Sodium: 145 mmol/L (ref 135–145)

## 2021-11-26 LAB — MAGNESIUM: Magnesium: 2.2 mg/dL (ref 1.7–2.4)

## 2021-11-26 LAB — POTASSIUM: Potassium: 3.1 mmol/L — ABNORMAL LOW (ref 3.5–5.1)

## 2021-11-27 LAB — POTASSIUM: Potassium: 4 mmol/L (ref 3.5–5.1)

## 2021-11-27 LAB — VANCOMYCIN, TROUGH: Vancomycin Tr: 13 ug/mL — ABNORMAL LOW (ref 15–20)

## 2021-11-28 LAB — BASIC METABOLIC PANEL
Anion gap: 8 (ref 5–15)
BUN: 16 mg/dL (ref 8–23)
CO2: 29 mmol/L (ref 22–32)
Calcium: 8.1 mg/dL — ABNORMAL LOW (ref 8.9–10.3)
Chloride: 110 mmol/L (ref 98–111)
Creatinine, Ser: 0.56 mg/dL (ref 0.44–1.00)
GFR, Estimated: >60 mL/min (ref >60)
Glucose, Bld: 118 mg/dL — ABNORMAL HIGH (ref 70–99)
Potassium: 3.7 mmol/L (ref 3.5–5.1)
Sodium: 147 mmol/L — ABNORMAL HIGH (ref 135–145)

## 2021-11-28 LAB — CBC
HCT: 31.3 % — ABNORMAL LOW (ref 36.0–46.0)
Hemoglobin: 8.8 g/dL — ABNORMAL LOW (ref 12.0–15.0)
MCH: 26.7 pg (ref 26.0–34.0)
MCHC: 28.1 g/dL — ABNORMAL LOW (ref 30.0–36.0)
MCV: 95.1 fL (ref 80.0–100.0)
Platelets: 294 10*3/uL (ref 150–400)
RBC: 3.29 MIL/uL — ABNORMAL LOW (ref 3.87–5.11)
RDW: 16.3 % — ABNORMAL HIGH (ref 11.5–15.5)
WBC: 11.4 10*3/uL — ABNORMAL HIGH (ref 4.0–10.5)
nRBC: 0 % (ref 0.0–0.2)

## 2021-11-28 LAB — MAGNESIUM: Magnesium: 1.9 mg/dL (ref 1.7–2.4)

## 2021-11-28 LAB — VANCOMYCIN, TROUGH: Vancomycin Tr: 14 ug/mL — ABNORMAL LOW (ref 15–20)

## 2021-11-28 NOTE — Consult Note (Addendum)
?          Infectious Disease Consultation ? ? ?Gina Petty  QBH:[id number]  DOB: [birth year]  DOA: 10/25/2021 ? ?Requesting physician: ?Dr. Owens Shark ? ?Reason for consultation: ?Antibiotic recommendations  ? ?History of Present Illness: ?Gina Petty is an 76 y.o. female with medical history significant for hypertension, hyperlipidemia, anxiety, depression, arthritis, history of tobacco abuse who was admitted to [facility] on 10/11/2021 when she presented with worsening respiratory failure and hypoxemia secondary to severe community-acquired pneumonia.  She clinically worsened and required transfer to ICU and intubation along with broad-spectrum antibiotics and pressors.  Spontaneous breathing trials were complicated by delirium.  She eventually required tracheostomy placement on 10/21/2021.  Once she was stable she was transferred and admitted to [facility].  After admission here she had urine cultures collected on 10/26/2021 that showed greater than 100,000 colonies per mL of Enterococcus faecalis for which she was treated.  She subsequently had a UTI.  She has received treatment with multiple rounds of antibiotics.  She recently started having neck swelling and fevers with leukocytosis.  CT of the neck without contrast done on 11/24/2021 per report left parotitis with regional inflammation reaching the left pharynx. ? ?Review of Systems:  ?As per HPI otherwise 10 point review of systems negative.  ? ?Past Medical History: ?Past Medical History:  ?Diagnosis Date  ? Arthritis   ? Depression   ? Hypertension   ? PE (pulmonary embolism)   ? posssible 74  not able to prove  ? ? ?Past Surgical History: ?Past Surgical History:  ?Procedure Laterality Date  ? BACK SURGERY  85  ? CATARACT EXTRACTION    ? COLONOSCOPY  06/12/2006 last   ? POLYPECTOMY    ? TOTAL HIP ARTHROPLASTY Right 10/08/2019  ? Procedure: RIGHT TOTAL HIP ARTHROPLASTY ANTERIOR APPROACH;  Surgeon: Melrose Nakayama, MD;  Location: [facility];   Service: Orthopedics;  Laterality: Right;  ? TOTAL KNEE ARTHROPLASTY Right 11/26/2013  ? DR DALLDORF  ? TOTAL KNEE ARTHROPLASTY Right 11/26/2013  ? Procedure: TOTAL KNEE ARTHROPLASTY;  Surgeon: [name], MD;  Location: [facility];  Service: Orthopedics;  Laterality: Right;  Right total knee arthroplasty  ? ? ?Allergies:  ?Allergies  ?Allergen Reactions  ? Penicillins Rash  ?  Details not specified ?Tolerates Ancef  ? ?Social History: ? reports that she quit smoking about 21 years ago. Her smoking use included cigarettes. She has a 25.00 pack-year smoking history. She has never used smokeless tobacco. She reports current alcohol use of about 7.0 standard drinks per week. She reports that she does not use drugs. ? ?Family History: ?Family History  ?Problem Relation Age of Onset  ? Colon cancer Brother 66  ? Colon polyps Neg Hx   ? Esophageal cancer Neg Hx   ? Prostate cancer Neg Hx   ? Rectal cancer Neg Hx   ? ? ?Physical Exam: ?Vitals: Temperature 98.4, pulse 103, respiratory 22, blood pressure 140/67, pulse oximetry 94% ? ?Constitutional: Awake, not in any acute distress. ?Head: Atraumatic, normocephalic ?Eyes: PERLA, EOMI  ?ENMT: external ears and nose appear normal, normal hearing, moist oral mucosa ?Neck: Left-sided neck swelling secondary to parotitis, decannulated, having increased secretions from the stoma ?CVS: S1-S2  ?Respiratory: Decreased breath sound lower lobes, occasional rhonchi, no wheezing ?Abdomen: soft nontender, nondistended, normal bowel sounds  ?Musculoskeletal: No edema ?Neuro: Debility with generalized weakness otherwise grossly nonfocal ?Psych: stable mood and affect, mental status ?Skin: no rashes ? ?Data reviewed:  I have personally reviewed following labs and imaging studies ?Labs:  ?CBC: ?Recent Labs  ?Lab 11/22/21 ?0411 11/25/21 ?1093 11/26/21 ?0436 11/28/21 ?2355  ?WBC 14.0* 30.6* 19.8* 11.4*  ?HGB 10.4* 10.3* 9.1* 8.8*  ?HCT 35.3* 34.6* 30.8* 31.3*  ?MCV 94.1 92.5 93.9 95.1  ?PLT 406*  378 305 294  ? ? ?Basic Metabolic Panel: ?Recent Labs  ?Lab 11/22/21 ?0411 11/25/21 ?7322 11/26/21 ?0436 11/26/21 ?1252 11/27/21 ?0007 11/28/21 ?0254  ?NA 142 146* 145  --   --  147*  ?K 3.5 3.9 2.9*   < > 4.0 3.7  ?CL 96* 103 103  --   --  110  ?CO2 36* 33* 35*  --   --  29  ?GLUCOSE 154* 152* 147*  --   --  118*  ?BUN 34* 44* 35*  --   --  16  ?CREATININE 0.62 0.99 0.64  --   --  0.56  ?CALCIUM 9.3 8.9 8.3*  --   --  8.1*  ?MG  --   --  2.2  --   --  1.9  ? < > = values in this interval not displayed.  ? ?GFR ?CrCl cannot be calculated (Unknown ideal weight.). ?Liver Function Tests: ?Recent Labs  ?Lab 11/22/21 ?0411 11/25/21 ?2706  ?AST 211* 119*  ?ALT 350* 193*  ?ALKPHOS 281* 257*  ?BILITOT 1.1 0.7  ?PROT 6.9 6.6  ?ALBUMIN 1.9* 1.7*  ? ?No results for input(s): LIPASE, AMYLASE in the last 168 hours. ?No results for input(s): AMMONIA in the last 168 hours. ?Coagulation profile ?No results for input(s): INR, PROTIME in the last 168 hours. ? ?Cardiac Enzymes: ?No results for input(s): CKTOTAL, CKMB, CKMBINDEX, TROPONINI in the last 168 hours. ?BNP: ?Invalid input(s): POCBNP ?CBG: ?No results for input(s): GLUCAP in the last 168 hours. ?D-Dimer ?No results for input(s): DDIMER in the last 72 hours. ?Hgb A1c ?No results for input(s): HGBA1C in the last 72 hours. ?Lipid Profile ?No results for input(s): CHOL, HDL, LDLCALC, TRIG, CHOLHDL, LDLDIRECT in the last 72 hours. ?Thyroid function studies ?No results for input(s): TSH, T4TOTAL, T3FREE, THYROIDAB in the last 72 hours. ? ?Invalid input(s): FREET3 ?Anemia work up ?No results for input(s): VITAMINB12, FOLATE, FERRITIN, TIBC, IRON, RETICCTPCT in the last 72 hours. ?Urinalysis ?   ?Component Value Date/Time  ? COLORURINE YELLOW 11/12/2021 1500  ? APPEARANCEUR CLEAR 11/12/2021 1500  ? LABSPEC 1.015 11/12/2021 1500  ? PHURINE 6.0 11/12/2021 1500  ? GLUCOSEU NEGATIVE 11/12/2021 1500  ? HGBUR NEGATIVE 11/12/2021 1500  ? BILIRUBINUR NEGATIVE 11/12/2021 1500  ? KETONESUR  NEGATIVE 11/12/2021 1500  ? PROTEINUR NEGATIVE 11/12/2021 1500  ? UROBILINOGEN 0.2 11/19/2013 1032  ? NITRITE NEGATIVE 11/12/2021 1500  ? LEUKOCYTESUR NEGATIVE 11/12/2021 1500  ? ? ?Sepsis Labs ?Invalid input(s): PROCALCITONIN,  WBC,  LACTICIDVEN ?Microbiology ?No results found for this or any previous visit (from the past 240 hour(s)). ? ? ?Inpatient Medications:   ?Please see MAR ? ?Radiological Exams on Admission: ?No results found. ? ?Impression/Recommendations ?Active Problems: ?Acute on chronic respiratory failure with hypoxia  ?Fever/leukocytosis ?Left parotitis ?Elevated LFTs ?COPD ?Hypernatremia ?Dysphagia/protein calorie malnutrition ? ?Acute on chronic respiratory failure with hypoxemia: Patient currently decannulated.  However, continuing to have secretions from the stoma.  She is on 3 L O2 nasal cannula.  Pulmonary following.  She previously had pneumonia and received treatment with multiple antibiotics.  Now restarted on antibiotics due to the acute parotitis.  She has dysphagia and is at risk for aspiration and aspiration  pneumonia. ? ?Acute left parotitis: Patient noted to have left-sided neck swelling with fever, leukocytosis.  CT of soft tissue of the neck showing left parotiditis with regional inflammation reaching the left pharynx.  She was on Ancef but continued to have leukocytosis therefore recommended to add IV vancomycin. Recommend to treat with IV Ancef and vancomycin.  Blood cultures from 11/10/2021 did not show any growth to date.  Continue to monitor.  Will plan to treat for duration of 10-14 days pending improvement.  Please monitor CBC, CMP while on antibiotics.  Recommend to avoid diuretics while on the vancomycin to prevent renal failure.  Monitor Vanco levels closely. ? ?Fever/leukocytosis: Likely secondary to the inflammation of the parotid.  Antibiotics and plan as mentioned above.  Continue to monitor. ? ?Elevated LFTs: Likely secondary to acute liver injury from Seroquel at the  outside facility.  Continue to monitor LFTs.  Further evaluation and management per the primary team. ? ?COPD: Continue medications and management per the primary team. ? ?Hypernatremia: She was on diuretics

## 2021-11-28 NOTE — Consult Note (Signed)
? ? ? ?Chief Complaint: ?Dysphagia ? ?Referring Physician(s): ?Satira Sark ? ?Supervising Physician: Jacqulynn Cadet ? ?Patient Status: [facility] - In-pt ? ?History of Present Illness: ?Gina Petty is a 76 y.o. female  with an extensive smoking history admitted on 3/27 severe acute respiratory failure with hypoxemia due to severe community acquired pneumonia.   ? ?Her clinical status worsened and she required transfer to ICU and intubation along with broad spectrum antibiotics and pressors.    ? ?Spontaneous breathing and wakening trials were complicated by delirium and she eventually required a tracheostomy on 10/21/21.    ? ?She was transferred to [facility] on 10/26/21. ? ?Her tracheostomy has since been removed. She has dysphagia and we are asked to place a gastrostomy tube. ? ? ?Past Medical History:  ?Diagnosis Date  ? Arthritis   ? Depression   ? Hypertension   ? PE (pulmonary embolism)   ? posssible 74  not able to prove  ? ? ?Past Surgical History:  ?Procedure Laterality Date  ? BACK SURGERY  85  ? CATARACT EXTRACTION    ? COLONOSCOPY  06/12/2006 last   ? POLYPECTOMY    ? TOTAL HIP ARTHROPLASTY Right 10/08/2019  ? Procedure: RIGHT TOTAL HIP ARTHROPLASTY ANTERIOR APPROACH;  Surgeon: Melrose Nakayama, MD;  Location: [facility];  Service: Orthopedics;  Laterality: Right;  ? TOTAL KNEE ARTHROPLASTY Right 11/26/2013  ? DR DALLDORF  ? TOTAL KNEE ARTHROPLASTY Right 11/26/2013  ? Procedure: TOTAL KNEE ARTHROPLASTY;  Surgeon: [name], MD;  Location: [place];  Service: Orthopedics;  Laterality: Right;  Right total knee arthroplasty  ? ? ?Allergies: ?Penicillins ? ?Medications: ?Prior to Admission medications   ?Medication Sig Start Date End Date Taking? Authorizing Provider  ?albuterol (PROVENTIL) (2.5 MG/3ML) 0.083% nebulizer solution Take 3 mLs (2.5 mg total) by nebulization every 2 (two) hours as needed for wheezing. 10/25/21   Gleason, Otilio Carpen, PA-C  ?arformoterol (BROVANA) 15 MCG/2ML NEBU Take 2 mLs (15 mcg total) by  nebulization 2 (two) times daily. 10/25/21   Gleason, Otilio Carpen, PA-C  ?Ascorbic Acid (VITAMIN C) 500 MG CHEW Chew 500 mg by mouth daily.    [provider]  ?aspirin 81 MG chewable tablet Chew 1 tablet (81 mg total) by mouth 2 (two) times daily. 10/09/19   Loni Dolly, PA-C  ?b complex vitamins tablet Take 1 tablet by mouth daily.    [provider]  ?budesonide (PULMICORT) 0.5 MG/2ML nebulizer solution Take 2 mLs (0.5 mg total) by nebulization 2 (two) times daily. 10/25/21   Gleason, Otilio Carpen, PA-C  ?buPROPion (WELLBUTRIN XL) 150 MG 24 hr tablet Take 150 mg by mouth daily.    [provider]  ?clonazePAM (KLONOPIN) 0.5 MG tablet Place 1 tablet (0.5 mg total) into feeding tube 2 (two) times daily. 10/25/21   Gleason, Otilio Carpen, PA-C  ?dexmedetomidine (PRECEDEX) 400 MCG/100ML SOLN Inject 35.8-107.4 mcg/hr into the vein continuous. 10/25/21   Gleason, Otilio Carpen, PA-C  ?docusate (COLACE) 50 MG/5ML liquid Place 10 mLs (100 mg total) into feeding tube 2 (two) times daily. 10/25/21   Gleason, Otilio Carpen, PA-C  ?docusate sodium (COLACE) 100 MG capsule Take 1 capsule (100 mg total) by mouth 2 (two) times daily as needed for mild constipation. 10/25/21   Gleason, Otilio Carpen, PA-C  ?enoxaparin (LOVENOX) 40 MG/0.4ML injection Inject 0.4 mLs (40 mg total) into the skin daily. 10/26/21   Gleason, Otilio Carpen, PA-C  ?furosemide (LASIX) 10 MG/ML injection Inject 4 mLs (40 mg total) into  the vein daily. 10/25/21   Gleason, Otilio Carpen, PA-C  ?haloperidol lactate (HALDOL) 5 MG/ML injection Inject 1 mL (5 mg total) into the vein every 6 (six) hours as needed. 10/25/21   Gleason, Otilio Carpen, PA-C  ?insulin aspart (NOVOLOG) 100 UNIT/ML injection Inject 2-6 Units into the skin every 4 (four) hours. 10/25/21   Gleason, Otilio Carpen, PA-C  ?ipratropium (ATROVENT) 0.02 % nebulizer solution Take 2.5 mLs (0.5 mg total) by nebulization every 2 (two) hours as needed for wheezing. 10/25/21   Gleason, Otilio Carpen, PA-C  ?lip balm (CARMEX) ointment Apply  topically as needed for lip care. 10/25/21   Gleason, Otilio Carpen, PA-C  ?liver oil-zinc oxide (DESITIN) 40 % ointment Apply topically 2 (two) times daily. 10/25/21   Gleason, Otilio Carpen, PA-C  ?Multiple Vitamin (MULTIVITAMIN WITH MINERALS) TABS tablet Take 1 tablet by mouth daily.    [provider]  ?Nutritional Supplements (FEEDING SUPPLEMENT, OSMOLITE 1.2 CAL,) LIQD Place 1,000 mLs into feeding tube continuous. 10/25/21   Gleason, Otilio Carpen, PA-C  ?Nutritional Supplements (FEEDING SUPPLEMENT, PROSOURCE TF,) liquid Place 90 mLs into feeding tube 2 (two) times daily. 10/25/21   Gleason, Otilio Carpen, PA-C  ?ondansetron (ZOFRAN) 4 MG/2ML SOLN injection Inject 2 mLs (4 mg total) into the vein every 6 (six) hours as needed for nausea. 10/25/21   Gleason, Otilio Carpen, PA-C  ?oxyCODONE (ROXICODONE) 5 MG/5ML solution Place 5 mLs (5 mg total) into feeding tube every 6 (six) hours. 10/25/21   Gleason, Otilio Carpen, PA-C  ?pantoprazole sodium (PROTONIX) 40 mg Place 40 mg into feeding tube at bedtime. 10/25/21   Gleason, Otilio Carpen, PA-C  ?polyethylene glycol (MIRALAX / GLYCOLAX) 17 g packet Place 17 g into feeding tube daily. 10/26/21   Gleason, Otilio Carpen, PA-C  ?potassium chloride (KLOR-CON) 20 MEQ packet Place 40 mEq into feeding tube daily. 10/26/21   Gleason, Otilio Carpen, PA-C  ?  ? ?Family History  ?Problem Relation Age of Onset  ? Colon cancer Brother 23  ? Colon polyps Neg Hx   ? Esophageal cancer Neg Hx   ? Prostate cancer Neg Hx   ? Rectal cancer Neg Hx   ? ? ?Social History  ? ?Socioeconomic History  ? Marital status: Single  ?  Spouse name: Not on file  ? Number of children: Not on file  ? Years of education: Not on file  ? Highest education level: Not on file  ?Occupational History  ? Not on file  ?Tobacco Use  ? Smoking status: Former  ?  Packs/day: 1.00  ?  Years: 25.00  ?  Pack years: 25.00  ?  Types: Cigarettes  ?  Quit date: 11/19/2000  ?  Years since quitting: 21.0  ? Smokeless tobacco: Never  ?Vaping Use  ? Vaping Use: Never used   ?Substance and Sexual Activity  ? Alcohol use: Yes  ?  Alcohol/week: 7.0 standard drinks  ?  Types: 7 Glasses of wine per week  ?  Comment: DAILY  ? Drug use: No  ? Sexual activity: Not on file  ?Other Topics Concern  ? Not on file  ?Social History Narrative  ? Not on file  ? ?Social Determinants of Health  ? ?Financial Resource Strain: Not on file  ?Food Insecurity: Not on file  ?Transportation Needs: Not on file  ?Physical Activity: Not on file  ?Stress: Not on file  ?Social Connections: Not on file  ? ? ?Review of Systems  ?Unable to perform ROS: Mental status change  ? ?  Physical Exam ?Constitutional:   ?   Appearance: She is ill-appearing.  ?Cardiovascular:  ?   Rate and Rhythm: Normal rate.  ?Pulmonary:  ?   Effort: Pulmonary effort is normal.  ?   Breath sounds: Normal breath sounds.  ?Abdominal:  ?   General: There is no distension.  ?   Palpations: Abdomen is soft.  ?   Tenderness: There is no abdominal tenderness.  ?Skin: ?   General: Skin is warm and dry.  ?Neurological:  ?   Mental Status: She is disoriented.  ? ? ?Imaging: ?CT SOFT TISSUE NECK WO CONTRAST ? ?Result Date: 11/24/2021 ?CLINICAL DATA:  Swelling of left neck. EXAM: CT NECK WITHOUT CONTRAST TECHNIQUE: Multidetector CT imaging of the neck was performed following the standard protocol without intravenous contrast. RADIATION DOSE REDUCTION: This exam was performed according to the departmental dose-optimization program which includes automated exposure control, adjustment of the mA and/or kV according to patient size and/or use of iterative reconstruction technique. COMPARISON:  None Available. FINDINGS: Pharynx and larynx: Nasal enteric tube in place. Some mucosal based thickening asymmetric to the left pharynx which is contiguous with the left face inflammation. Patent airway. Tracheostomy channel. Salivary glands: Asymmetric expansion of the left parotid gland with reticulation and capsule thickening. Adjacent fat is also edematous with fat  inflammation continuing into theleft deep neck and inferior towards the left submandibular space. No detected collection or ductal calcification, the distal duct is obscured by artifact from the teeth. Thyroid: Normal. L

## 2021-11-29 ENCOUNTER — Other Ambulatory Visit (HOSPITAL_COMMUNITY): Payer: Medicare Other

## 2021-11-29 DIAGNOSIS — R0602 Shortness of breath: Secondary | ICD-10-CM | POA: Diagnosis not present

## 2021-11-30 ENCOUNTER — Ambulatory Visit (HOSPITAL_COMMUNITY): Payer: Medicare Other | Attending: Internal Medicine

## 2021-11-30 DIAGNOSIS — R94138 Abnormal results of other function studies of peripheral nervous system: Secondary | ICD-10-CM | POA: Diagnosis not present

## 2021-11-30 DIAGNOSIS — R609 Edema, unspecified: Secondary | ICD-10-CM | POA: Diagnosis not present

## 2021-11-30 DIAGNOSIS — M7989 Other specified soft tissue disorders: Secondary | ICD-10-CM | POA: Diagnosis not present

## 2021-11-30 DIAGNOSIS — R600 Localized edema: Secondary | ICD-10-CM | POA: Diagnosis present

## 2021-11-30 LAB — CBC
HCT: 29.1 % — ABNORMAL LOW (ref 36.0–46.0)
Hemoglobin: 8.4 g/dL — ABNORMAL LOW (ref 12.0–15.0)
MCH: 26.8 pg (ref 26.0–34.0)
MCHC: 28.9 g/dL — ABNORMAL LOW (ref 30.0–36.0)
MCV: 93 fL (ref 80.0–100.0)
Platelets: 290 10*3/uL (ref 150–400)
RBC: 3.13 MIL/uL — ABNORMAL LOW (ref 3.87–5.11)
RDW: 17.2 % — ABNORMAL HIGH (ref 11.5–15.5)
WBC: 9.6 10*3/uL (ref 4.0–10.5)
nRBC: 0 % (ref 0.0–0.2)

## 2021-11-30 LAB — BASIC METABOLIC PANEL
Anion gap: 6 (ref 5–15)
BUN: 12 mg/dL (ref 8–23)
CO2: 27 mmol/L (ref 22–32)
Calcium: 7.8 mg/dL — ABNORMAL LOW (ref 8.9–10.3)
Chloride: 108 mmol/L (ref 98–111)
Creatinine, Ser: 0.61 mg/dL (ref 0.44–1.00)
GFR, Estimated: >60 mL/min (ref >60)
Glucose, Bld: 125 mg/dL — ABNORMAL HIGH (ref 70–99)
Potassium: 3.7 mmol/L (ref 3.5–5.1)
Sodium: 141 mmol/L (ref 135–145)

## 2021-11-30 LAB — VANCOMYCIN, TROUGH: Vancomycin Tr: 10 ug/mL — ABNORMAL LOW (ref 15–20)

## 2021-12-01 ENCOUNTER — Other Ambulatory Visit (HOSPITAL_COMMUNITY): Payer: Medicare Other

## 2021-12-01 DIAGNOSIS — R1310 Dysphagia, unspecified: Secondary | ICD-10-CM | POA: Diagnosis not present

## 2021-12-01 HISTORY — PX: IR GASTROSTOMY TUBE MOD SED: IMG625

## 2021-12-01 LAB — PROTIME-INR
INR: 1.4 — ABNORMAL HIGH (ref 0.8–1.2)
Prothrombin Time: 16.6 seconds — ABNORMAL HIGH (ref 11.4–15.2)

## 2021-12-01 MED ORDER — VANCOMYCIN HCL IN DEXTROSE 1-5 GM/200ML-% IV SOLN
INTRAVENOUS | Status: AC
  Administered 2021-12-01: 1000 mg via INTRAVENOUS
  Filled 2021-12-01: qty 200

## 2021-12-01 MED ORDER — IOHEXOL 300 MG/ML  SOLN
100.0000 mL | Freq: Once | INTRAMUSCULAR | Status: AC | PRN
Start: 2021-12-01 — End: 2021-12-01
  Administered 2021-12-01: 10 mL

## 2021-12-01 MED ORDER — FENTANYL CITRATE (PF) 100 MCG/2ML IJ SOLN
INTRAMUSCULAR | Status: AC
  Filled 2021-12-01: qty 2

## 2021-12-01 MED ORDER — VANCOMYCIN HCL IN DEXTROSE 1-5 GM/200ML-% IV SOLN: 1000.0000 mg | Freq: Once | INTRAVENOUS | Status: AC

## 2021-12-01 MED ORDER — LIDOCAINE HCL 1 % IJ SOLN
INTRAMUSCULAR | Status: AC
  Filled 2021-12-01: qty 20

## 2021-12-01 MED ORDER — MIDAZOLAM HCL 2 MG/2ML IJ SOLN
INTRAMUSCULAR | Status: AC
Start: 2021-12-01 — End: 2021-12-02
  Filled 2021-12-01: qty 2

## 2021-12-01 MED ORDER — FENTANYL CITRATE (PF) 100 MCG/2ML IJ SOLN
INTRAMUSCULAR | Status: AC | PRN
  Administered 2021-12-01 (×2): 25 ug via INTRAVENOUS

## 2021-12-01 MED ORDER — MIDAZOLAM HCL 2 MG/2ML IJ SOLN
INTRAMUSCULAR | Status: AC | PRN
Start: 2021-12-01 — End: 2021-12-01
  Administered 2021-12-01: 1 mg via INTRAVENOUS
  Administered 2021-12-01: .5 mg via INTRAVENOUS

## 2021-12-01 NOTE — Procedures (Signed)
Interventional Radiology Procedure Note  Procedure: Placement of percutaneous 20F pull-through gastrostomy tube. Complications: None Recommendations: - NPO except for sips and chips remainder of today and overnight - Maintain G-tube to LWS until tomorrow morning  - May advance diet as tolerated and begin using tube tomorrow morning  Signed,   Bertel Venard S. Shady Padron, DO   

## 2021-12-03 LAB — BASIC METABOLIC PANEL
Anion gap: 4 — ABNORMAL LOW (ref 5–15)
BUN: 15 mg/dL (ref 8–23)
CO2: 27 mmol/L (ref 22–32)
Calcium: 8.2 mg/dL — ABNORMAL LOW (ref 8.9–10.3)
Chloride: 107 mmol/L (ref 98–111)
Creatinine, Ser: 0.48 mg/dL (ref 0.44–1.00)
GFR, Estimated: >60 mL/min (ref >60)
Glucose, Bld: 131 mg/dL — ABNORMAL HIGH (ref 70–99)
Potassium: 4.3 mmol/L (ref 3.5–5.1)
Sodium: 138 mmol/L (ref 135–145)

## 2021-12-03 LAB — CBC
HCT: 27.8 % — ABNORMAL LOW (ref 36.0–46.0)
Hemoglobin: 8.1 g/dL — ABNORMAL LOW (ref 12.0–15.0)
MCH: 26.8 pg (ref 26.0–34.0)
MCHC: 29.1 g/dL — ABNORMAL LOW (ref 30.0–36.0)
MCV: 92.1 fL (ref 80.0–100.0)
Platelets: 326 10*3/uL (ref 150–400)
RBC: 3.02 MIL/uL — ABNORMAL LOW (ref 3.87–5.11)
RDW: 17.4 % — ABNORMAL HIGH (ref 11.5–15.5)
WBC: 11.7 10*3/uL — ABNORMAL HIGH (ref 4.0–10.5)
nRBC: 0 % (ref 0.0–0.2)

## 2021-12-03 LAB — C-REACTIVE PROTEIN: CRP: 16.8 mg/dL — ABNORMAL HIGH (ref <1.0)

## 2021-12-06 LAB — COMPREHENSIVE METABOLIC PANEL
ALT: 60 U/L — ABNORMAL HIGH (ref 0–44)
AST: 39 U/L (ref 15–41)
Albumin: <1.5 g/dL — ABNORMAL LOW (ref 3.5–5.0)
Alkaline Phosphatase: 138 U/L — ABNORMAL HIGH (ref 38–126)
Anion gap: 4 — ABNORMAL LOW (ref 5–15)
BUN: 14 mg/dL (ref 8–23)
CO2: 28 mmol/L (ref 22–32)
Calcium: 8.2 mg/dL — ABNORMAL LOW (ref 8.9–10.3)
Chloride: 104 mmol/L (ref 98–111)
Creatinine, Ser: 0.57 mg/dL (ref 0.44–1.00)
GFR, Estimated: >60 mL/min (ref >60)
Glucose, Bld: 121 mg/dL — ABNORMAL HIGH (ref 70–99)
Potassium: 3.9 mmol/L (ref 3.5–5.1)
Sodium: 136 mmol/L (ref 135–145)
Total Bilirubin: 0.4 mg/dL (ref 0.3–1.2)
Total Protein: 5.6 g/dL — ABNORMAL LOW (ref 6.5–8.1)

## 2021-12-06 LAB — CBC
HCT: 26.4 % — ABNORMAL LOW (ref 36.0–46.0)
Hemoglobin: 7.7 g/dL — ABNORMAL LOW (ref 12.0–15.0)
MCH: 26.3 pg (ref 26.0–34.0)
MCHC: 29.2 g/dL — ABNORMAL LOW (ref 30.0–36.0)
MCV: 90.1 fL (ref 80.0–100.0)
Platelets: 363 10*3/uL (ref 150–400)
RBC: 2.93 MIL/uL — ABNORMAL LOW (ref 3.87–5.11)
RDW: 17.4 % — ABNORMAL HIGH (ref 11.5–15.5)
WBC: 7.7 10*3/uL (ref 4.0–10.5)
nRBC: 0 % (ref 0.0–0.2)

## 2021-12-06 LAB — C-REACTIVE PROTEIN: CRP: 9.3 mg/dL — ABNORMAL HIGH (ref <1.0)

## 2021-12-07 DIAGNOSIS — W19XXXA Unspecified fall, initial encounter: Secondary | ICD-10-CM | POA: Diagnosis not present

## 2021-12-07 DIAGNOSIS — R197 Diarrhea, unspecified: Secondary | ICD-10-CM | POA: Diagnosis not present

## 2021-12-07 DIAGNOSIS — M79605 Pain in left leg: Secondary | ICD-10-CM | POA: Diagnosis not present

## 2021-12-07 DIAGNOSIS — R251 Tremor, unspecified: Secondary | ICD-10-CM | POA: Diagnosis not present

## 2021-12-07 DIAGNOSIS — E46 Unspecified protein-calorie malnutrition: Secondary | ICD-10-CM | POA: Diagnosis not present

## 2021-12-07 DIAGNOSIS — M2550 Pain in unspecified joint: Secondary | ICD-10-CM | POA: Diagnosis not present

## 2021-12-07 DIAGNOSIS — K5900 Constipation, unspecified: Secondary | ICD-10-CM | POA: Diagnosis not present

## 2021-12-07 DIAGNOSIS — H9392 Unspecified disorder of left ear: Secondary | ICD-10-CM | POA: Diagnosis not present

## 2021-12-07 DIAGNOSIS — G4700 Insomnia, unspecified: Secondary | ICD-10-CM | POA: Diagnosis not present

## 2021-12-07 DIAGNOSIS — R0989 Other specified symptoms and signs involving the circulatory and respiratory systems: Secondary | ICD-10-CM | POA: Diagnosis not present

## 2021-12-07 DIAGNOSIS — J159 Unspecified bacterial pneumonia: Secondary | ICD-10-CM | POA: Diagnosis not present

## 2021-12-07 DIAGNOSIS — M1711 Unilateral primary osteoarthritis, right knee: Secondary | ICD-10-CM | POA: Diagnosis not present

## 2021-12-07 DIAGNOSIS — K1120 Sialoadenitis, unspecified: Secondary | ICD-10-CM | POA: Diagnosis not present

## 2021-12-07 DIAGNOSIS — M1611 Unilateral primary osteoarthritis, right hip: Secondary | ICD-10-CM | POA: Diagnosis not present

## 2021-12-07 DIAGNOSIS — R079 Chest pain, unspecified: Secondary | ICD-10-CM | POA: Diagnosis not present

## 2021-12-07 DIAGNOSIS — G894 Chronic pain syndrome: Secondary | ICD-10-CM | POA: Diagnosis not present

## 2021-12-07 DIAGNOSIS — M25512 Pain in left shoulder: Secondary | ICD-10-CM | POA: Diagnosis not present

## 2021-12-07 DIAGNOSIS — R059 Cough, unspecified: Secondary | ICD-10-CM | POA: Diagnosis not present

## 2021-12-07 DIAGNOSIS — R279 Unspecified lack of coordination: Secondary | ICD-10-CM | POA: Diagnosis not present

## 2021-12-07 DIAGNOSIS — Z1159 Encounter for screening for other viral diseases: Secondary | ICD-10-CM | POA: Diagnosis not present

## 2021-12-07 DIAGNOSIS — D649 Anemia, unspecified: Secondary | ICD-10-CM | POA: Diagnosis not present

## 2021-12-07 DIAGNOSIS — M79642 Pain in left hand: Secondary | ICD-10-CM | POA: Diagnosis not present

## 2021-12-07 DIAGNOSIS — F05 Delirium due to known physiological condition: Secondary | ICD-10-CM | POA: Diagnosis not present

## 2021-12-07 DIAGNOSIS — R7989 Other specified abnormal findings of blood chemistry: Secondary | ICD-10-CM | POA: Diagnosis not present

## 2021-12-07 DIAGNOSIS — M79604 Pain in right leg: Secondary | ICD-10-CM | POA: Diagnosis not present

## 2021-12-07 DIAGNOSIS — R52 Pain, unspecified: Secondary | ICD-10-CM | POA: Diagnosis not present

## 2021-12-07 DIAGNOSIS — Z431 Encounter for attention to gastrostomy: Secondary | ICD-10-CM | POA: Diagnosis present

## 2021-12-07 DIAGNOSIS — D110 Benign neoplasm of parotid gland: Secondary | ICD-10-CM | POA: Diagnosis not present

## 2021-12-07 DIAGNOSIS — M25562 Pain in left knee: Secondary | ICD-10-CM | POA: Diagnosis not present

## 2021-12-07 DIAGNOSIS — L210 Seborrhea capitis: Secondary | ICD-10-CM | POA: Diagnosis not present

## 2021-12-07 DIAGNOSIS — F3289 Other specified depressive episodes: Secondary | ICD-10-CM | POA: Diagnosis not present

## 2021-12-07 DIAGNOSIS — J449 Chronic obstructive pulmonary disease, unspecified: Secondary | ICD-10-CM | POA: Diagnosis not present

## 2021-12-07 DIAGNOSIS — I10 Essential (primary) hypertension: Secondary | ICD-10-CM | POA: Diagnosis not present

## 2021-12-07 DIAGNOSIS — Z743 Need for continuous supervision: Secondary | ICD-10-CM | POA: Diagnosis not present

## 2021-12-07 DIAGNOSIS — E870 Hyperosmolality and hypernatremia: Secondary | ICD-10-CM | POA: Diagnosis not present

## 2021-12-07 DIAGNOSIS — R29898 Other symptoms and signs involving the musculoskeletal system: Secondary | ICD-10-CM | POA: Diagnosis not present

## 2021-12-07 DIAGNOSIS — S31109D Unspecified open wound of abdominal wall, unspecified quadrant without penetration into peritoneal cavity, subsequent encounter: Secondary | ICD-10-CM | POA: Diagnosis not present

## 2021-12-07 DIAGNOSIS — Z931 Gastrostomy status: Secondary | ICD-10-CM | POA: Diagnosis not present

## 2021-12-07 DIAGNOSIS — R5381 Other malaise: Secondary | ICD-10-CM | POA: Diagnosis not present

## 2021-12-07 DIAGNOSIS — G510 Bell's palsy: Secondary | ICD-10-CM | POA: Diagnosis not present

## 2021-12-07 DIAGNOSIS — R443 Hallucinations, unspecified: Secondary | ICD-10-CM | POA: Diagnosis not present

## 2021-12-07 DIAGNOSIS — M25572 Pain in left ankle and joints of left foot: Secondary | ICD-10-CM | POA: Diagnosis not present

## 2021-12-07 DIAGNOSIS — R1312 Dysphagia, oropharyngeal phase: Secondary | ICD-10-CM | POA: Diagnosis not present

## 2021-12-07 DIAGNOSIS — S31109A Unspecified open wound of abdominal wall, unspecified quadrant without penetration into peritoneal cavity, initial encounter: Secondary | ICD-10-CM | POA: Diagnosis not present

## 2021-12-07 DIAGNOSIS — E785 Hyperlipidemia, unspecified: Secondary | ICD-10-CM | POA: Diagnosis not present

## 2021-12-07 DIAGNOSIS — M6281 Muscle weakness (generalized): Secondary | ICD-10-CM | POA: Diagnosis not present

## 2021-12-07 DIAGNOSIS — J9601 Acute respiratory failure with hypoxia: Secondary | ICD-10-CM | POA: Diagnosis not present

## 2021-12-07 DIAGNOSIS — R531 Weakness: Secondary | ICD-10-CM | POA: Diagnosis not present

## 2021-12-07 DIAGNOSIS — R051 Acute cough: Secondary | ICD-10-CM | POA: Diagnosis not present

## 2021-12-07 DIAGNOSIS — R410 Disorientation, unspecified: Secondary | ICD-10-CM | POA: Diagnosis not present

## 2021-12-07 DIAGNOSIS — K7200 Acute and subacute hepatic failure without coma: Secondary | ICD-10-CM | POA: Diagnosis not present

## 2021-12-07 DIAGNOSIS — N183 Chronic kidney disease, stage 3 unspecified: Secondary | ICD-10-CM | POA: Diagnosis not present

## 2021-12-07 DIAGNOSIS — Z7902 Long term (current) use of antithrombotics/antiplatelets: Secondary | ICD-10-CM | POA: Diagnosis not present

## 2021-12-07 DIAGNOSIS — L03114 Cellulitis of left upper limb: Secondary | ICD-10-CM | POA: Diagnosis not present

## 2021-12-07 DIAGNOSIS — F32A Depression, unspecified: Secondary | ICD-10-CM | POA: Diagnosis not present

## 2021-12-07 DIAGNOSIS — M25462 Effusion, left knee: Secondary | ICD-10-CM | POA: Diagnosis not present

## 2021-12-07 DIAGNOSIS — R509 Fever, unspecified: Secondary | ICD-10-CM | POA: Diagnosis not present

## 2021-12-07 DIAGNOSIS — U071 COVID-19: Secondary | ICD-10-CM | POA: Diagnosis not present

## 2021-12-07 DIAGNOSIS — F419 Anxiety disorder, unspecified: Secondary | ICD-10-CM | POA: Diagnosis not present

## 2021-12-07 DIAGNOSIS — R600 Localized edema: Secondary | ICD-10-CM | POA: Diagnosis not present

## 2021-12-08 DIAGNOSIS — M6281 Muscle weakness (generalized): Secondary | ICD-10-CM | POA: Diagnosis not present

## 2021-12-08 DIAGNOSIS — K5900 Constipation, unspecified: Secondary | ICD-10-CM | POA: Diagnosis not present

## 2021-12-08 DIAGNOSIS — I10 Essential (primary) hypertension: Secondary | ICD-10-CM | POA: Diagnosis not present

## 2021-12-08 DIAGNOSIS — R1312 Dysphagia, oropharyngeal phase: Secondary | ICD-10-CM | POA: Diagnosis not present

## 2021-12-08 DIAGNOSIS — F419 Anxiety disorder, unspecified: Secondary | ICD-10-CM | POA: Diagnosis not present

## 2021-12-08 DIAGNOSIS — G894 Chronic pain syndrome: Secondary | ICD-10-CM | POA: Diagnosis not present

## 2021-12-08 DIAGNOSIS — Z931 Gastrostomy status: Secondary | ICD-10-CM | POA: Diagnosis not present

## 2021-12-08 DIAGNOSIS — J449 Chronic obstructive pulmonary disease, unspecified: Secondary | ICD-10-CM | POA: Diagnosis not present

## 2021-12-09 DIAGNOSIS — J9601 Acute respiratory failure with hypoxia: Secondary | ICD-10-CM | POA: Diagnosis not present

## 2021-12-09 DIAGNOSIS — R1312 Dysphagia, oropharyngeal phase: Secondary | ICD-10-CM | POA: Diagnosis not present

## 2021-12-09 DIAGNOSIS — D110 Benign neoplasm of parotid gland: Secondary | ICD-10-CM | POA: Diagnosis not present

## 2021-12-09 DIAGNOSIS — J449 Chronic obstructive pulmonary disease, unspecified: Secondary | ICD-10-CM | POA: Diagnosis not present

## 2021-12-14 DIAGNOSIS — R600 Localized edema: Secondary | ICD-10-CM | POA: Diagnosis not present

## 2021-12-14 DIAGNOSIS — R0989 Other specified symptoms and signs involving the circulatory and respiratory systems: Secondary | ICD-10-CM | POA: Diagnosis not present

## 2021-12-14 DIAGNOSIS — G510 Bell's palsy: Secondary | ICD-10-CM | POA: Diagnosis not present

## 2021-12-14 DIAGNOSIS — G4700 Insomnia, unspecified: Secondary | ICD-10-CM | POA: Diagnosis not present

## 2021-12-14 DIAGNOSIS — R051 Acute cough: Secondary | ICD-10-CM | POA: Diagnosis not present

## 2021-12-14 DIAGNOSIS — K1120 Sialoadenitis, unspecified: Secondary | ICD-10-CM | POA: Diagnosis not present

## 2021-12-15 DIAGNOSIS — H9392 Unspecified disorder of left ear: Secondary | ICD-10-CM | POA: Diagnosis not present

## 2021-12-15 DIAGNOSIS — G510 Bell's palsy: Secondary | ICD-10-CM | POA: Diagnosis not present

## 2021-12-15 DIAGNOSIS — R600 Localized edema: Secondary | ICD-10-CM | POA: Diagnosis not present

## 2021-12-15 DIAGNOSIS — K1120 Sialoadenitis, unspecified: Secondary | ICD-10-CM | POA: Diagnosis not present

## 2021-12-17 DIAGNOSIS — G510 Bell's palsy: Secondary | ICD-10-CM | POA: Diagnosis not present

## 2021-12-17 DIAGNOSIS — R1312 Dysphagia, oropharyngeal phase: Secondary | ICD-10-CM | POA: Diagnosis not present

## 2021-12-17 DIAGNOSIS — M6281 Muscle weakness (generalized): Secondary | ICD-10-CM | POA: Diagnosis not present

## 2021-12-20 DIAGNOSIS — R1312 Dysphagia, oropharyngeal phase: Secondary | ICD-10-CM | POA: Diagnosis not present

## 2021-12-20 DIAGNOSIS — G510 Bell's palsy: Secondary | ICD-10-CM | POA: Diagnosis not present

## 2021-12-20 DIAGNOSIS — M79642 Pain in left hand: Secondary | ICD-10-CM | POA: Diagnosis not present

## 2021-12-21 DIAGNOSIS — R600 Localized edema: Secondary | ICD-10-CM | POA: Diagnosis not present

## 2021-12-21 DIAGNOSIS — R0989 Other specified symptoms and signs involving the circulatory and respiratory systems: Secondary | ICD-10-CM | POA: Diagnosis not present

## 2021-12-21 DIAGNOSIS — M79642 Pain in left hand: Secondary | ICD-10-CM | POA: Diagnosis not present

## 2021-12-21 DIAGNOSIS — R051 Acute cough: Secondary | ICD-10-CM | POA: Diagnosis not present

## 2021-12-21 DIAGNOSIS — R1312 Dysphagia, oropharyngeal phase: Secondary | ICD-10-CM | POA: Diagnosis not present

## 2021-12-21 DIAGNOSIS — L03114 Cellulitis of left upper limb: Secondary | ICD-10-CM | POA: Diagnosis not present

## 2021-12-22 DIAGNOSIS — L03114 Cellulitis of left upper limb: Secondary | ICD-10-CM | POA: Diagnosis not present

## 2021-12-22 DIAGNOSIS — J449 Chronic obstructive pulmonary disease, unspecified: Secondary | ICD-10-CM | POA: Diagnosis not present

## 2021-12-22 DIAGNOSIS — M79642 Pain in left hand: Secondary | ICD-10-CM | POA: Diagnosis not present

## 2021-12-22 DIAGNOSIS — R051 Acute cough: Secondary | ICD-10-CM | POA: Diagnosis not present

## 2021-12-23 DIAGNOSIS — M6281 Muscle weakness (generalized): Secondary | ICD-10-CM | POA: Diagnosis not present

## 2021-12-23 DIAGNOSIS — L03114 Cellulitis of left upper limb: Secondary | ICD-10-CM | POA: Diagnosis not present

## 2021-12-23 DIAGNOSIS — R443 Hallucinations, unspecified: Secondary | ICD-10-CM | POA: Diagnosis not present

## 2021-12-23 DIAGNOSIS — G894 Chronic pain syndrome: Secondary | ICD-10-CM | POA: Diagnosis not present

## 2021-12-23 DIAGNOSIS — F05 Delirium due to known physiological condition: Secondary | ICD-10-CM | POA: Diagnosis not present

## 2021-12-23 DIAGNOSIS — M79642 Pain in left hand: Secondary | ICD-10-CM | POA: Diagnosis not present

## 2021-12-24 DIAGNOSIS — R1312 Dysphagia, oropharyngeal phase: Secondary | ICD-10-CM | POA: Diagnosis not present

## 2021-12-24 DIAGNOSIS — R051 Acute cough: Secondary | ICD-10-CM | POA: Diagnosis not present

## 2021-12-24 DIAGNOSIS — R5381 Other malaise: Secondary | ICD-10-CM | POA: Diagnosis not present

## 2021-12-24 DIAGNOSIS — R509 Fever, unspecified: Secondary | ICD-10-CM | POA: Diagnosis not present

## 2021-12-27 DIAGNOSIS — R0989 Other specified symptoms and signs involving the circulatory and respiratory systems: Secondary | ICD-10-CM | POA: Diagnosis not present

## 2021-12-27 DIAGNOSIS — M6281 Muscle weakness (generalized): Secondary | ICD-10-CM | POA: Diagnosis not present

## 2021-12-27 DIAGNOSIS — R1312 Dysphagia, oropharyngeal phase: Secondary | ICD-10-CM | POA: Diagnosis not present

## 2021-12-27 DIAGNOSIS — R051 Acute cough: Secondary | ICD-10-CM | POA: Diagnosis not present

## 2021-12-27 DIAGNOSIS — R7989 Other specified abnormal findings of blood chemistry: Secondary | ICD-10-CM | POA: Diagnosis not present

## 2021-12-29 DIAGNOSIS — M25512 Pain in left shoulder: Secondary | ICD-10-CM | POA: Diagnosis not present

## 2021-12-29 DIAGNOSIS — M79642 Pain in left hand: Secondary | ICD-10-CM | POA: Diagnosis not present

## 2022-01-04 DIAGNOSIS — R0989 Other specified symptoms and signs involving the circulatory and respiratory systems: Secondary | ICD-10-CM | POA: Diagnosis not present

## 2022-01-04 DIAGNOSIS — R251 Tremor, unspecified: Secondary | ICD-10-CM | POA: Diagnosis not present

## 2022-01-04 DIAGNOSIS — R051 Acute cough: Secondary | ICD-10-CM | POA: Diagnosis not present

## 2022-01-04 DIAGNOSIS — J449 Chronic obstructive pulmonary disease, unspecified: Secondary | ICD-10-CM | POA: Diagnosis not present

## 2022-01-04 DIAGNOSIS — M6281 Muscle weakness (generalized): Secondary | ICD-10-CM | POA: Diagnosis not present

## 2022-01-04 DIAGNOSIS — R5381 Other malaise: Secondary | ICD-10-CM | POA: Diagnosis not present

## 2022-01-04 DIAGNOSIS — R1312 Dysphagia, oropharyngeal phase: Secondary | ICD-10-CM | POA: Diagnosis not present

## 2022-01-05 DIAGNOSIS — M6281 Muscle weakness (generalized): Secondary | ICD-10-CM | POA: Diagnosis not present

## 2022-01-05 DIAGNOSIS — R0989 Other specified symptoms and signs involving the circulatory and respiratory systems: Secondary | ICD-10-CM | POA: Diagnosis not present

## 2022-01-05 DIAGNOSIS — F419 Anxiety disorder, unspecified: Secondary | ICD-10-CM | POA: Diagnosis not present

## 2022-01-05 DIAGNOSIS — R251 Tremor, unspecified: Secondary | ICD-10-CM | POA: Diagnosis not present

## 2022-01-05 DIAGNOSIS — R5381 Other malaise: Secondary | ICD-10-CM | POA: Diagnosis not present

## 2022-01-06 DIAGNOSIS — R29898 Other symptoms and signs involving the musculoskeletal system: Secondary | ICD-10-CM | POA: Diagnosis not present

## 2022-01-06 DIAGNOSIS — R5381 Other malaise: Secondary | ICD-10-CM | POA: Diagnosis not present

## 2022-01-06 DIAGNOSIS — R251 Tremor, unspecified: Secondary | ICD-10-CM | POA: Diagnosis not present

## 2022-01-07 DIAGNOSIS — R251 Tremor, unspecified: Secondary | ICD-10-CM | POA: Diagnosis not present

## 2022-01-07 DIAGNOSIS — R29898 Other symptoms and signs involving the musculoskeletal system: Secondary | ICD-10-CM | POA: Diagnosis not present

## 2022-01-07 DIAGNOSIS — F32A Depression, unspecified: Secondary | ICD-10-CM | POA: Diagnosis not present

## 2022-01-10 ENCOUNTER — Other Ambulatory Visit: Payer: Self-pay | Admitting: Family Medicine

## 2022-01-10 DIAGNOSIS — R1312 Dysphagia, oropharyngeal phase: Secondary | ICD-10-CM | POA: Diagnosis not present

## 2022-01-10 DIAGNOSIS — F419 Anxiety disorder, unspecified: Secondary | ICD-10-CM | POA: Diagnosis not present

## 2022-01-10 DIAGNOSIS — M6281 Muscle weakness (generalized): Secondary | ICD-10-CM | POA: Diagnosis not present

## 2022-01-10 DIAGNOSIS — R29898 Other symptoms and signs involving the musculoskeletal system: Secondary | ICD-10-CM | POA: Diagnosis not present

## 2022-01-10 DIAGNOSIS — F32A Depression, unspecified: Secondary | ICD-10-CM | POA: Diagnosis not present

## 2022-01-10 DIAGNOSIS — R251 Tremor, unspecified: Secondary | ICD-10-CM | POA: Diagnosis not present

## 2022-01-12 ENCOUNTER — Other Ambulatory Visit: Payer: Self-pay | Admitting: *Deleted

## 2022-01-12 DIAGNOSIS — R1312 Dysphagia, oropharyngeal phase: Secondary | ICD-10-CM | POA: Diagnosis not present

## 2022-01-12 DIAGNOSIS — R29898 Other symptoms and signs involving the musculoskeletal system: Secondary | ICD-10-CM | POA: Diagnosis not present

## 2022-01-12 DIAGNOSIS — R251 Tremor, unspecified: Secondary | ICD-10-CM | POA: Diagnosis not present

## 2022-01-12 NOTE — Patient Outreach (Signed)

## 2022-01-14 DIAGNOSIS — L210 Seborrhea capitis: Secondary | ICD-10-CM | POA: Diagnosis not present

## 2022-01-14 DIAGNOSIS — R29898 Other symptoms and signs involving the musculoskeletal system: Secondary | ICD-10-CM | POA: Diagnosis not present

## 2022-01-14 DIAGNOSIS — R1312 Dysphagia, oropharyngeal phase: Secondary | ICD-10-CM | POA: Diagnosis not present

## 2022-01-14 DIAGNOSIS — Z931 Gastrostomy status: Secondary | ICD-10-CM | POA: Diagnosis not present

## 2022-01-17 DIAGNOSIS — M6281 Muscle weakness (generalized): Secondary | ICD-10-CM | POA: Diagnosis not present

## 2022-01-17 DIAGNOSIS — R1312 Dysphagia, oropharyngeal phase: Secondary | ICD-10-CM | POA: Diagnosis not present

## 2022-01-17 DIAGNOSIS — Z931 Gastrostomy status: Secondary | ICD-10-CM | POA: Diagnosis not present

## 2022-01-19 ENCOUNTER — Other Ambulatory Visit (HOSPITAL_COMMUNITY): Payer: Self-pay | Admitting: Family Medicine

## 2022-01-19 DIAGNOSIS — R051 Acute cough: Secondary | ICD-10-CM | POA: Diagnosis not present

## 2022-01-19 DIAGNOSIS — R1312 Dysphagia, oropharyngeal phase: Secondary | ICD-10-CM | POA: Diagnosis not present

## 2022-01-19 DIAGNOSIS — E46 Unspecified protein-calorie malnutrition: Secondary | ICD-10-CM | POA: Diagnosis not present

## 2022-01-19 DIAGNOSIS — Z431 Encounter for attention to gastrostomy: Secondary | ICD-10-CM

## 2022-01-19 DIAGNOSIS — R0989 Other specified symptoms and signs involving the circulatory and respiratory systems: Secondary | ICD-10-CM | POA: Diagnosis not present

## 2022-01-20 DIAGNOSIS — Z931 Gastrostomy status: Secondary | ICD-10-CM | POA: Diagnosis not present

## 2022-01-20 DIAGNOSIS — R29898 Other symptoms and signs involving the musculoskeletal system: Secondary | ICD-10-CM | POA: Diagnosis not present

## 2022-01-20 DIAGNOSIS — R1312 Dysphagia, oropharyngeal phase: Secondary | ICD-10-CM | POA: Diagnosis not present

## 2022-01-20 DIAGNOSIS — M79605 Pain in left leg: Secondary | ICD-10-CM | POA: Diagnosis not present

## 2022-01-20 DIAGNOSIS — J449 Chronic obstructive pulmonary disease, unspecified: Secondary | ICD-10-CM | POA: Diagnosis not present

## 2022-01-20 DIAGNOSIS — R0989 Other specified symptoms and signs involving the circulatory and respiratory systems: Secondary | ICD-10-CM | POA: Diagnosis not present

## 2022-01-20 DIAGNOSIS — M79604 Pain in right leg: Secondary | ICD-10-CM | POA: Diagnosis not present

## 2022-01-24 DIAGNOSIS — R1312 Dysphagia, oropharyngeal phase: Secondary | ICD-10-CM | POA: Diagnosis not present

## 2022-01-24 DIAGNOSIS — E46 Unspecified protein-calorie malnutrition: Secondary | ICD-10-CM | POA: Diagnosis not present

## 2022-01-24 DIAGNOSIS — Z931 Gastrostomy status: Secondary | ICD-10-CM | POA: Diagnosis not present

## 2022-01-24 DIAGNOSIS — R197 Diarrhea, unspecified: Secondary | ICD-10-CM | POA: Diagnosis not present

## 2022-01-26 ENCOUNTER — Other Ambulatory Visit (HOSPITAL_BASED_OUTPATIENT_CLINIC_OR_DEPARTMENT_OTHER): Payer: Self-pay

## 2022-01-26 DIAGNOSIS — R4189 Other symptoms and signs involving cognitive functions and awareness: Secondary | ICD-10-CM

## 2022-01-26 DIAGNOSIS — M6281 Muscle weakness (generalized): Secondary | ICD-10-CM | POA: Diagnosis not present

## 2022-01-26 DIAGNOSIS — E46 Unspecified protein-calorie malnutrition: Secondary | ICD-10-CM | POA: Diagnosis not present

## 2022-01-26 DIAGNOSIS — W19XXXA Unspecified fall, initial encounter: Secondary | ICD-10-CM | POA: Diagnosis not present

## 2022-01-26 DIAGNOSIS — R29898 Other symptoms and signs involving the musculoskeletal system: Secondary | ICD-10-CM | POA: Diagnosis not present

## 2022-01-26 DIAGNOSIS — M79605 Pain in left leg: Secondary | ICD-10-CM | POA: Diagnosis not present

## 2022-01-27 DIAGNOSIS — M2550 Pain in unspecified joint: Secondary | ICD-10-CM | POA: Diagnosis not present

## 2022-01-27 DIAGNOSIS — R5381 Other malaise: Secondary | ICD-10-CM | POA: Diagnosis not present

## 2022-01-27 DIAGNOSIS — G4700 Insomnia, unspecified: Secondary | ICD-10-CM | POA: Diagnosis not present

## 2022-01-27 DIAGNOSIS — E46 Unspecified protein-calorie malnutrition: Secondary | ICD-10-CM | POA: Diagnosis not present

## 2022-01-28 DIAGNOSIS — R410 Disorientation, unspecified: Secondary | ICD-10-CM | POA: Diagnosis not present

## 2022-01-28 DIAGNOSIS — F419 Anxiety disorder, unspecified: Secondary | ICD-10-CM | POA: Diagnosis not present

## 2022-01-28 DIAGNOSIS — M25462 Effusion, left knee: Secondary | ICD-10-CM | POA: Diagnosis not present

## 2022-01-28 DIAGNOSIS — M25562 Pain in left knee: Secondary | ICD-10-CM | POA: Diagnosis not present

## 2022-01-31 DIAGNOSIS — Z931 Gastrostomy status: Secondary | ICD-10-CM | POA: Diagnosis not present

## 2022-01-31 DIAGNOSIS — R1312 Dysphagia, oropharyngeal phase: Secondary | ICD-10-CM | POA: Diagnosis not present

## 2022-02-01 DIAGNOSIS — E46 Unspecified protein-calorie malnutrition: Secondary | ICD-10-CM | POA: Diagnosis not present

## 2022-02-01 DIAGNOSIS — R1312 Dysphagia, oropharyngeal phase: Secondary | ICD-10-CM | POA: Diagnosis not present

## 2022-02-01 DIAGNOSIS — Z931 Gastrostomy status: Secondary | ICD-10-CM | POA: Diagnosis not present

## 2022-02-01 DIAGNOSIS — R410 Disorientation, unspecified: Secondary | ICD-10-CM | POA: Diagnosis not present

## 2022-02-02 ENCOUNTER — Other Ambulatory Visit: Payer: Self-pay | Admitting: *Deleted

## 2022-02-02 ENCOUNTER — Ambulatory Visit (HOSPITAL_COMMUNITY)
Admission: RE | Admit: 2022-02-02 | Discharge: 2022-02-02 | Disposition: A | Discharge status: Home / Self Care | Payer: No Typology Code available for payment source | Source: Ambulatory Visit | Attending: Family Medicine | Admitting: Family Medicine

## 2022-02-02 DIAGNOSIS — Z431 Encounter for attention to gastrostomy: Secondary | ICD-10-CM | POA: Insufficient documentation

## 2022-02-02 DIAGNOSIS — M2550 Pain in unspecified joint: Secondary | ICD-10-CM | POA: Diagnosis not present

## 2022-02-02 DIAGNOSIS — R1312 Dysphagia, oropharyngeal phase: Secondary | ICD-10-CM | POA: Diagnosis not present

## 2022-02-02 HISTORY — PX: IR GASTROSTOMY TUBE REMOVAL: IMG5492

## 2022-02-02 MED ORDER — LIDOCAINE VISCOUS HCL 2 % MT SOLN
OROMUCOSAL | Status: AC
  Administered 2022-02-02: 10 mL
  Filled 2022-02-02: qty 15

## 2022-02-02 NOTE — Patient Outreach (Signed)
[facility] Post- Acute Care Coordinator follow up. Ms. Rao resides in [facility].   PCP at [facility] has [facility] care management services available.   Facility site visit to [facility] skilled nursing facility. Met with [place] in [place] department. Ms. Coggin peg tube was discontinued today. Continues to progress with therapy.   Will continue to follow. Will send secure notification to [facility] when Ms. Hudon returns home.   Marthenia Rolling, MSN, RN,BSN [place] Acute Care Coordinator [phone] [facility]) [phone]  (Toll free office)

## 2022-02-03 DIAGNOSIS — S31109A Unspecified open wound of abdominal wall, unspecified quadrant without penetration into peritoneal cavity, initial encounter: Secondary | ICD-10-CM | POA: Diagnosis not present

## 2022-02-08 DIAGNOSIS — E46 Unspecified protein-calorie malnutrition: Secondary | ICD-10-CM | POA: Diagnosis not present

## 2022-02-08 DIAGNOSIS — D649 Anemia, unspecified: Secondary | ICD-10-CM | POA: Diagnosis not present

## 2022-02-08 DIAGNOSIS — I10 Essential (primary) hypertension: Secondary | ICD-10-CM | POA: Diagnosis not present

## 2022-02-08 DIAGNOSIS — D110 Benign neoplasm of parotid gland: Secondary | ICD-10-CM | POA: Diagnosis not present

## 2022-02-10 ENCOUNTER — Ambulatory Visit (HOSPITAL_BASED_OUTPATIENT_CLINIC_OR_DEPARTMENT_OTHER): Admission: RE | Admit: 2022-02-10 | Payer: Medicare Other | Source: Ambulatory Visit

## 2022-02-10 DIAGNOSIS — F05 Delirium due to known physiological condition: Secondary | ICD-10-CM | POA: Diagnosis not present

## 2022-02-10 DIAGNOSIS — S31109D Unspecified open wound of abdominal wall, unspecified quadrant without penetration into peritoneal cavity, subsequent encounter: Secondary | ICD-10-CM | POA: Diagnosis not present

## 2022-02-10 DIAGNOSIS — E46 Unspecified protein-calorie malnutrition: Secondary | ICD-10-CM | POA: Diagnosis not present

## 2022-02-10 DIAGNOSIS — R1312 Dysphagia, oropharyngeal phase: Secondary | ICD-10-CM | POA: Diagnosis not present

## 2022-02-11 DIAGNOSIS — S31109D Unspecified open wound of abdominal wall, unspecified quadrant without penetration into peritoneal cavity, subsequent encounter: Secondary | ICD-10-CM | POA: Diagnosis not present

## 2022-02-11 DIAGNOSIS — K5900 Constipation, unspecified: Secondary | ICD-10-CM | POA: Diagnosis not present

## 2022-02-14 DIAGNOSIS — R1312 Dysphagia, oropharyngeal phase: Secondary | ICD-10-CM | POA: Diagnosis not present

## 2022-02-14 DIAGNOSIS — S31109D Unspecified open wound of abdominal wall, unspecified quadrant without penetration into peritoneal cavity, subsequent encounter: Secondary | ICD-10-CM | POA: Diagnosis not present

## 2022-02-16 ENCOUNTER — Other Ambulatory Visit: Payer: Self-pay | Admitting: *Deleted

## 2022-02-16 DIAGNOSIS — R29898 Other symptoms and signs involving the musculoskeletal system: Secondary | ICD-10-CM | POA: Diagnosis not present

## 2022-02-16 DIAGNOSIS — R52 Pain, unspecified: Secondary | ICD-10-CM | POA: Diagnosis not present

## 2022-02-16 DIAGNOSIS — W19XXXA Unspecified fall, initial encounter: Secondary | ICD-10-CM | POA: Diagnosis not present

## 2022-02-16 DIAGNOSIS — M6281 Muscle weakness (generalized): Secondary | ICD-10-CM | POA: Diagnosis not present

## 2022-02-17 DIAGNOSIS — D649 Anemia, unspecified: Secondary | ICD-10-CM | POA: Diagnosis not present

## 2022-02-17 DIAGNOSIS — I10 Essential (primary) hypertension: Secondary | ICD-10-CM | POA: Diagnosis not present

## 2022-02-17 DIAGNOSIS — E46 Unspecified protein-calorie malnutrition: Secondary | ICD-10-CM | POA: Diagnosis not present

## 2022-02-17 DIAGNOSIS — D110 Benign neoplasm of parotid gland: Secondary | ICD-10-CM | POA: Diagnosis not present

## 2022-02-17 NOTE — Patient Outreach (Addendum)
[facility] Post- Acute Care Coordinator follow up. Ms. Guidotti remains in [facility] SN[facility]. Screening for care coordination/care management needs.  Facility site visit to [facility]. Met with Marita [place] and [place] in social work department. States plan remains to return home with son. Therapy working with Ms. Shelburne to be as independent as possible. Son works during the day. Ambulating 100 feet supervision/CGA. Advanced to mechanical soft diet.  Ms. Winnifred Friar PCP with [facility] has [facility] case management services.    Marthenia Rolling, MSN, RN,BSN [place] Acute Care Coordinator [phone] [facility]) [phone]  (Toll free office)

## 2022-02-21 ENCOUNTER — Ambulatory Visit: Payer: Medicare Other | Admitting: Neurology

## 2022-02-21 ENCOUNTER — Encounter: Payer: Self-pay | Admitting: Neurology

## 2022-02-22 DIAGNOSIS — F419 Anxiety disorder, unspecified: Secondary | ICD-10-CM | POA: Diagnosis not present

## 2022-02-22 DIAGNOSIS — R410 Disorientation, unspecified: Secondary | ICD-10-CM | POA: Diagnosis not present

## 2022-02-22 DIAGNOSIS — R1312 Dysphagia, oropharyngeal phase: Secondary | ICD-10-CM | POA: Diagnosis not present

## 2022-02-22 DIAGNOSIS — R29898 Other symptoms and signs involving the musculoskeletal system: Secondary | ICD-10-CM | POA: Diagnosis not present

## 2022-02-22 DIAGNOSIS — F05 Delirium due to known physiological condition: Secondary | ICD-10-CM | POA: Diagnosis not present

## 2022-02-22 DIAGNOSIS — R251 Tremor, unspecified: Secondary | ICD-10-CM | POA: Diagnosis not present

## 2022-02-23 ENCOUNTER — Other Ambulatory Visit: Payer: Self-pay | Admitting: *Deleted

## 2022-02-23 DIAGNOSIS — M2550 Pain in unspecified joint: Secondary | ICD-10-CM | POA: Diagnosis not present

## 2022-02-23 DIAGNOSIS — G510 Bell's palsy: Secondary | ICD-10-CM | POA: Diagnosis not present

## 2022-02-23 DIAGNOSIS — R251 Tremor, unspecified: Secondary | ICD-10-CM | POA: Diagnosis not present

## 2022-02-23 DIAGNOSIS — R410 Disorientation, unspecified: Secondary | ICD-10-CM | POA: Diagnosis not present

## 2022-02-23 DIAGNOSIS — M6281 Muscle weakness (generalized): Secondary | ICD-10-CM | POA: Diagnosis not present

## 2022-02-23 NOTE — Patient Outreach (Signed)
[facility] Post- Acute Care Coordinator follow up. Ms. Salata remains in [facility].  Facility site visit to [facility]. Met with Dipam, therapy manager who reports speech therapy continues to work with Ms. Bays as well. Advancing diet to regular. Plan is to return home with son. Goal is for independence with LB ADLs and toileting. Currently she is minA to [place]. Dipam reports Ms. Benway has fallen multiple times at SNF without injury. Therapy is working with Ms. Gorter with safety awareness and precautions.   Will continue to follow.    Marthenia Rolling, MSN, RN,BSN [place] Acute Care Coordinator [phone] [facility]) [phone]  (Toll free office)

## 2022-02-28 DIAGNOSIS — U071 COVID-19: Secondary | ICD-10-CM | POA: Diagnosis not present

## 2022-03-02 ENCOUNTER — Other Ambulatory Visit: Payer: Self-pay | Admitting: *Deleted

## 2022-03-02 DIAGNOSIS — R059 Cough, unspecified: Secondary | ICD-10-CM | POA: Diagnosis not present

## 2022-03-02 DIAGNOSIS — U071 COVID-19: Secondary | ICD-10-CM | POA: Diagnosis not present

## 2022-03-02 DIAGNOSIS — J449 Chronic obstructive pulmonary disease, unspecified: Secondary | ICD-10-CM | POA: Diagnosis not present

## 2022-03-02 NOTE — Patient Outreach (Signed)
[facility] Post- Acute Care Coordinator follow up. [name] resides [facility] SN[facility].   Update received from [place]. [name] recently tested positive for COVID on 02/27/22.  Will return home upon SNF discharge.   Will continue to follow.    Gina Rolling, MSN, RN,BSN [place] Acute Care Coordinator [phone] [facility]) [phone]  (Toll free office)

## 2022-03-16 DIAGNOSIS — J449 Chronic obstructive pulmonary disease, unspecified: Secondary | ICD-10-CM | POA: Diagnosis not present

## 2022-03-16 DIAGNOSIS — F419 Anxiety disorder, unspecified: Secondary | ICD-10-CM | POA: Diagnosis not present

## 2022-03-16 DIAGNOSIS — G894 Chronic pain syndrome: Secondary | ICD-10-CM | POA: Diagnosis not present

## 2022-03-16 DIAGNOSIS — M6281 Muscle weakness (generalized): Secondary | ICD-10-CM | POA: Diagnosis not present

## 2022-03-16 DIAGNOSIS — I10 Essential (primary) hypertension: Secondary | ICD-10-CM | POA: Diagnosis not present

## 2022-03-18 ENCOUNTER — Other Ambulatory Visit: Payer: Self-pay | Admitting: *Deleted

## 2022-03-18 NOTE — Patient Outreach (Signed)
[place] Coordinator follow up.   Verified in [facility] Ms. Strozier discharged from [facility] on 03/17/22. Will have [place] home health.   PCP office [facility] has [facility] care management. Will send secure notification to [facility] of Ms. Easton discharge.    Marthenia Rolling, MSN, RN,BSN [place] Acute Care Coordinator [phone] [facility]) [phone]  (Toll free office)

## 2022-03-27 DIAGNOSIS — F419 Anxiety disorder, unspecified: Secondary | ICD-10-CM | POA: Diagnosis not present

## 2022-03-27 DIAGNOSIS — M1990 Unspecified osteoarthritis, unspecified site: Secondary | ICD-10-CM | POA: Diagnosis not present

## 2022-03-27 DIAGNOSIS — Z8589 Personal history of malignant neoplasm of other organs and systems: Secondary | ICD-10-CM | POA: Diagnosis not present

## 2022-03-27 DIAGNOSIS — D649 Anemia, unspecified: Secondary | ICD-10-CM | POA: Diagnosis not present

## 2022-03-27 DIAGNOSIS — J159 Unspecified bacterial pneumonia: Secondary | ICD-10-CM | POA: Diagnosis not present

## 2022-03-27 DIAGNOSIS — I10 Essential (primary) hypertension: Secondary | ICD-10-CM | POA: Diagnosis not present

## 2022-03-27 DIAGNOSIS — E7800 Pure hypercholesterolemia, unspecified: Secondary | ICD-10-CM | POA: Diagnosis not present

## 2022-03-27 DIAGNOSIS — J440 Chronic obstructive pulmonary disease with acute lower respiratory infection: Secondary | ICD-10-CM | POA: Diagnosis not present

## 2022-03-27 DIAGNOSIS — Z9181 History of falling: Secondary | ICD-10-CM | POA: Diagnosis not present

## 2022-03-27 DIAGNOSIS — J9620 Acute and chronic respiratory failure, unspecified whether with hypoxia or hypercapnia: Secondary | ICD-10-CM | POA: Diagnosis not present

## 2022-03-27 DIAGNOSIS — G510 Bell's palsy: Secondary | ICD-10-CM | POA: Diagnosis not present

## 2022-03-28 ENCOUNTER — Ambulatory Visit (HOSPITAL_BASED_OUTPATIENT_CLINIC_OR_DEPARTMENT_OTHER): Payer: Medicare Other

## 2022-03-29 DIAGNOSIS — J9620 Acute and chronic respiratory failure, unspecified whether with hypoxia or hypercapnia: Secondary | ICD-10-CM | POA: Diagnosis not present

## 2022-03-29 DIAGNOSIS — D649 Anemia, unspecified: Secondary | ICD-10-CM | POA: Diagnosis not present

## 2022-03-29 DIAGNOSIS — J440 Chronic obstructive pulmonary disease with acute lower respiratory infection: Secondary | ICD-10-CM | POA: Diagnosis not present

## 2022-03-29 DIAGNOSIS — G510 Bell's palsy: Secondary | ICD-10-CM | POA: Diagnosis not present

## 2022-03-29 DIAGNOSIS — I10 Essential (primary) hypertension: Secondary | ICD-10-CM | POA: Diagnosis not present

## 2022-03-29 DIAGNOSIS — J159 Unspecified bacterial pneumonia: Secondary | ICD-10-CM | POA: Diagnosis not present

## 2022-03-30 DIAGNOSIS — R609 Edema, unspecified: Secondary | ICD-10-CM | POA: Diagnosis not present

## 2022-03-30 DIAGNOSIS — F331 Major depressive disorder, recurrent, moderate: Secondary | ICD-10-CM | POA: Diagnosis not present

## 2022-03-30 DIAGNOSIS — J9621 Acute and chronic respiratory failure with hypoxia: Secondary | ICD-10-CM | POA: Diagnosis not present

## 2022-03-30 DIAGNOSIS — I129 Hypertensive chronic kidney disease with stage 1 through stage 4 chronic kidney disease, or unspecified chronic kidney disease: Secondary | ICD-10-CM | POA: Diagnosis not present

## 2022-03-30 DIAGNOSIS — J441 Chronic obstructive pulmonary disease with (acute) exacerbation: Secondary | ICD-10-CM | POA: Diagnosis not present

## 2022-03-30 DIAGNOSIS — J189 Pneumonia, unspecified organism: Secondary | ICD-10-CM | POA: Diagnosis not present

## 2022-03-30 DIAGNOSIS — F419 Anxiety disorder, unspecified: Secondary | ICD-10-CM | POA: Diagnosis not present

## 2022-04-01 DIAGNOSIS — J9620 Acute and chronic respiratory failure, unspecified whether with hypoxia or hypercapnia: Secondary | ICD-10-CM | POA: Diagnosis not present

## 2022-04-01 DIAGNOSIS — G510 Bell's palsy: Secondary | ICD-10-CM | POA: Diagnosis not present

## 2022-04-01 DIAGNOSIS — J159 Unspecified bacterial pneumonia: Secondary | ICD-10-CM | POA: Diagnosis not present

## 2022-04-01 DIAGNOSIS — I10 Essential (primary) hypertension: Secondary | ICD-10-CM | POA: Diagnosis not present

## 2022-04-01 DIAGNOSIS — D649 Anemia, unspecified: Secondary | ICD-10-CM | POA: Diagnosis not present

## 2022-04-01 DIAGNOSIS — J440 Chronic obstructive pulmonary disease with acute lower respiratory infection: Secondary | ICD-10-CM | POA: Diagnosis not present

## 2022-04-04 DIAGNOSIS — J440 Chronic obstructive pulmonary disease with acute lower respiratory infection: Secondary | ICD-10-CM | POA: Diagnosis not present

## 2022-04-04 DIAGNOSIS — J159 Unspecified bacterial pneumonia: Secondary | ICD-10-CM | POA: Diagnosis not present

## 2022-04-04 DIAGNOSIS — G510 Bell's palsy: Secondary | ICD-10-CM | POA: Diagnosis not present

## 2022-04-04 DIAGNOSIS — D649 Anemia, unspecified: Secondary | ICD-10-CM | POA: Diagnosis not present

## 2022-04-04 DIAGNOSIS — J9620 Acute and chronic respiratory failure, unspecified whether with hypoxia or hypercapnia: Secondary | ICD-10-CM | POA: Diagnosis not present

## 2022-04-04 DIAGNOSIS — I10 Essential (primary) hypertension: Secondary | ICD-10-CM | POA: Diagnosis not present

## 2022-04-07 DIAGNOSIS — G510 Bell's palsy: Secondary | ICD-10-CM | POA: Diagnosis not present

## 2022-04-07 DIAGNOSIS — J440 Chronic obstructive pulmonary disease with acute lower respiratory infection: Secondary | ICD-10-CM | POA: Diagnosis not present

## 2022-04-07 DIAGNOSIS — J159 Unspecified bacterial pneumonia: Secondary | ICD-10-CM | POA: Diagnosis not present

## 2022-04-07 DIAGNOSIS — I10 Essential (primary) hypertension: Secondary | ICD-10-CM | POA: Diagnosis not present

## 2022-04-07 DIAGNOSIS — J9620 Acute and chronic respiratory failure, unspecified whether with hypoxia or hypercapnia: Secondary | ICD-10-CM | POA: Diagnosis not present

## 2022-04-07 DIAGNOSIS — D649 Anemia, unspecified: Secondary | ICD-10-CM | POA: Diagnosis not present

## 2022-04-11 DIAGNOSIS — I10 Essential (primary) hypertension: Secondary | ICD-10-CM | POA: Diagnosis not present

## 2022-04-11 DIAGNOSIS — J159 Unspecified bacterial pneumonia: Secondary | ICD-10-CM | POA: Diagnosis not present

## 2022-04-11 DIAGNOSIS — J9620 Acute and chronic respiratory failure, unspecified whether with hypoxia or hypercapnia: Secondary | ICD-10-CM | POA: Diagnosis not present

## 2022-04-11 DIAGNOSIS — J440 Chronic obstructive pulmonary disease with acute lower respiratory infection: Secondary | ICD-10-CM | POA: Diagnosis not present

## 2022-04-11 DIAGNOSIS — G510 Bell's palsy: Secondary | ICD-10-CM | POA: Diagnosis not present

## 2022-04-11 DIAGNOSIS — D649 Anemia, unspecified: Secondary | ICD-10-CM | POA: Diagnosis not present

## 2022-04-12 DIAGNOSIS — J9620 Acute and chronic respiratory failure, unspecified whether with hypoxia or hypercapnia: Secondary | ICD-10-CM | POA: Diagnosis not present

## 2022-04-12 DIAGNOSIS — D649 Anemia, unspecified: Secondary | ICD-10-CM | POA: Diagnosis not present

## 2022-04-12 DIAGNOSIS — G510 Bell's palsy: Secondary | ICD-10-CM | POA: Diagnosis not present

## 2022-04-12 DIAGNOSIS — J440 Chronic obstructive pulmonary disease with acute lower respiratory infection: Secondary | ICD-10-CM | POA: Diagnosis not present

## 2022-04-12 DIAGNOSIS — J159 Unspecified bacterial pneumonia: Secondary | ICD-10-CM | POA: Diagnosis not present

## 2022-04-12 DIAGNOSIS — I10 Essential (primary) hypertension: Secondary | ICD-10-CM | POA: Diagnosis not present

## 2022-04-14 DIAGNOSIS — I10 Essential (primary) hypertension: Secondary | ICD-10-CM | POA: Diagnosis not present

## 2022-04-14 DIAGNOSIS — D649 Anemia, unspecified: Secondary | ICD-10-CM | POA: Diagnosis not present

## 2022-04-14 DIAGNOSIS — G510 Bell's palsy: Secondary | ICD-10-CM | POA: Diagnosis not present

## 2022-04-14 DIAGNOSIS — J440 Chronic obstructive pulmonary disease with acute lower respiratory infection: Secondary | ICD-10-CM | POA: Diagnosis not present

## 2022-04-14 DIAGNOSIS — J159 Unspecified bacterial pneumonia: Secondary | ICD-10-CM | POA: Diagnosis not present

## 2022-04-14 DIAGNOSIS — J9620 Acute and chronic respiratory failure, unspecified whether with hypoxia or hypercapnia: Secondary | ICD-10-CM | POA: Diagnosis not present

## 2022-04-18 DIAGNOSIS — J440 Chronic obstructive pulmonary disease with acute lower respiratory infection: Secondary | ICD-10-CM | POA: Diagnosis not present

## 2022-04-18 DIAGNOSIS — I10 Essential (primary) hypertension: Secondary | ICD-10-CM | POA: Diagnosis not present

## 2022-04-18 DIAGNOSIS — J9620 Acute and chronic respiratory failure, unspecified whether with hypoxia or hypercapnia: Secondary | ICD-10-CM | POA: Diagnosis not present

## 2022-04-18 DIAGNOSIS — D649 Anemia, unspecified: Secondary | ICD-10-CM | POA: Diagnosis not present

## 2022-04-18 DIAGNOSIS — G510 Bell's palsy: Secondary | ICD-10-CM | POA: Diagnosis not present

## 2022-04-18 DIAGNOSIS — J159 Unspecified bacterial pneumonia: Secondary | ICD-10-CM | POA: Diagnosis not present

## 2022-04-19 DIAGNOSIS — Z23 Encounter for immunization: Secondary | ICD-10-CM | POA: Diagnosis not present

## 2022-04-19 DIAGNOSIS — J449 Chronic obstructive pulmonary disease, unspecified: Secondary | ICD-10-CM | POA: Diagnosis not present

## 2022-04-19 DIAGNOSIS — J9621 Acute and chronic respiratory failure with hypoxia: Secondary | ICD-10-CM | POA: Diagnosis not present

## 2022-04-19 DIAGNOSIS — F419 Anxiety disorder, unspecified: Secondary | ICD-10-CM | POA: Diagnosis not present

## 2022-04-19 DIAGNOSIS — Z0000 Encounter for general adult medical examination without abnormal findings: Secondary | ICD-10-CM | POA: Diagnosis not present

## 2022-04-19 DIAGNOSIS — E559 Vitamin D deficiency, unspecified: Secondary | ICD-10-CM | POA: Diagnosis not present

## 2022-04-19 DIAGNOSIS — N183 Chronic kidney disease, stage 3 unspecified: Secondary | ICD-10-CM | POA: Diagnosis not present

## 2022-04-19 DIAGNOSIS — D692 Other nonthrombocytopenic purpura: Secondary | ICD-10-CM | POA: Diagnosis not present

## 2022-04-19 DIAGNOSIS — F331 Major depressive disorder, recurrent, moderate: Secondary | ICD-10-CM | POA: Diagnosis not present

## 2022-04-19 DIAGNOSIS — E785 Hyperlipidemia, unspecified: Secondary | ICD-10-CM | POA: Diagnosis not present

## 2022-04-19 DIAGNOSIS — I129 Hypertensive chronic kidney disease with stage 1 through stage 4 chronic kidney disease, or unspecified chronic kidney disease: Secondary | ICD-10-CM | POA: Diagnosis not present

## 2022-04-20 DIAGNOSIS — J9620 Acute and chronic respiratory failure, unspecified whether with hypoxia or hypercapnia: Secondary | ICD-10-CM | POA: Diagnosis not present

## 2022-04-20 DIAGNOSIS — G510 Bell's palsy: Secondary | ICD-10-CM | POA: Diagnosis not present

## 2022-04-20 DIAGNOSIS — J159 Unspecified bacterial pneumonia: Secondary | ICD-10-CM | POA: Diagnosis not present

## 2022-04-20 DIAGNOSIS — J440 Chronic obstructive pulmonary disease with acute lower respiratory infection: Secondary | ICD-10-CM | POA: Diagnosis not present

## 2022-04-20 DIAGNOSIS — D649 Anemia, unspecified: Secondary | ICD-10-CM | POA: Diagnosis not present

## 2022-04-20 DIAGNOSIS — I10 Essential (primary) hypertension: Secondary | ICD-10-CM | POA: Diagnosis not present

## 2022-04-26 DIAGNOSIS — D649 Anemia, unspecified: Secondary | ICD-10-CM | POA: Diagnosis not present

## 2022-04-26 DIAGNOSIS — Z9181 History of falling: Secondary | ICD-10-CM | POA: Diagnosis not present

## 2022-04-26 DIAGNOSIS — I10 Essential (primary) hypertension: Secondary | ICD-10-CM | POA: Diagnosis not present

## 2022-04-26 DIAGNOSIS — J159 Unspecified bacterial pneumonia: Secondary | ICD-10-CM | POA: Diagnosis not present

## 2022-04-26 DIAGNOSIS — M1990 Unspecified osteoarthritis, unspecified site: Secondary | ICD-10-CM | POA: Diagnosis not present

## 2022-04-26 DIAGNOSIS — F419 Anxiety disorder, unspecified: Secondary | ICD-10-CM | POA: Diagnosis not present

## 2022-04-26 DIAGNOSIS — Z8589 Personal history of malignant neoplasm of other organs and systems: Secondary | ICD-10-CM | POA: Diagnosis not present

## 2022-04-26 DIAGNOSIS — J440 Chronic obstructive pulmonary disease with acute lower respiratory infection: Secondary | ICD-10-CM | POA: Diagnosis not present

## 2022-04-26 DIAGNOSIS — E7800 Pure hypercholesterolemia, unspecified: Secondary | ICD-10-CM | POA: Diagnosis not present

## 2022-04-26 DIAGNOSIS — J9620 Acute and chronic respiratory failure, unspecified whether with hypoxia or hypercapnia: Secondary | ICD-10-CM | POA: Diagnosis not present

## 2022-04-26 DIAGNOSIS — G510 Bell's palsy: Secondary | ICD-10-CM | POA: Diagnosis not present

## 2022-05-04 DIAGNOSIS — J9620 Acute and chronic respiratory failure, unspecified whether with hypoxia or hypercapnia: Secondary | ICD-10-CM | POA: Diagnosis not present

## 2022-05-04 DIAGNOSIS — I10 Essential (primary) hypertension: Secondary | ICD-10-CM | POA: Diagnosis not present

## 2022-05-04 DIAGNOSIS — J440 Chronic obstructive pulmonary disease with acute lower respiratory infection: Secondary | ICD-10-CM | POA: Diagnosis not present

## 2022-05-04 DIAGNOSIS — D649 Anemia, unspecified: Secondary | ICD-10-CM | POA: Diagnosis not present

## 2022-05-04 DIAGNOSIS — J159 Unspecified bacterial pneumonia: Secondary | ICD-10-CM | POA: Diagnosis not present

## 2022-05-04 DIAGNOSIS — G510 Bell's palsy: Secondary | ICD-10-CM | POA: Diagnosis not present

## 2022-05-10 DIAGNOSIS — G510 Bell's palsy: Secondary | ICD-10-CM | POA: Diagnosis not present

## 2022-05-10 DIAGNOSIS — J9620 Acute and chronic respiratory failure, unspecified whether with hypoxia or hypercapnia: Secondary | ICD-10-CM | POA: Diagnosis not present

## 2022-05-10 DIAGNOSIS — D649 Anemia, unspecified: Secondary | ICD-10-CM | POA: Diagnosis not present

## 2022-05-10 DIAGNOSIS — J440 Chronic obstructive pulmonary disease with acute lower respiratory infection: Secondary | ICD-10-CM | POA: Diagnosis not present

## 2022-05-10 DIAGNOSIS — I10 Essential (primary) hypertension: Secondary | ICD-10-CM | POA: Diagnosis not present

## 2022-05-10 DIAGNOSIS — J159 Unspecified bacterial pneumonia: Secondary | ICD-10-CM | POA: Diagnosis not present

## 2022-05-13 DIAGNOSIS — Z23 Encounter for immunization: Secondary | ICD-10-CM | POA: Diagnosis not present

## 2022-05-19 IMAGING — DX DG CHEST 2V
2 series · 2 of 2 positions shown · non-contrast
Comparison: 10/02/2019

CLINICAL DATA: Dyspnea on exertion.

EXAM:
CHEST - 2 VIEW

[dg chest 2 view (1 of 2)]
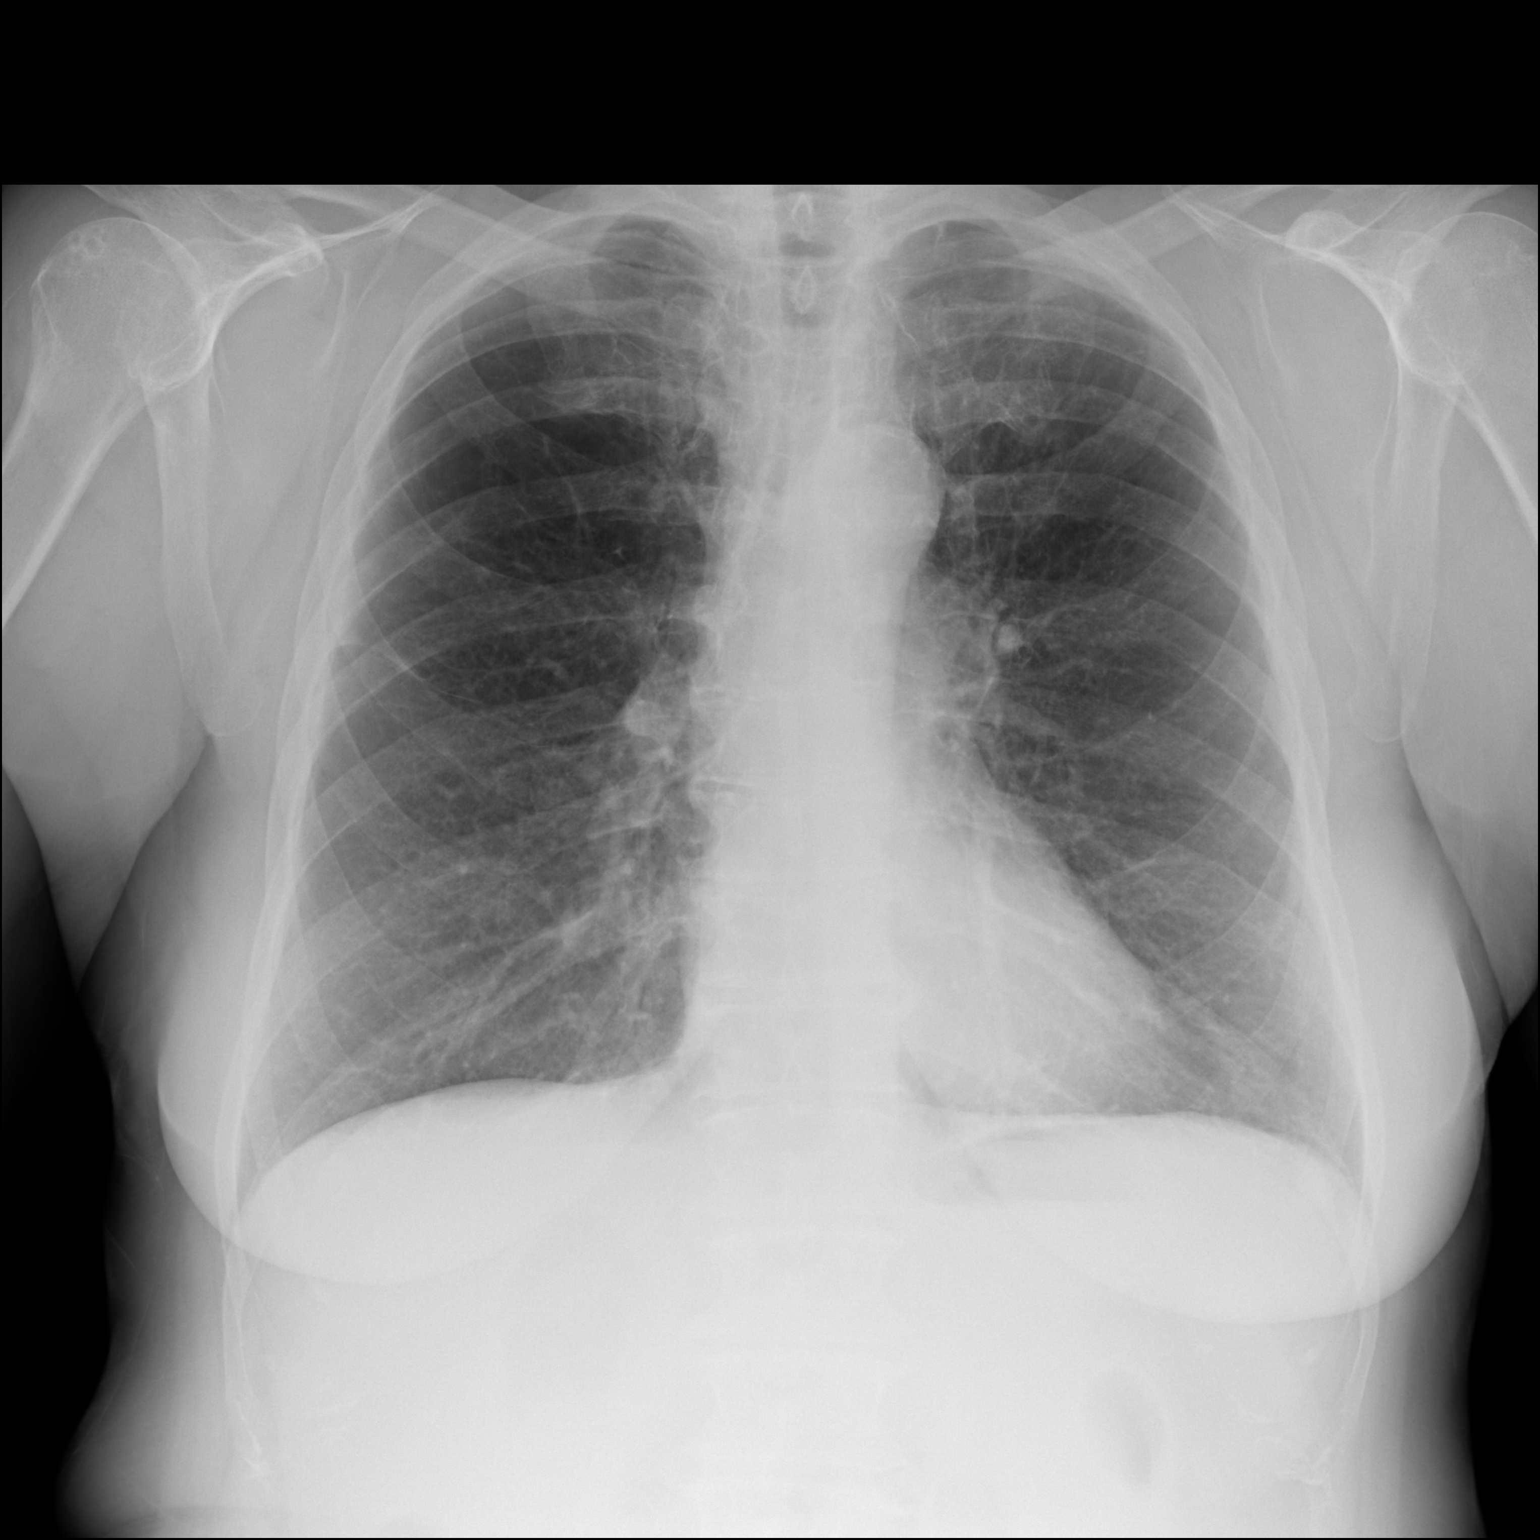

[dg chest 2 view (2 of 2)]
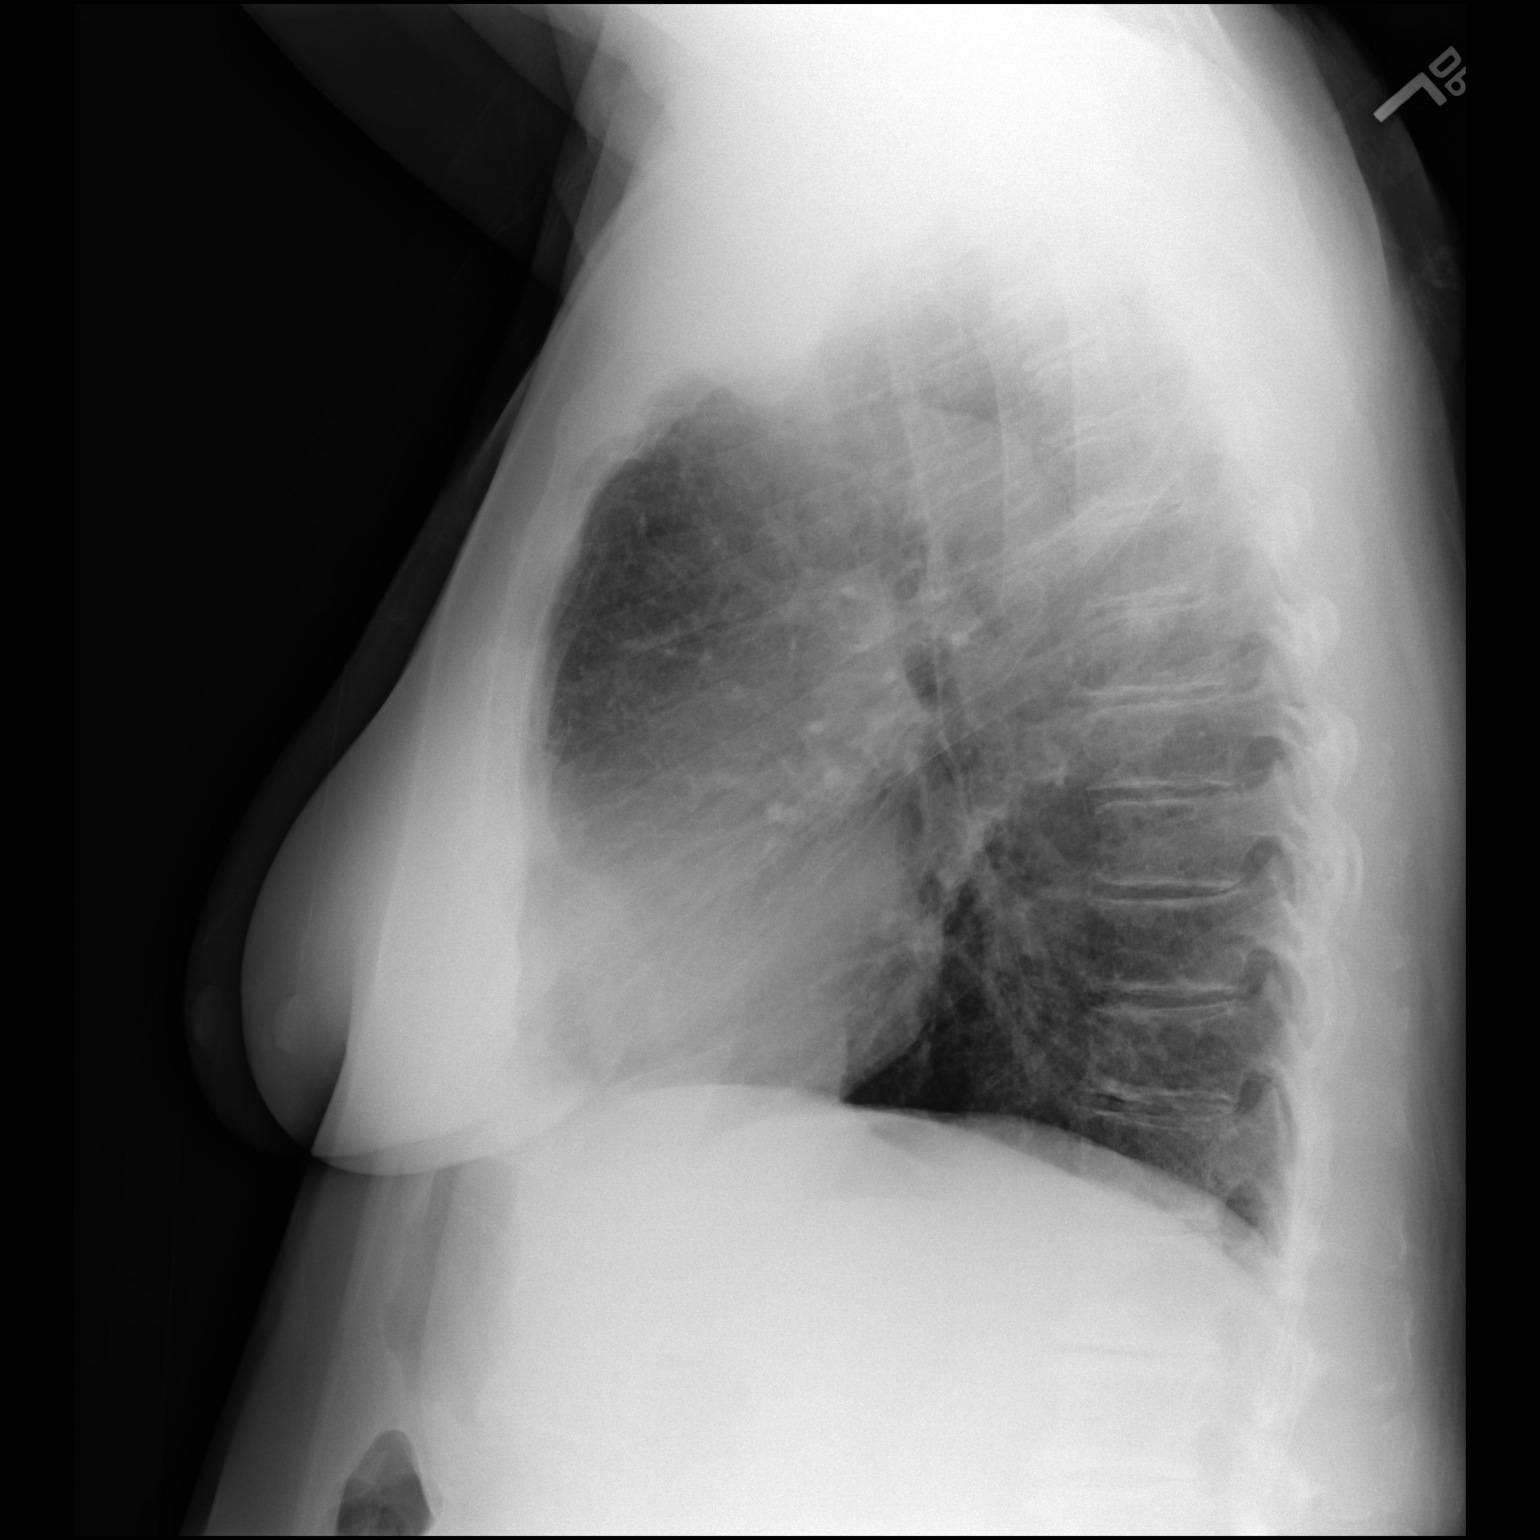

[2 of 2 positions shown; findings below may reference images not displayed]

FINDINGS: The heart size and mediastinal contours are within normal limits.
Both lungs are clear. The visualized skeletal structures are
unremarkable.
IMPRESSION: No active cardiopulmonary disease.

## 2022-05-19 IMAGING — US US EXTREM LOW VENOUS*L*
1 series · 13 of 24 positions shown · non-contrast
Comparison: None.

CLINICAL DATA: Left lower extremity swelling, elevated D-dimer



[Series 1: us extrem low venous*left* · 0.07mm/px · 13 of 42 slices shown]
[im 1/42]
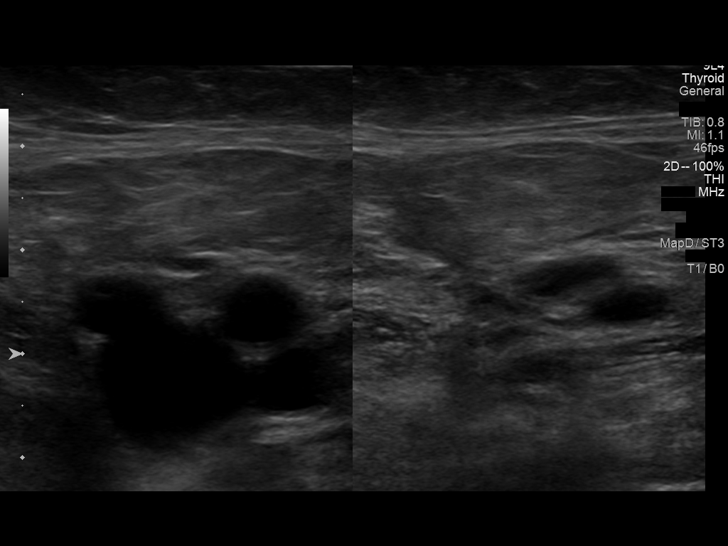
[im 4/42]
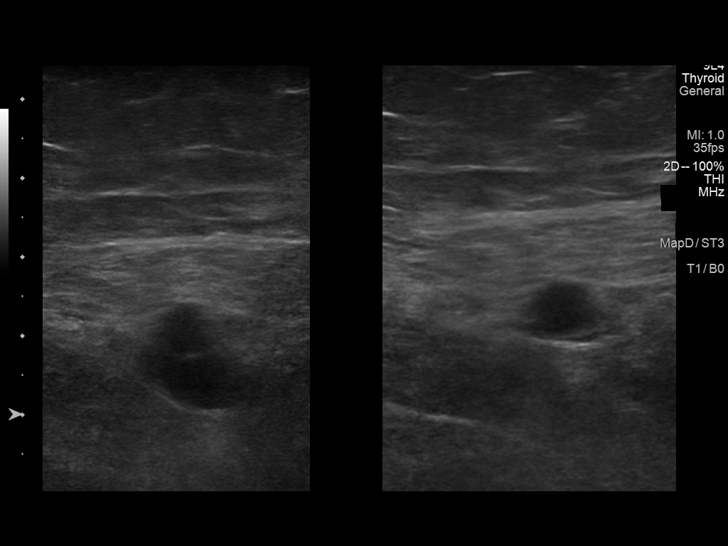
[im 8/42]
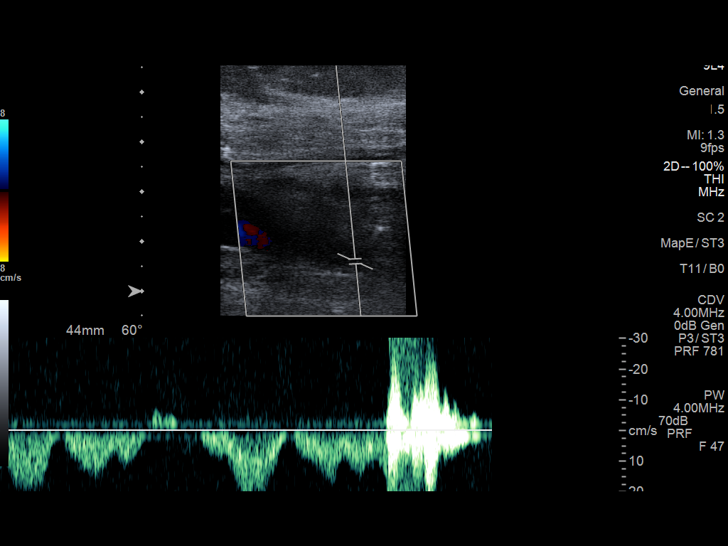
[im 11/42]
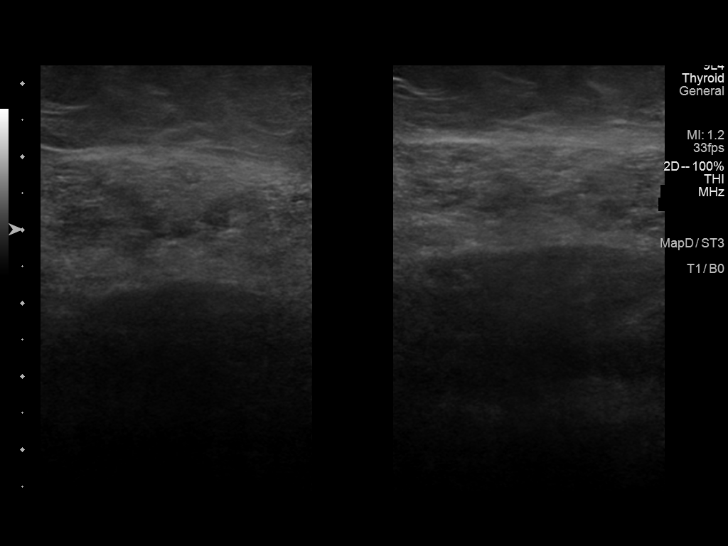
[im 15/42]
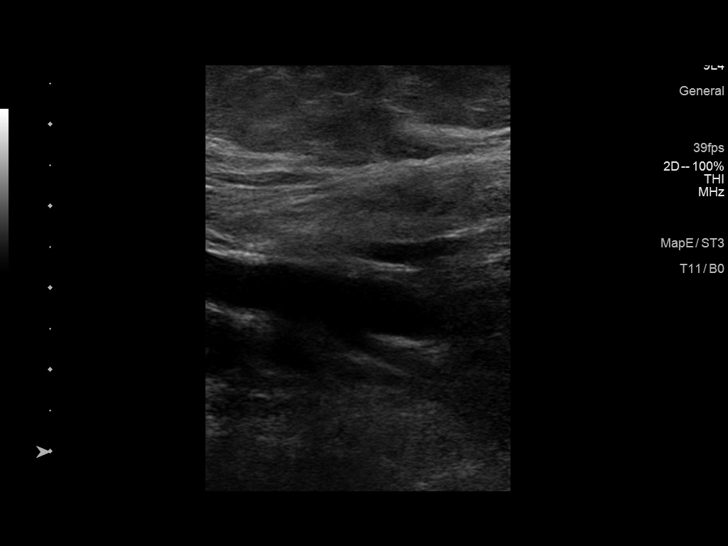
[im 18/42]
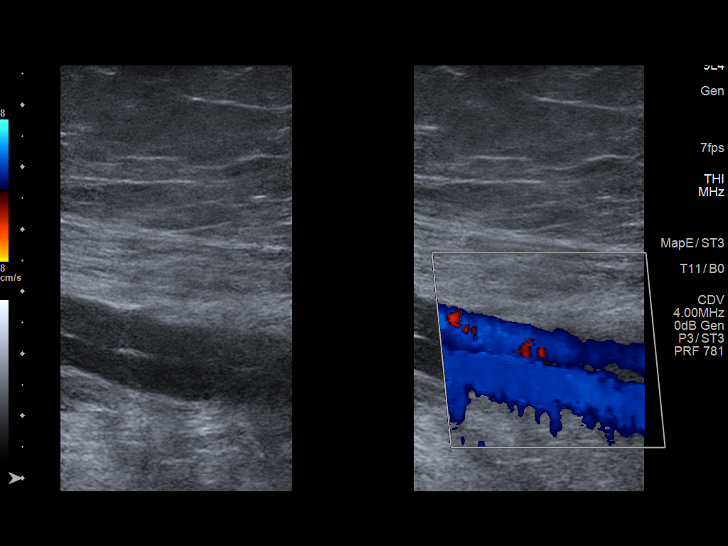
[im 22/42]
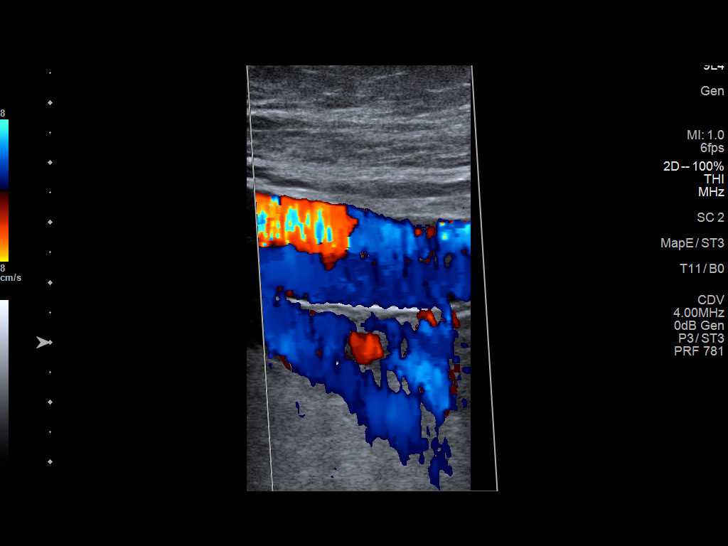
[im 24/42]
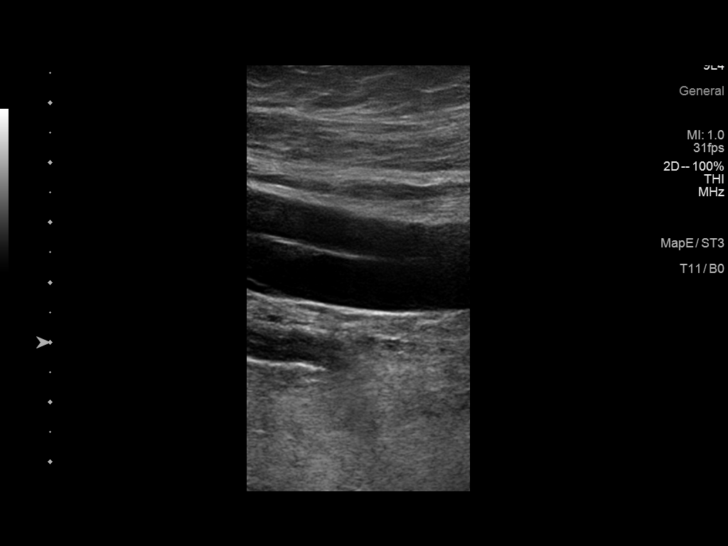
[im 27/42]
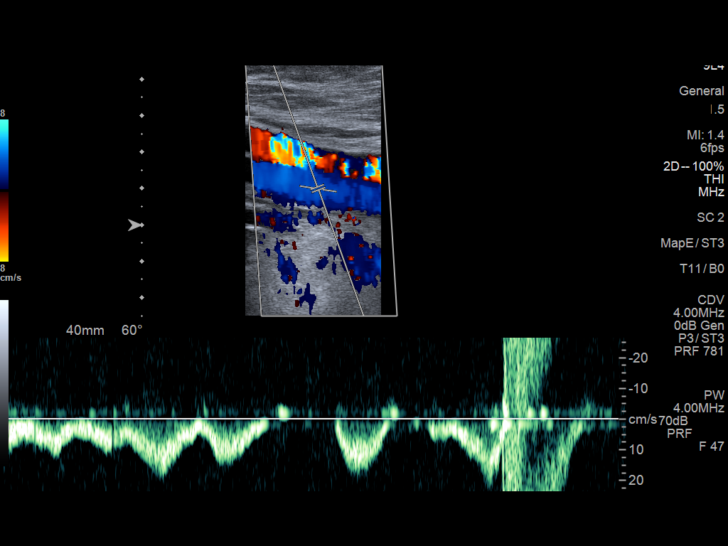
[im 31/42]
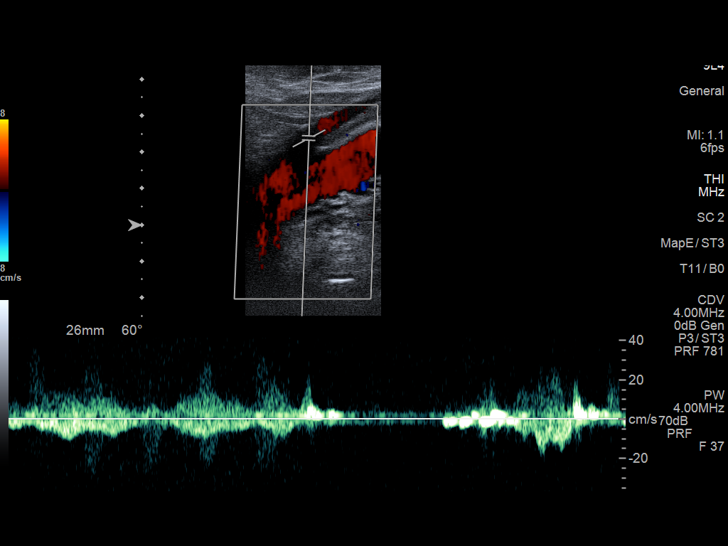
[im 34/42]
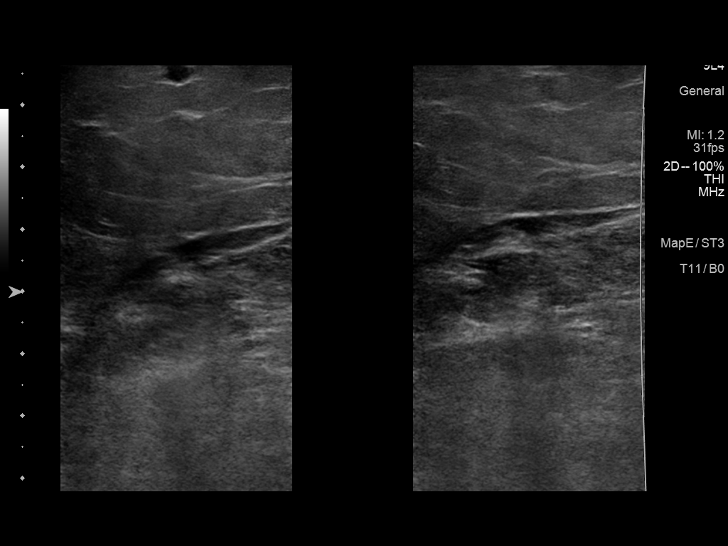
[im 38/42]
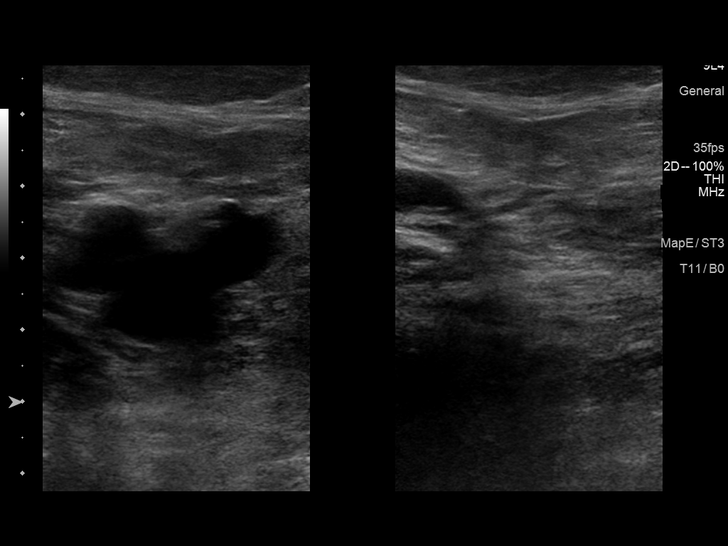
[im 42/42]
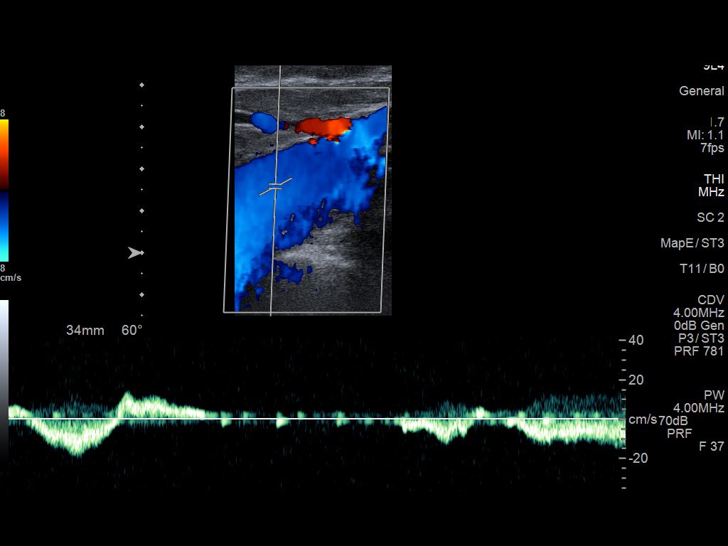

[13 of 24 positions shown; findings below may reference images not displayed]

FINDINGS: Contralateral Common Femoral Vein: Respiratory phasicity is normal
and symmetric with the symptomatic side. No evidence of thrombus.
Normal compressibility.

Common Femoral Vein: No evidence of thrombus. Normal
compressibility, respiratory phasicity and response to augmentation.

Saphenofemoral Junction: No evidence of thrombus. Normal
compressibility and flow on color Doppler imaging.

Profunda Femoral Vein: No evidence of thrombus. Normal
compressibility and flow on color Doppler imaging.

Femoral Vein: No evidence of thrombus. Normal compressibility,
respiratory phasicity and response to augmentation.

Popliteal Vein: No evidence of thrombus. Normal compressibility,
respiratory phasicity and response to augmentation.

Calf Veins: No evidence of thrombus. Normal compressibility and flow
on color Doppler imaging.

Superficial Great Saphenous Vein: No evidence of thrombus. Normal
compressibility.
IMPRESSION: No evidence of deep venous thrombosis.

## 2022-07-13 DIAGNOSIS — D485 Neoplasm of uncertain behavior of skin: Secondary | ICD-10-CM | POA: Diagnosis not present

## 2022-07-13 DIAGNOSIS — C4402 Squamous cell carcinoma of skin of lip: Secondary | ICD-10-CM | POA: Diagnosis not present

## 2022-08-01 DIAGNOSIS — C4402 Squamous cell carcinoma of skin of lip: Secondary | ICD-10-CM | POA: Diagnosis not present

## 2022-08-03 DIAGNOSIS — D485 Neoplasm of uncertain behavior of skin: Secondary | ICD-10-CM | POA: Diagnosis not present

## 2022-08-03 DIAGNOSIS — D044 Carcinoma in situ of skin of scalp and neck: Secondary | ICD-10-CM | POA: Diagnosis not present

## 2022-08-10 DIAGNOSIS — I10 Essential (primary) hypertension: Secondary | ICD-10-CM | POA: Diagnosis not present

## 2022-08-10 DIAGNOSIS — J449 Chronic obstructive pulmonary disease, unspecified: Secondary | ICD-10-CM | POA: Diagnosis not present

## 2022-08-10 DIAGNOSIS — Q670 Congenital facial asymmetry: Secondary | ICD-10-CM | POA: Diagnosis not present

## 2022-08-10 DIAGNOSIS — F331 Major depressive disorder, recurrent, moderate: Secondary | ICD-10-CM | POA: Diagnosis not present

## 2022-08-10 DIAGNOSIS — E785 Hyperlipidemia, unspecified: Secondary | ICD-10-CM | POA: Diagnosis not present

## 2022-08-11 ENCOUNTER — Other Ambulatory Visit: Payer: Self-pay | Admitting: Family Medicine

## 2022-08-11 DIAGNOSIS — Q670 Congenital facial asymmetry: Secondary | ICD-10-CM

## 2022-09-01 DIAGNOSIS — Z5189 Encounter for other specified aftercare: Secondary | ICD-10-CM | POA: Diagnosis not present

## 2022-09-01 DIAGNOSIS — Z85828 Personal history of other malignant neoplasm of skin: Secondary | ICD-10-CM | POA: Diagnosis not present

## 2022-10-28 DIAGNOSIS — H40023 Open angle with borderline findings, high risk, bilateral: Secondary | ICD-10-CM | POA: Diagnosis not present

## 2022-10-28 DIAGNOSIS — H35371 Puckering of macula, right eye: Secondary | ICD-10-CM | POA: Diagnosis not present

## 2022-10-28 DIAGNOSIS — H35351 Cystoid macular degeneration, right eye: Secondary | ICD-10-CM | POA: Diagnosis not present

## 2022-10-28 DIAGNOSIS — Z961 Presence of intraocular lens: Secondary | ICD-10-CM | POA: Diagnosis not present

## 2022-10-28 DIAGNOSIS — H353132 Nonexudative age-related macular degeneration, bilateral, intermediate dry stage: Secondary | ICD-10-CM | POA: Diagnosis not present

## 2022-11-01 DIAGNOSIS — F331 Major depressive disorder, recurrent, moderate: Secondary | ICD-10-CM | POA: Diagnosis not present

## 2022-11-01 DIAGNOSIS — J449 Chronic obstructive pulmonary disease, unspecified: Secondary | ICD-10-CM | POA: Diagnosis not present

## 2022-11-01 DIAGNOSIS — G629 Polyneuropathy, unspecified: Secondary | ICD-10-CM | POA: Diagnosis not present

## 2022-11-01 DIAGNOSIS — Q670 Congenital facial asymmetry: Secondary | ICD-10-CM | POA: Diagnosis not present

## 2022-11-01 DIAGNOSIS — E785 Hyperlipidemia, unspecified: Secondary | ICD-10-CM | POA: Diagnosis not present

## 2022-11-01 DIAGNOSIS — I10 Essential (primary) hypertension: Secondary | ICD-10-CM | POA: Diagnosis not present

## 2022-11-25 DIAGNOSIS — H401131 Primary open-angle glaucoma, bilateral, mild stage: Secondary | ICD-10-CM | POA: Diagnosis not present

## 2022-12-01 DIAGNOSIS — H35351 Cystoid macular degeneration, right eye: Secondary | ICD-10-CM | POA: Diagnosis not present

## 2022-12-01 DIAGNOSIS — Z961 Presence of intraocular lens: Secondary | ICD-10-CM | POA: Diagnosis not present

## 2022-12-01 DIAGNOSIS — H353122 Nonexudative age-related macular degeneration, left eye, intermediate dry stage: Secondary | ICD-10-CM | POA: Diagnosis not present

## 2022-12-01 DIAGNOSIS — H35371 Puckering of macula, right eye: Secondary | ICD-10-CM | POA: Diagnosis not present

## 2022-12-01 DIAGNOSIS — H353211 Exudative age-related macular degeneration, right eye, with active choroidal neovascularization: Secondary | ICD-10-CM | POA: Diagnosis not present

## 2022-12-01 DIAGNOSIS — H401132 Primary open-angle glaucoma, bilateral, moderate stage: Secondary | ICD-10-CM | POA: Diagnosis not present

## 2022-12-21 DIAGNOSIS — F331 Major depressive disorder, recurrent, moderate: Secondary | ICD-10-CM | POA: Diagnosis not present

## 2022-12-21 DIAGNOSIS — E669 Obesity, unspecified: Secondary | ICD-10-CM | POA: Diagnosis not present

## 2022-12-21 DIAGNOSIS — R1310 Dysphagia, unspecified: Secondary | ICD-10-CM | POA: Diagnosis not present

## 2022-12-21 DIAGNOSIS — E559 Vitamin D deficiency, unspecified: Secondary | ICD-10-CM | POA: Diagnosis not present

## 2022-12-21 DIAGNOSIS — I10 Essential (primary) hypertension: Secondary | ICD-10-CM | POA: Diagnosis not present

## 2022-12-21 DIAGNOSIS — E785 Hyperlipidemia, unspecified: Secondary | ICD-10-CM | POA: Diagnosis not present

## 2022-12-21 DIAGNOSIS — J449 Chronic obstructive pulmonary disease, unspecified: Secondary | ICD-10-CM | POA: Diagnosis not present

## 2022-12-22 DIAGNOSIS — L578 Other skin changes due to chronic exposure to nonionizing radiation: Secondary | ICD-10-CM | POA: Diagnosis not present

## 2022-12-22 DIAGNOSIS — D485 Neoplasm of uncertain behavior of skin: Secondary | ICD-10-CM | POA: Diagnosis not present

## 2022-12-22 DIAGNOSIS — D044 Carcinoma in situ of skin of scalp and neck: Secondary | ICD-10-CM | POA: Diagnosis not present

## 2022-12-22 DIAGNOSIS — D225 Melanocytic nevi of trunk: Secondary | ICD-10-CM | POA: Diagnosis not present

## 2022-12-22 DIAGNOSIS — C44719 Basal cell carcinoma of skin of left lower limb, including hip: Secondary | ICD-10-CM | POA: Diagnosis not present

## 2022-12-22 DIAGNOSIS — L821 Other seborrheic keratosis: Secondary | ICD-10-CM | POA: Diagnosis not present

## 2022-12-22 DIAGNOSIS — D2272 Melanocytic nevi of left lower limb, including hip: Secondary | ICD-10-CM | POA: Diagnosis not present

## 2022-12-22 DIAGNOSIS — L814 Other melanin hyperpigmentation: Secondary | ICD-10-CM | POA: Diagnosis not present

## 2022-12-22 DIAGNOSIS — L570 Actinic keratosis: Secondary | ICD-10-CM | POA: Diagnosis not present

## 2022-12-29 DIAGNOSIS — H35371 Puckering of macula, right eye: Secondary | ICD-10-CM | POA: Diagnosis not present

## 2022-12-29 DIAGNOSIS — H35351 Cystoid macular degeneration, right eye: Secondary | ICD-10-CM | POA: Diagnosis not present

## 2022-12-29 DIAGNOSIS — H353211 Exudative age-related macular degeneration, right eye, with active choroidal neovascularization: Secondary | ICD-10-CM | POA: Diagnosis not present

## 2022-12-29 DIAGNOSIS — H353122 Nonexudative age-related macular degeneration, left eye, intermediate dry stage: Secondary | ICD-10-CM | POA: Diagnosis not present

## 2023-01-27 DIAGNOSIS — H401131 Primary open-angle glaucoma, bilateral, mild stage: Secondary | ICD-10-CM | POA: Diagnosis not present

## 2023-01-30 DIAGNOSIS — C4442 Squamous cell carcinoma of skin of scalp and neck: Secondary | ICD-10-CM | POA: Diagnosis not present

## 2023-02-01 DIAGNOSIS — N289 Disorder of kidney and ureter, unspecified: Secondary | ICD-10-CM | POA: Diagnosis not present

## 2023-02-02 DIAGNOSIS — H35371 Puckering of macula, right eye: Secondary | ICD-10-CM | POA: Diagnosis not present

## 2023-02-02 DIAGNOSIS — H353122 Nonexudative age-related macular degeneration, left eye, intermediate dry stage: Secondary | ICD-10-CM | POA: Diagnosis not present

## 2023-02-02 DIAGNOSIS — H353211 Exudative age-related macular degeneration, right eye, with active choroidal neovascularization: Secondary | ICD-10-CM | POA: Diagnosis not present

## 2023-02-02 DIAGNOSIS — H35351 Cystoid macular degeneration, right eye: Secondary | ICD-10-CM | POA: Diagnosis not present

## 2023-02-20 DIAGNOSIS — Z5189 Encounter for other specified aftercare: Secondary | ICD-10-CM | POA: Diagnosis not present

## 2023-03-02 DIAGNOSIS — H353211 Exudative age-related macular degeneration, right eye, with active choroidal neovascularization: Secondary | ICD-10-CM | POA: Diagnosis not present

## 2023-03-02 DIAGNOSIS — H35371 Puckering of macula, right eye: Secondary | ICD-10-CM | POA: Diagnosis not present

## 2023-03-02 DIAGNOSIS — H35351 Cystoid macular degeneration, right eye: Secondary | ICD-10-CM | POA: Diagnosis not present

## 2023-03-02 DIAGNOSIS — H353122 Nonexudative age-related macular degeneration, left eye, intermediate dry stage: Secondary | ICD-10-CM | POA: Diagnosis not present

## 2023-04-06 DIAGNOSIS — H353211 Exudative age-related macular degeneration, right eye, with active choroidal neovascularization: Secondary | ICD-10-CM | POA: Diagnosis not present

## 2023-04-06 DIAGNOSIS — H35371 Puckering of macula, right eye: Secondary | ICD-10-CM | POA: Diagnosis not present

## 2023-04-19 DIAGNOSIS — T148XXA Other injury of unspecified body region, initial encounter: Secondary | ICD-10-CM | POA: Diagnosis not present

## 2023-04-28 DIAGNOSIS — F331 Major depressive disorder, recurrent, moderate: Secondary | ICD-10-CM | POA: Diagnosis not present

## 2023-04-28 DIAGNOSIS — I10 Essential (primary) hypertension: Secondary | ICD-10-CM | POA: Diagnosis not present

## 2023-04-28 DIAGNOSIS — E785 Hyperlipidemia, unspecified: Secondary | ICD-10-CM | POA: Diagnosis not present

## 2023-04-28 DIAGNOSIS — Z23 Encounter for immunization: Secondary | ICD-10-CM | POA: Diagnosis not present

## 2023-04-28 DIAGNOSIS — E2839 Other primary ovarian failure: Secondary | ICD-10-CM | POA: Diagnosis not present

## 2023-04-28 DIAGNOSIS — E669 Obesity, unspecified: Secondary | ICD-10-CM | POA: Diagnosis not present

## 2023-04-28 DIAGNOSIS — R053 Chronic cough: Secondary | ICD-10-CM | POA: Diagnosis not present

## 2023-04-28 DIAGNOSIS — E559 Vitamin D deficiency, unspecified: Secondary | ICD-10-CM | POA: Diagnosis not present

## 2023-04-28 DIAGNOSIS — G629 Polyneuropathy, unspecified: Secondary | ICD-10-CM | POA: Diagnosis not present

## 2023-04-28 DIAGNOSIS — Z0000 Encounter for general adult medical examination without abnormal findings: Secondary | ICD-10-CM | POA: Diagnosis not present

## 2023-04-28 DIAGNOSIS — J449 Chronic obstructive pulmonary disease, unspecified: Secondary | ICD-10-CM | POA: Diagnosis not present

## 2023-04-28 DIAGNOSIS — D692 Other nonthrombocytopenic purpura: Secondary | ICD-10-CM | POA: Diagnosis not present

## 2023-05-10 DIAGNOSIS — T1490XD Injury, unspecified, subsequent encounter: Secondary | ICD-10-CM | POA: Diagnosis not present

## 2023-05-25 DIAGNOSIS — H353211 Exudative age-related macular degeneration, right eye, with active choroidal neovascularization: Secondary | ICD-10-CM | POA: Diagnosis not present

## 2023-07-06 DIAGNOSIS — H353211 Exudative age-related macular degeneration, right eye, with active choroidal neovascularization: Secondary | ICD-10-CM | POA: Diagnosis not present

## 2023-08-18 DIAGNOSIS — H401131 Primary open-angle glaucoma, bilateral, mild stage: Secondary | ICD-10-CM | POA: Diagnosis not present

## 2023-08-31 DIAGNOSIS — H401132 Primary open-angle glaucoma, bilateral, moderate stage: Secondary | ICD-10-CM | POA: Diagnosis not present

## 2023-08-31 DIAGNOSIS — Z961 Presence of intraocular lens: Secondary | ICD-10-CM | POA: Diagnosis not present

## 2023-08-31 DIAGNOSIS — H35351 Cystoid macular degeneration, right eye: Secondary | ICD-10-CM | POA: Diagnosis not present

## 2023-08-31 DIAGNOSIS — H35371 Puckering of macula, right eye: Secondary | ICD-10-CM | POA: Diagnosis not present

## 2023-08-31 DIAGNOSIS — H353211 Exudative age-related macular degeneration, right eye, with active choroidal neovascularization: Secondary | ICD-10-CM | POA: Diagnosis not present

## 2023-08-31 DIAGNOSIS — H353122 Nonexudative age-related macular degeneration, left eye, intermediate dry stage: Secondary | ICD-10-CM | POA: Diagnosis not present

## 2023-11-16 DIAGNOSIS — H35371 Puckering of macula, right eye: Secondary | ICD-10-CM | POA: Diagnosis not present

## 2023-11-16 DIAGNOSIS — H353211 Exudative age-related macular degeneration, right eye, with active choroidal neovascularization: Secondary | ICD-10-CM | POA: Diagnosis not present

## 2023-12-22 DIAGNOSIS — R1319 Other dysphagia: Secondary | ICD-10-CM | POA: Diagnosis not present

## 2023-12-22 DIAGNOSIS — F331 Major depressive disorder, recurrent, moderate: Secondary | ICD-10-CM | POA: Diagnosis not present

## 2023-12-22 DIAGNOSIS — J449 Chronic obstructive pulmonary disease, unspecified: Secondary | ICD-10-CM | POA: Diagnosis not present

## 2023-12-22 DIAGNOSIS — R748 Abnormal levels of other serum enzymes: Secondary | ICD-10-CM | POA: Diagnosis not present

## 2023-12-22 DIAGNOSIS — I10 Essential (primary) hypertension: Secondary | ICD-10-CM | POA: Diagnosis not present

## 2023-12-22 DIAGNOSIS — G629 Polyneuropathy, unspecified: Secondary | ICD-10-CM | POA: Diagnosis not present

## 2024-01-11 ENCOUNTER — Encounter (HOSPITAL_COMMUNITY): Payer: Self-pay

## 2024-01-11 ENCOUNTER — Inpatient Hospital Stay (HOSPITAL_COMMUNITY)
Admission: EM | Admit: 2024-01-11 | Discharge: 2024-01-15 | DRG: 193 | Disposition: A | Discharge status: Home / Self Care | Source: Ambulatory Visit | Attending: Internal Medicine | Admitting: Internal Medicine

## 2024-01-11 ENCOUNTER — Emergency Department (HOSPITAL_COMMUNITY)

## 2024-01-11 ENCOUNTER — Other Ambulatory Visit: Payer: Self-pay

## 2024-01-11 DIAGNOSIS — J441 Chronic obstructive pulmonary disease with (acute) exacerbation: Secondary | ICD-10-CM | POA: Diagnosis present

## 2024-01-11 DIAGNOSIS — M159 Polyosteoarthritis, unspecified: Secondary | ICD-10-CM | POA: Diagnosis present

## 2024-01-11 DIAGNOSIS — Z96651 Presence of right artificial knee joint: Secondary | ICD-10-CM | POA: Diagnosis not present

## 2024-01-11 DIAGNOSIS — F419 Anxiety disorder, unspecified: Secondary | ICD-10-CM | POA: Diagnosis present

## 2024-01-11 DIAGNOSIS — Z7982 Long term (current) use of aspirin: Secondary | ICD-10-CM | POA: Diagnosis not present

## 2024-01-11 DIAGNOSIS — Z79899 Other long term (current) drug therapy: Secondary | ICD-10-CM

## 2024-01-11 DIAGNOSIS — R7989 Other specified abnormal findings of blood chemistry: Secondary | ICD-10-CM | POA: Diagnosis not present

## 2024-01-11 DIAGNOSIS — J189 Pneumonia, unspecified organism: Principal | ICD-10-CM | POA: Diagnosis present

## 2024-01-11 DIAGNOSIS — Z86711 Personal history of pulmonary embolism: Secondary | ICD-10-CM | POA: Diagnosis not present

## 2024-01-11 DIAGNOSIS — J449 Chronic obstructive pulmonary disease, unspecified: Secondary | ICD-10-CM | POA: Diagnosis present

## 2024-01-11 DIAGNOSIS — J440 Chronic obstructive pulmonary disease with acute lower respiratory infection: Secondary | ICD-10-CM | POA: Diagnosis present

## 2024-01-11 DIAGNOSIS — Z800 Family history of malignant neoplasm of digestive organs: Secondary | ICD-10-CM

## 2024-01-11 DIAGNOSIS — R062 Wheezing: Secondary | ICD-10-CM | POA: Diagnosis not present

## 2024-01-11 DIAGNOSIS — Z96641 Presence of right artificial hip joint: Secondary | ICD-10-CM | POA: Diagnosis present

## 2024-01-11 DIAGNOSIS — J9621 Acute and chronic respiratory failure with hypoxia: Secondary | ICD-10-CM | POA: Diagnosis present

## 2024-01-11 DIAGNOSIS — R0602 Shortness of breath: Secondary | ICD-10-CM | POA: Diagnosis not present

## 2024-01-11 DIAGNOSIS — R591 Generalized enlarged lymph nodes: Secondary | ICD-10-CM | POA: Diagnosis not present

## 2024-01-11 DIAGNOSIS — E785 Hyperlipidemia, unspecified: Secondary | ICD-10-CM | POA: Diagnosis not present

## 2024-01-11 DIAGNOSIS — R051 Acute cough: Secondary | ICD-10-CM | POA: Diagnosis not present

## 2024-01-11 DIAGNOSIS — R000 Tachycardia, unspecified: Secondary | ICD-10-CM | POA: Diagnosis not present

## 2024-01-11 DIAGNOSIS — Z1152 Encounter for screening for COVID-19: Secondary | ICD-10-CM

## 2024-01-11 DIAGNOSIS — F32A Depression, unspecified: Secondary | ICD-10-CM | POA: Diagnosis present

## 2024-01-11 DIAGNOSIS — E876 Hypokalemia: Secondary | ICD-10-CM | POA: Diagnosis present

## 2024-01-11 DIAGNOSIS — Z880 Allergy status to penicillin: Secondary | ICD-10-CM

## 2024-01-11 DIAGNOSIS — J9601 Acute respiratory failure with hypoxia: Principal | ICD-10-CM | POA: Diagnosis present

## 2024-01-11 DIAGNOSIS — R059 Cough, unspecified: Secondary | ICD-10-CM | POA: Diagnosis not present

## 2024-01-11 DIAGNOSIS — Z794 Long term (current) use of insulin: Secondary | ICD-10-CM | POA: Diagnosis not present

## 2024-01-11 DIAGNOSIS — R079 Chest pain, unspecified: Secondary | ICD-10-CM | POA: Diagnosis not present

## 2024-01-11 DIAGNOSIS — I10 Essential (primary) hypertension: Secondary | ICD-10-CM | POA: Diagnosis present

## 2024-01-11 DIAGNOSIS — Z7951 Long term (current) use of inhaled steroids: Secondary | ICD-10-CM | POA: Diagnosis not present

## 2024-01-11 DIAGNOSIS — R0902 Hypoxemia: Secondary | ICD-10-CM | POA: Diagnosis not present

## 2024-01-11 DIAGNOSIS — Z87891 Personal history of nicotine dependence: Secondary | ICD-10-CM | POA: Diagnosis not present

## 2024-01-11 DIAGNOSIS — R918 Other nonspecific abnormal finding of lung field: Secondary | ICD-10-CM | POA: Diagnosis not present

## 2024-01-11 LAB — D-DIMER, QUANTITATIVE: D-Dimer, Quant: 1.01 ug{FEU}/mL — ABNORMAL HIGH (ref 0.00–0.50)

## 2024-01-11 LAB — BASIC METABOLIC PANEL WITH GFR
Anion gap: 18 — ABNORMAL HIGH (ref 5–15)
BUN: 16 mg/dL (ref 8–23)
CO2: 23 mmol/L (ref 22–32)
Calcium: 8.8 mg/dL — ABNORMAL LOW (ref 8.9–10.3)
Chloride: 94 mmol/L — ABNORMAL LOW (ref 98–111)
Creatinine, Ser: 1.03 mg/dL — ABNORMAL HIGH (ref 0.44–1.00)
GFR, Estimated: 57 mL/min — ABNORMAL LOW (ref >60)
Glucose, Bld: 136 mg/dL — ABNORMAL HIGH (ref 70–99)
Potassium: 3.4 mmol/L — ABNORMAL LOW (ref 3.5–5.1)
Sodium: 135 mmol/L (ref 135–145)

## 2024-01-11 LAB — I-STAT VENOUS BLOOD GAS, ED
Acid-Base Excess: 3 mmol/L — ABNORMAL HIGH (ref 0.0–2.0)
Bicarbonate: 27.1 mmol/L (ref 20.0–28.0)
Calcium, Ion: 1.08 mmol/L — ABNORMAL LOW (ref 1.15–1.40)
HCT: 39 % (ref 36.0–46.0)
Hemoglobin: 13.3 g/dL (ref 12.0–15.0)
O2 Saturation: 80 %
Potassium: 3.5 mmol/L (ref 3.5–5.1)
Sodium: 132 mmol/L — ABNORMAL LOW (ref 135–145)
TCO2: 28 mmol/L (ref 22–32)
pCO2, Ven: 39.8 mmHg — ABNORMAL LOW (ref 44–60)
pH, Ven: 7.442 — ABNORMAL HIGH (ref 7.25–7.43)
pO2, Ven: 43 mmHg (ref 32–45)

## 2024-01-11 LAB — CBC
HCT: 41.1 % (ref 36.0–46.0)
Hemoglobin: 13.5 g/dL (ref 12.0–15.0)
MCH: 31.7 pg (ref 26.0–34.0)
MCHC: 32.8 g/dL (ref 30.0–36.0)
MCV: 96.5 fL (ref 80.0–100.0)
Platelets: 217 10*3/uL (ref 150–400)
RBC: 4.26 MIL/uL (ref 3.87–5.11)
RDW: 12.9 % (ref 11.5–15.5)
WBC: 10.8 10*3/uL — ABNORMAL HIGH (ref 4.0–10.5)
nRBC: 0 % (ref 0.0–0.2)

## 2024-01-11 LAB — TROPONIN I (HIGH SENSITIVITY)
Troponin I (High Sensitivity): 10 ng/L (ref <18)
Troponin I (High Sensitivity): 8 ng/L (ref <18)

## 2024-01-11 LAB — BRAIN NATRIURETIC PEPTIDE: B Natriuretic Peptide: 48.9 pg/mL (ref 0.0–100.0)

## 2024-01-11 MED ORDER — ALBUTEROL SULFATE (2.5 MG/3ML) 0.083% IN NEBU
INHALATION_SOLUTION | RESPIRATORY_TRACT | Status: AC
  Filled 2024-01-11: qty 12

## 2024-01-11 MED ORDER — SODIUM CHLORIDE 0.9 % IV BOLUS
1000.0000 mL | Freq: Once | INTRAVENOUS | Status: AC
  Administered 2024-01-11: 1000 mL via INTRAVENOUS

## 2024-01-11 MED ORDER — IOHEXOL 350 MG/ML SOLN
65.0000 mL | Freq: Once | INTRAVENOUS | Status: AC | PRN
  Administered 2024-01-11: 65 mL via INTRAVENOUS

## 2024-01-11 MED ORDER — SODIUM CHLORIDE 0.9 % IV SOLN
500.0000 mg | Freq: Once | INTRAVENOUS | Status: AC
  Administered 2024-01-11: 500 mg via INTRAVENOUS
  Filled 2024-01-11: qty 5

## 2024-01-11 MED ORDER — ALBUTEROL (5 MG/ML) CONTINUOUS INHALATION SOLN
10.0000 mg/h | INHALATION_SOLUTION | Freq: Once | RESPIRATORY_TRACT | Status: AC
  Administered 2024-01-11: 10 mg/h via RESPIRATORY_TRACT
  Filled 2024-01-11: qty 20

## 2024-01-11 MED ORDER — SODIUM CHLORIDE 0.9 % IV SOLN
2.0000 g | Freq: Once | INTRAVENOUS | Status: AC
  Administered 2024-01-11: 2 g via INTRAVENOUS
  Filled 2024-01-11: qty 20

## 2024-01-11 NOTE — ED Notes (Signed)
Patient was given a cup of ginger ale. 

## 2024-01-11 NOTE — ED Notes (Signed)
 Pt taken to CT.

## 2024-01-11 NOTE — ED Notes (Addendum)
 Pt taken to xray

## 2024-01-11 NOTE — ED Triage Notes (Signed)
 Pt to er via ems, per ems pt was at doctor office being seen for some sob and cough/cold, states that she went there because she has been ill since last week.  Per ems pt was found with a low O2 sat, given 2 duo nebs, solumedrol and 2 of mag.  Pt has some accessory muscle use, pt talking in full sentences on neb.

## 2024-01-11 NOTE — ED Provider Notes (Signed)
 [facility] AT [facility] Provider Note  CSN: [id number] Arrival date & time: 01/11/24 1536  Chief Complaint(s) Shortness of Breath  HPI Gina Petty is a 78 y.o. female prior history of severe pneumonia, hypertension, hyperlipoidemia, not on chronic oxygen presenting to the emergency department shortness of breath.  Patient reports shortness of breath for about a week which has been worsening.  Reports associated cough which is nonproductive.  No fevers or chills.  Reports some chest pain with coughing but no pleuritic pain or pain at rest.  No nausea, vomiting.  No abdominal pain.  Went to primary office, and to be very short of breath, paramedics were called.  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 Authorizing Provider  albuterol  (PROVENTIL ) (2.5 MG/3ML) 0.083% nebulizer solution Take 3 mLs (2.5 mg total) by nebulization every 2 (two) hours as needed for wheezing. 10/25/21    Gleason, Leita SAUNDERS, PA-C  arformoterol  (BROVANA ) 15 MCG/2ML NEBU Take 2 mLs (15 mcg total) by nebulization 2 (two) times daily. 10/25/21   Gleason, Leita SAUNDERS, PA-C  Ascorbic Acid (VITAMIN C) 500 MG CHEW Chew 500 mg by mouth daily.    [provider]  aspirin  81 MG chewable tablet Chew 1 tablet (81 mg total) by mouth 2 (two) times daily. 10/09/19   Nida, Andrew, PA-C  b complex vitamins tablet Take 1 tablet by mouth daily.    [provider]  budesonide  (PULMICORT ) 0.5 MG/2ML nebulizer solution Take 2 mLs (0.5 mg total) by nebulization 2 (two) times daily. 10/25/21   Gleason, Leita SAUNDERS, PA-C  buPROPion  (WELLBUTRIN  XL) 150 MG 24 hr tablet Take 150 mg by mouth daily.    [provider]  clonazePAM  (KLONOPIN ) 0.5 MG tablet Place 1 tablet (0.5 mg total) into feeding tube 2 (two) times daily. 10/25/21   Gleason, Leita SAUNDERS, PA-C  dexmedetomidine  (PRECEDEX ) 400 MCG/100ML SOLN Inject 35.8-107.4 mcg/hr into the vein continuous. 10/25/21   Gleason, Leita SAUNDERS, PA-C  docusate (COLACE) 50 MG/5ML liquid Place 10 mLs (100 mg total) into feeding tube 2 (two) times daily. 10/25/21   Gleason, Leita SAUNDERS, PA-C  docusate sodium  (COLACE) 100 MG capsule Take 1 capsule (100 mg total) by mouth 2 (two) times daily as needed for mild constipation. 10/25/21   Gleason, Leita SAUNDERS, PA-C  enoxaparin  (LOVENOX ) 40 MG/0.4ML injection Inject 0.4 mLs (40 mg total) into the skin daily. 10/26/21   Gleason, Leita SAUNDERS, PA-C  furosemide  (LASIX ) 10 MG/ML injection Inject  4 mLs (40 mg total) into the vein daily. 10/25/21   Gleason, Leita SAUNDERS, PA-C  haloperidol  lactate (HALDOL ) 5 MG/ML injection Inject 1 mL (5 mg total) into the vein every 6 (six) hours as needed. 10/25/21   Gleason, Leita SAUNDERS, PA-C  insulin  aspart (NOVOLOG ) 100 UNIT/ML injection Inject 2-6 Units into the skin every 4 (four) hours. 10/25/21   Gleason, Leita SAUNDERS, PA-C  ipratropium (ATROVENT ) 0.02 % nebulizer solution Take 2.5 mLs (0.5 mg total) by nebulization every 2 (two) hours as  needed for wheezing. 10/25/21   Gleason, Leita SAUNDERS, PA-C  lip balm (CARMEX) ointment Apply topically as needed for lip care. 10/25/21   Gleason, Leita SAUNDERS, PA-C  liver oil-zinc  oxide (DESITIN) 40 % ointment Apply topically 2 (two) times daily. 10/25/21   Gleason, Leita SAUNDERS, PA-C  Multiple Vitamin (MULTIVITAMIN WITH MINERALS) TABS tablet Take 1 tablet by mouth daily.    [provider]  Nutritional Supplements (FEEDING SUPPLEMENT, OSMOLITE 1.2 CAL,) LIQD Place 1,000 mLs into feeding tube continuous. 10/25/21   Gleason, Leita SAUNDERS, PA-C  Nutritional Supplements (FEEDING SUPPLEMENT, PROSOURCE TF,) liquid Place 90 mLs into feeding tube 2 (two) times daily. 10/25/21   Gleason, Leita SAUNDERS, PA-C  ondansetron  (ZOFRAN ) 4 MG/2ML SOLN injection Inject 2 mLs (4 mg total) into the vein every 6 (six) hours as needed for nausea. 10/25/21   Gleason, Leita SAUNDERS, PA-C  oxyCODONE  (ROXICODONE ) 5 MG/5ML solution Place 5 mLs (5 mg total) into feeding tube every 6 (six) hours. 10/25/21   Gleason, Leita SAUNDERS, PA-C  pantoprazole  sodium (PROTONIX ) 40 mg Place 40 mg into feeding tube at bedtime. 10/25/21   Gleason, Leita SAUNDERS, PA-C  polyethylene glycol (MIRALAX  / GLYCOLAX ) 17 g packet Place 17 g into feeding tube daily. 10/26/21   Gleason, Leita SAUNDERS, PA-C  potassium chloride  (KLOR-CON ) 20 MEQ packet Place 40 mEq into feeding tube daily. 10/26/21   Gleason, Leita SAUNDERS, PA-C                                                                                                                                    Past Surgical History Past Surgical History:  Procedure Laterality Date   BACK SURGERY  85   CATARACT EXTRACTION     COLONOSCOPY  06/12/2006 last    IR GASTROSTOMY TUBE MOD SED  12/01/2021   IR GASTROSTOMY TUBE REMOVAL  02/02/2022   POLYPECTOMY     TOTAL HIP ARTHROPLASTY Right 10/08/2019   Procedure: RIGHT TOTAL HIP ARTHROPLASTY ANTERIOR APPROACH;  Surgeon: Sheril Coy, MD;  Location: [facility];  Service: Orthopedics;  Laterality: Right;   TOTAL KNEE  ARTHROPLASTY Right 11/26/2013   DR DALLDORF   TOTAL KNEE ARTHROPLASTY Right 11/26/2013   Procedure: TOTAL KNEE ARTHROPLASTY;  Surgeon: Coy KANDICE Sheril, MD;  Location: [facility] OR;  Service: Orthopedics;  Laterality: Right;  Right total knee arthroplasty   Family History Family History  Problem Relation Age of  Onset   Colon cancer Brother 55   Colon polyps Neg Hx    Esophageal cancer Neg Hx    Prostate cancer Neg Hx    Rectal cancer Neg Hx     Social History Social History   Tobacco Use   Smoking status: Former    Current packs/day: 0.00    Average packs/day: 1 pack/day for 25.0 years (25.0 ttl pk-yrs)    Types: Cigarettes    Start date: 11/20/1975    Quit date: 11/19/2000    Years since quitting: 23.1   Smokeless tobacco: Never  Vaping Use   Vaping status: Never Used  Substance Use Topics   Alcohol use: Yes    Alcohol/week: 7.0 standard drinks of alcohol    Types: 7 Glasses of wine per week    Comment: DAILY   Drug use: No   Allergies Penicillins  Review of Systems Review of Systems  All other systems reviewed and are negative.   Physical Exam Vital Signs  I have reviewed the triage vital signs BP 115/63   Pulse (!) 108   Temp 98.5 F (36.9 C) (Oral)   Resp (!) 24   Ht 5' 6 (1.676 m)   Wt 72.6 kg   SpO2 93%   BMI 25.82 kg/m  Physical Exam Vitals and nursing note reviewed.  Constitutional:      General: She is not in acute distress.    Appearance: She is well-developed.  HENT:     Head: Normocephalic and atraumatic.     Mouth/Throat:     Mouth: Mucous membranes are moist.   Eyes:     Pupils: Pupils are equal, round, and reactive to light.    Cardiovascular:     Rate and Rhythm: Normal rate and regular rhythm.     Heart sounds: No murmur heard. Pulmonary:     Effort: Tachypnea, accessory muscle usage and respiratory distress (mild) present.     Breath sounds: Examination of the right-lower field reveals wheezing and rales. Examination of the left-lower  field reveals wheezing and rales. Wheezing and rales present.  Abdominal:     General: Abdomen is flat.     Palpations: Abdomen is soft.     Tenderness: There is no abdominal tenderness.   Musculoskeletal:        General: No tenderness.     Right lower leg: No edema.     Left lower leg: No edema.   Skin:    General: Skin is warm and dry.   Neurological:     General: No focal deficit present.     Mental Status: She is alert. Mental status is at baseline.   Psychiatric:        Mood and Affect: Mood normal.        Behavior: Behavior normal.     ED Results and Treatments Labs (all labs ordered are listed, but only abnormal results are displayed) Labs Reviewed  BASIC METABOLIC PANEL WITH GFR - Abnormal; Notable for the following components:      Result Value   Potassium 3.4 (*)    Chloride 94 (*)    Glucose, Bld 136 (*)    Creatinine, Ser 1.03 (*)    Calcium 8.8 (*)    GFR, Estimated 57 (*)    Anion gap 18 (*)    All other components within normal limits  CBC - Abnormal; Notable for the following components:   WBC 10.8 (*)    All other components within normal limits  D-DIMER, QUANTITATIVE - Abnormal; Notable for the following components:   D-Dimer, Quant 1.01 (*)    All other components within normal limits  I-STAT VENOUS BLOOD GAS, ED - Abnormal; Notable for the following components:   pH, Ven 7.442 (*)    pCO2, Ven 39.8 (*)    Acid-Base Excess 3.0 (*)    Sodium 132 (*)    Calcium, Ion 1.08 (*)    All other components within normal limits  CULTURE, BLOOD (ROUTINE X 2)  CULTURE, BLOOD (ROUTINE X 2)  BRAIN NATRIURETIC PEPTIDE  TROPONIN I (HIGH SENSITIVITY)  TROPONIN I (HIGH SENSITIVITY)                                                                                                                          Radiology CT Angio Chest PE W and/or Wo Contrast Result Date: 01/11/2024 CLINICAL DATA:  Positive D-dimer and shortness of breath. EXAM: CT ANGIOGRAPHY CHEST  WITH CONTRAST TECHNIQUE: Multidetector CT imaging of the chest was performed using the standard protocol during bolus administration of intravenous contrast. Multiplanar CT image reconstructions and MIPs were obtained to evaluate the vascular anatomy. RADIATION DOSE REDUCTION: This exam was performed according to the departmental dose-optimization program which includes automated exposure control, adjustment of the mA and/or kV according to patient size and/or use of iterative reconstruction technique. CONTRAST:  65mL OMNIPAQUE  IOHEXOL  350 MG/ML SOLN COMPARISON:  Chest CT 10/11/2021. FINDINGS: Cardiovascular: Satisfactory opacification of the pulmonary arteries to the segmental level. No evidence of pulmonary embolism. Normal heart size. No pericardial effusion. There are atherosclerotic calcifications of the aorta. Mediastinum/Nodes: There are enlarged subcarinal lymph nodes measuring up to 10 mm, similar to prior. There are enlarged right hilar lymph nodes measuring up to 15 mm, similar to prior. There are prominent left hilar, prevascular and AP window lymph nodes. There also prominent paratracheal lymph nodes. These have slightly increased in size and number when compared to the prior study. Visualized esophagus and thyroid  gland are within normal limits. Lungs/Pleura: Severe emphysema again seen. Previously identified areas of airspace consolidation bilaterally have resolved. There are patchy ground-glass and tree-in-bud opacities throughout both lungs in the mid and lower lung predominance, new from prior. There is no pleural effusion or pneumothorax. Upper Abdomen: No acute abnormality. Musculoskeletal: No chest Frary abnormality. No acute or significant osseous findings. Review of the MIP images confirms the above findings. IMPRESSION: 1. No evidence for pulmonary embolism. 2. Resolution of bilateral airspace consolidation. 3. New patchy ground-glass and tree-in-bud opacities throughout both lungs in the mid  and lower lung predominance, compatible with multifocal pneumonia. 4. Stable severe emphysema. 5. Increasing mediastinal lymphadenopathy. Aortic Atherosclerosis (ICD10-I70.0) and Emphysema (ICD10-J43.9). Electronically Signed   By: Greig Pique M.D.   On: 01/11/2024 19:58   DG Chest 2 View Result Date: 01/11/2024 CLINICAL DATA:  Chest pain EXAM: CHEST - 2 VIEW COMPARISON:  Chest x-ray 11/29/2021 FINDINGS: There is scattered interstitial opacities in the lung bases, possibly chronic. There is  no pleural effusion or pneumothorax. The cardiomediastinal silhouette is within normal limits. Emphysematous changes again noted. No acute osseous abnormality. IMPRESSION: 1. Scattered interstitial opacities in the lung bases, possibly chronic. 2. Emphysematous changes. Electronically Signed   By: Greig Pique M.D.   On: 01/11/2024 16:57    Pertinent labs & imaging results that were available during my care of the patient were reviewed by me and considered in my medical decision making (see MDM for details).  Medications Ordered in ED Medications  albuterol  (PROVENTIL ) (2.5 MG/3ML) 0.083% nebulizer solution (  Not Given 01/11/24 1705)  cefTRIAXone (ROCEPHIN) 2 g in sodium chloride  0.9 % 100 mL IVPB (2 g Intravenous New Bag/Given 01/11/24 2042)  azithromycin  (ZITHROMAX ) 500 mg in sodium chloride  0.9 % 250 mL IVPB (has no administration in time range)  sodium chloride  0.9 % bolus 1,000 mL (0 mLs Intravenous Stopped 01/11/24 1804)  albuterol  (PROVENTIL ,VENTOLIN ) solution continuous neb (10 mg/hr Nebulization Given 01/11/24 1700)  iohexol  (OMNIPAQUE ) 350 MG/ML injection 65 mL (65 mLs Intravenous Contrast Given 01/11/24 1907)                                                                                                                                     Procedures .Critical Care  Performed by: Francesca Elsie CROME, MD Authorized by: Francesca Elsie CROME, MD   Critical care provider statement:    Critical care  time (minutes):  30   Critical care was necessary to treat or prevent imminent or life-threatening deterioration of the following conditions:  Respiratory failure   Critical care was time spent personally by me on the following activities:  Development of treatment plan with patient or surrogate, discussions with consultants, evaluation of patient's response to treatment, examination of patient, ordering and review of laboratory studies, ordering and review of radiographic studies, ordering and performing treatments and interventions, pulse oximetry, re-evaluation of patient's condition and review of old charts   Care discussed with: admitting provider     (including critical care time)  Medical Decision Making / ED Course   MDM:  78 year old presenting to the emergency room shortness of breath.  Patient mildly ill-appearing, mild respiratory distress with tachypnea.  Does have some basilar crackles and wheeze.  Differential includes pneumonia, COPD, ILD, pulmonary embolism, CHF.  Will check labs including D-dimer, BNP.  Will obtain chest x-ray.  She does feel better after breathing treatment.  Patient was hypoxic on room air after removal of oxygen.  And up needing admission for further management.  Did have prolonged complicated pneumonia course 2 years ago requiring tracheostomy however has improved subsequently and decannulated      Clinical Course as of 01/11/24 2111  Thu Jan 11, 2024  2109 CTA chest without signs of PE but does show multifocal pneumonia.  Have started patient on ceftriaxone and azithromycin .  Will do blood cultures.  Signed out to Dr. Sim.  Patient will need to  be admitted given her hypoxia [WS]    Clinical Course User Index [WS] Francesca Elsie CROME, MD     Additional history obtained: -Additional history obtained from ems -External records from outside source obtained and reviewed including: Chart review including previous notes, labs, imaging, consultation  notes including prior admission with severe pneumonia   Lab Tests: -I ordered, reviewed, and interpreted labs.   The pertinent results include:   Labs Reviewed  BASIC METABOLIC PANEL WITH GFR - Abnormal; Notable for the following components:      Result Value   Potassium 3.4 (*)    Chloride 94 (*)    Glucose, Bld 136 (*)    Creatinine, Ser 1.03 (*)    Calcium 8.8 (*)    GFR, Estimated 57 (*)    Anion gap 18 (*)    All other components within normal limits  CBC - Abnormal; Notable for the following components:   WBC 10.8 (*)    All other components within normal limits  D-DIMER, QUANTITATIVE - Abnormal; Notable for the following components:   D-Dimer, Quant 1.01 (*)    All other components within normal limits  I-STAT VENOUS BLOOD GAS, ED - Abnormal; Notable for the following components:   pH, Ven 7.442 (*)    pCO2, Ven 39.8 (*)    Acid-Base Excess 3.0 (*)    Sodium 132 (*)    Calcium, Ion 1.08 (*)    All other components within normal limits  CULTURE, BLOOD (ROUTINE X 2)  CULTURE, BLOOD (ROUTINE X 2)  BRAIN NATRIURETIC PEPTIDE  TROPONIN I (HIGH SENSITIVITY)  TROPONIN I (HIGH SENSITIVITY)    Notable for leukocytosis, positive d-dimer  EKG   EKG Interpretation Date/Time:  Thursday January 11 2024 16:02:53 EDT Ventricular Rate:  115 PR Interval:  196 QRS Duration:  63 QT Interval:  367 QTC Calculation: 508 R Axis:   68  Text Interpretation: Sinus tachycardia Biatrial enlargement Borderline T abnormalities, diffuse leads Prolonged QT interval Confirmed by Francesca Elsie ([id number]) on 01/11/2024 4:54:27 PM         Imaging Studies ordered: I ordered imaging studies including CTA chest On my interpretation imaging demonstrates multifocal pna without PE  I independently visualized and interpreted imaging. I agree with the radiologist interpretation   Medicines ordered and prescription drug management: Meds ordered this encounter  Medications   sodium chloride  0.9  % bolus 1,000 mL   albuterol  (PROVENTIL ,VENTOLIN ) solution continuous neb   albuterol  (PROVENTIL ) (2.5 MG/3ML) 0.083% nebulizer solution    Leontine Harry V: cabinet override   iohexol  (OMNIPAQUE ) 350 MG/ML injection 65 mL   cefTRIAXone (ROCEPHIN) 2 g in sodium chloride  0.9 % 100 mL IVPB    Antibiotic Indication::   CAP   azithromycin  (ZITHROMAX ) 500 mg in sodium chloride  0.9 % 250 mL IVPB    -I have reviewed the patients home medicines and have made adjustments as needed   Consultations Obtained: I requested consultation with the hospitalist,  and discussed lab and imaging findings as well as pertinent plan - they recommend: admission   Cardiac Monitoring: The patient was maintained on a cardiac monitor.  I personally viewed and interpreted the cardiac monitored which showed an underlying rhythm of: sinus rhythm   Social Determinants of Health:  Diagnosis or treatment significantly limited by social determinants of health: former smoker   Reevaluation: After the interventions noted above, I reevaluated the patient and found that their symptoms have improved  Co morbidities that complicate the patient evaluation  Past  Medical History:  Diagnosis Date   Arthritis    Depression    Hypertension    PE (pulmonary embolism)    posssible 74  not able to prove      Dispostion: Disposition decision including need for hospitalization was considered, and patient admitted to the hospital.    Final Clinical Impression(s) / ED Diagnoses Final diagnoses:  Multifocal pneumonia  COPD exacerbation (HCC)  Acute hypoxic respiratory failure (HCC)     This chart was dictated using voice recognition software.  Despite best efforts to proofread,  errors can occur which can change the documentation meaning.    Francesca Elsie CROME, MD 01/11/24 2111

## 2024-01-11 NOTE — ED Notes (Signed)
 Lab to add BNP onto existing blood.

## 2024-01-11 NOTE — H&P (Signed)
 History and Physical    Patient: Gina Petty FMW:[id number] DOB: 09-18-1945 DOA: 01/11/2024 DOS: the patient was seen and examined on 01/11/2024 PCP: Teresa Channel, MD  Patient coming from: Home  Chief Complaint:  Chief Complaint  Patient presents with   Shortness of Breath   HPI: Gina Petty is a 78 y.o. female with medical history significant of COPD, previous pulmonary embolism, essential hypertension, depression, osteoarthritis, history of previous tracheostomy and gastrostomy tube, who was at her PCPs office today with complaint of shortness of breath cough and wheezing.  This been going on gradually since last week.  Patient is not on home O2.  She was evaluated and found to have low oxygen at the PCPs office.  She was also wheezing.  Patient was given some DuoNebs, Solu-Medrol  and magnesium  IV.  EMS brought patient to the ER with shortness of breath.  In the ER patient has been evaluated.  CT shows multifocal pneumonia.  Patient being admitted for management of acute hypoxic respiratory failure secondary to multifocal pneumonia and possibly COPD exacerbation.  Patient continues to cough.  No hemoptysis.  The cough is productive of white sputum.  Review of Systems: As mentioned in the history of present illness. All other systems reviewed and are negative. Past Medical History:  Diagnosis Date   Arthritis    Depression    Hypertension    PE (pulmonary embolism)    posssible 74  not able to prove   Past Surgical History:  Procedure Laterality Date   BACK SURGERY  85   CATARACT EXTRACTION     COLONOSCOPY  06/12/2006 last    IR GASTROSTOMY TUBE MOD SED  12/01/2021   IR GASTROSTOMY TUBE REMOVAL  02/02/2022   POLYPECTOMY     TOTAL HIP ARTHROPLASTY Right 10/08/2019   Procedure: RIGHT TOTAL HIP ARTHROPLASTY ANTERIOR APPROACH;  Surgeon: Sheril Coy, MD;  Location: [facility];  Service: Orthopedics;  Laterality: Right;   TOTAL KNEE ARTHROPLASTY Right 11/26/2013   DR DALLDORF   TOTAL  KNEE ARTHROPLASTY Right 11/26/2013   Procedure: TOTAL KNEE ARTHROPLASTY;  Surgeon: Coy KANDICE Sheril, MD;  Location: [facility];  Service: Orthopedics;  Laterality: Right;  Right total knee arthroplasty   Social History:  reports that she quit smoking about 23 years ago. Her smoking use included cigarettes. She started smoking about 48 years ago. She has a 25 pack-year smoking history. She has never used smokeless tobacco. She reports current alcohol use of about 7.0 standard drinks of alcohol per week. She reports that she does not use drugs.  Allergies  Allergen Reactions   Penicillins Rash    Details not specified Tolerates Ancef     Family History  Problem Relation Age of Onset   Colon cancer Brother 35   Colon polyps Neg Hx    Esophageal cancer Neg Hx    Prostate cancer Neg Hx    Rectal cancer Neg Hx     Prior to Admission medications   Medication Sig Start Date End Date Taking? Authorizing Provider  busPIRone (BUSPAR) 7.5 MG tablet Take 7.5 mg by mouth at bedtime. 12/18/23  Yes [provider]  dorzolamide (TRUSOPT) 2 % ophthalmic solution Place 1 drop into both eyes 2 (two) times daily. 11/21/23  Yes [provider]  DULoxetine (CYMBALTA) 30 MG capsule Take 30 mg by mouth daily. 12/23/23  Yes [provider]  valsartan-hydrochlorothiazide  (DIOVAN-HCT) 160-25 MG tablet Take 1 tablet by mouth daily. 12/18/23  Yes [provider]  Acetaminophen  500  MG capsule Take 1 capsule by mouth every 6 (six) hours as needed for pain or fever.    [provider]  albuterol  (PROVENTIL ) (2.5 MG/3ML) 0.083% nebulizer solution Take 3 mLs (2.5 mg total) by nebulization every 2 (two) hours as needed for wheezing. 10/25/21   Gleason, Leita SAUNDERS, PA-C  amoxicillin (AMOXIL) 500 MG capsule Take 2,000 mg by mouth once as needed (dental prophylaxis).    [provider]  arformoterol  (BROVANA ) 15 MCG/2ML NEBU Take 2 mLs (15 mcg total) by nebulization 2 (two) times daily.  10/25/21   Gleason, Leita SAUNDERS, PA-C  Ascorbic Acid (VITAMIN C) 500 MG CHEW Chew 500 mg by mouth daily.    [provider]  aspirin  81 MG chewable tablet Chew 1 tablet (81 mg total) by mouth 2 (two) times daily. 10/09/19   Nida, Andrew, PA-C  b complex vitamins tablet Take 1 tablet by mouth daily.    [provider]  budesonide  (PULMICORT ) 0.5 MG/2ML nebulizer solution Take 2 mLs (0.5 mg total) by nebulization 2 (two) times daily. 10/25/21   Gleason, Leita SAUNDERS, PA-C  buPROPion  (WELLBUTRIN  XL) 150 MG 24 hr tablet Take 150 mg by mouth daily.    [provider]  clonazePAM  (KLONOPIN ) 0.5 MG tablet Place 1 tablet (0.5 mg total) into feeding tube 2 (two) times daily. 10/25/21   Gleason, Leita SAUNDERS, PA-C  dexmedetomidine  (PRECEDEX ) 400 MCG/100ML SOLN Inject 35.8-107.4 mcg/hr into the vein continuous. 10/25/21   Gleason, Leita SAUNDERS, PA-C  docusate (COLACE) 50 MG/5ML liquid Place 10 mLs (100 mg total) into feeding tube 2 (two) times daily. 10/25/21   Gleason, Leita SAUNDERS, PA-C  docusate sodium  (COLACE) 100 MG capsule Take 1 capsule (100 mg total) by mouth 2 (two) times daily as needed for mild constipation. 10/25/21   Gleason, Leita SAUNDERS, PA-C  enoxaparin  (LOVENOX ) 40 MG/0.4ML injection Inject 0.4 mLs (40 mg total) into the skin daily. 10/26/21   Gleason, Leita SAUNDERS, PA-C  furosemide  (LASIX ) 10 MG/ML injection Inject 4 mLs (40 mg total) into the vein daily. 10/25/21   Gleason, Leita SAUNDERS, PA-C  haloperidol  lactate (HALDOL ) 5 MG/ML injection Inject 1 mL (5 mg total) into the vein every 6 (six) hours as needed. 10/25/21   Gleason, Leita SAUNDERS, PA-C  insulin  aspart (NOVOLOG ) 100 UNIT/ML injection Inject 2-6 Units into the skin every 4 (four) hours. 10/25/21   Gleason, Leita SAUNDERS, PA-C  ipratropium (ATROVENT ) 0.02 % nebulizer solution Take 2.5 mLs (0.5 mg total) by nebulization every 2 (two) hours as needed for wheezing. 10/25/21   Gleason, Leita SAUNDERS, PA-C  lip balm (CARMEX) ointment Apply topically as needed for lip care. 10/25/21    Gleason, Leita SAUNDERS, PA-C  liver oil-zinc  oxide (DESITIN) 40 % ointment Apply topically 2 (two) times daily. Patient not taking: Reported on 01/11/2024 10/25/21   Gleason, Leita SAUNDERS, PA-C  Multiple Vitamin (MULTIVITAMIN WITH MINERALS) TABS tablet Take 1 tablet by mouth daily.    [provider]  Multiple Vitamins-Minerals (PRESERVISION AREDS) TABS Take 1 tablet by mouth 2 (two) times daily.    [provider]  Nutritional Supplements (FEEDING SUPPLEMENT, OSMOLITE 1.2 CAL,) LIQD Place 1,000 mLs into feeding tube continuous. 10/25/21   Gleason, Leita SAUNDERS, PA-C  Nutritional Supplements (FEEDING SUPPLEMENT, PROSOURCE TF,) liquid Place 90 mLs into feeding tube 2 (two) times daily. 10/25/21   Gleason, Leita SAUNDERS, PA-C  ondansetron  (ZOFRAN ) 4 MG/2ML SOLN injection Inject 2 mLs (4 mg total) into the vein every 6 (six) hours as needed  for nausea. 10/25/21   Gleason, Leita SAUNDERS, PA-C  oxyCODONE  (ROXICODONE ) 5 MG/5ML solution Place 5 mLs (5 mg total) into feeding tube every 6 (six) hours. 10/25/21   Gleason, Leita SAUNDERS, PA-C  pantoprazole  sodium (PROTONIX ) 40 mg Place 40 mg into feeding tube at bedtime. 10/25/21   Gleason, Leita SAUNDERS, PA-C  polyethylene glycol (MIRALAX  / GLYCOLAX ) 17 g packet Place 17 g into feeding tube daily. 10/26/21   Gleason, Leita SAUNDERS, PA-C  potassium chloride  (KLOR-CON ) 20 MEQ packet Place 40 mEq into feeding tube daily. 10/26/21   Gleason, Leita SAUNDERS, PA-C    Physical Exam: Vitals:   01/11/24 1600 01/11/24 1601 01/11/24 1845 01/11/24 1951  BP:  132/73 115/63   Pulse:  (!) 118 (!) 108   Resp:  20 (!) 24   Temp:  99.6 F (37.6 C)  98.5 F (36.9 C)  TempSrc:  Oral  Oral  SpO2:  93% 93%   Weight: 72.6 kg     Height: 5' 6 (1.676 m)      Constitutional: Acutely ill looking, no distress, NAD, calm, comfortable Eyes: PERRL, lids and conjunctivae normal ENMT: Mucous membranes are moist. Posterior pharynx clear of any exudate or lesions.Normal dentition.  Neck: normal, supple, no masses, no  thyromegaly Respiratory: Decreased air entry bilaterally with some expiratory wheezing, no crackles. Normal respiratory effort. No accessory muscle use.  Cardiovascular: Sinus tachycardia, no murmurs / rubs / gallops. No extremity edema. 2+ pedal pulses. No carotid bruits.  Abdomen: no tenderness, no masses palpated. No hepatosplenomegaly. Bowel sounds positive.  Musculoskeletal: Good range of motion, no joint swelling or tenderness, Skin: no rashes, lesions, ulcers. No induration Neurologic: CN 2-12 grossly intact. Sensation intact, DTR normal. Strength 5/5 in all 4.  Psychiatric: Normal judgment and insight. Alert and oriented x 3. Normal mood  Data Reviewed:  Afebrile with blood pressure 130/73, heart rate is 118, venous pH 7.442, sodium 132, potassium 3.4 chloride 94, creatinine 1.03, calcium 8.8.  White count 10.8.  Chest x-ray shows scattered interstitial opacities in the lung bases CT angiogram of the chest showed no PE but resolution of bilateral airspace consolidation with new patchy groundglass and tree-in-bud opacities throughout both lungs compatible with multifocal pneumonia  Assessment and Plan:  #1 acute hypoxic respiratory failure: Secondary to multifocal pneumonia and COPD exacerbation.  Patient not on home oxygen but not requiring 2 to 3 L of oxygen.  Patient will be admitted.  Aggressive treatment with IV Rocephin and Zithromax .  Viral screening.  Treat for COPD exacerbation as well.  Cough medications.  #2 COPD with acute exacerbation: Continue steroids, antibiotics with breathing treatment  #3 hyperlipidemia: Confirm on resume home regimen  #4 essential hypertension: Confirm on resume home regimen  #5 depression with anxiety: Confirm on resume home regimen.  #6 osteoarthritis: Multiple joints.  Appears stable at this point  #7 remote history of PE: Not on anticoagulation.  DVT prophylaxis    Advance Care Planning:   Code Status: Full Code   Consults:  None  Family Communication: No family at bedside  Severity of Illness: The appropriate patient status for this patient is INPATIENT. Inpatient status is judged to be reasonable and necessary in order to provide the required intensity of service to ensure the patient's safety. The patient's presenting symptoms, physical exam findings, and initial radiographic and laboratory data in the context of their chronic comorbidities is felt to place them at high risk for further clinical deterioration. Furthermore, it is not anticipated that the patient  will be medically stable for discharge from the hospital within 2 midnights of admission.   * I certify that at the point of admission it is my clinical judgment that the patient will require inpatient hospital care spanning beyond 2 midnights from the point of admission due to high intensity of service, high risk for further deterioration and high frequency of surveillance required.*  AuthorBETHA SIM KNOLL, MD 01/11/2024 9:52 PM  For on call review [email or web address].

## 2024-01-11 NOTE — ED Notes (Signed)
 Attempted pt on room air. Sats droped to 89% and down to 87% while talking. Placed on 4L [place] and up to 92%.

## 2024-01-11 NOTE — ED Notes (Signed)
 RT at bedside.

## 2024-01-12 DIAGNOSIS — J9621 Acute and chronic respiratory failure with hypoxia: Secondary | ICD-10-CM | POA: Diagnosis not present

## 2024-01-12 DIAGNOSIS — J189 Pneumonia, unspecified organism: Secondary | ICD-10-CM | POA: Diagnosis not present

## 2024-01-12 DIAGNOSIS — F419 Anxiety disorder, unspecified: Secondary | ICD-10-CM | POA: Diagnosis not present

## 2024-01-12 DIAGNOSIS — J449 Chronic obstructive pulmonary disease, unspecified: Secondary | ICD-10-CM

## 2024-01-12 DIAGNOSIS — I10 Essential (primary) hypertension: Secondary | ICD-10-CM

## 2024-01-12 LAB — COMPREHENSIVE METABOLIC PANEL WITH GFR
ALT: 29 U/L (ref 0–44)
AST: 32 U/L (ref 15–41)
Albumin: 2.5 g/dL — ABNORMAL LOW (ref 3.5–5.0)
Alkaline Phosphatase: 83 U/L (ref 38–126)
Anion gap: 13 (ref 5–15)
BUN: 16 mg/dL (ref 8–23)
CO2: 24 mmol/L (ref 22–32)
Calcium: 8.8 mg/dL — ABNORMAL LOW (ref 8.9–10.3)
Chloride: 99 mmol/L (ref 98–111)
Creatinine, Ser: 0.92 mg/dL (ref 0.44–1.00)
GFR, Estimated: >60 mL/min (ref >60)
Glucose, Bld: 150 mg/dL — ABNORMAL HIGH (ref 70–99)
Potassium: 3.1 mmol/L — ABNORMAL LOW (ref 3.5–5.1)
Sodium: 136 mmol/L (ref 135–145)
Total Bilirubin: 0.4 mg/dL (ref 0.0–1.2)
Total Protein: 6.3 g/dL — ABNORMAL LOW (ref 6.5–8.1)

## 2024-01-12 LAB — CBG MONITORING, ED
Glucose-Capillary: 144 mg/dL — ABNORMAL HIGH (ref 70–99)
Glucose-Capillary: 132 mg/dL — ABNORMAL HIGH (ref 70–99)

## 2024-01-12 LAB — CBC
HCT: 36.5 % (ref 36.0–46.0)
Hemoglobin: 11.8 g/dL — ABNORMAL LOW (ref 12.0–15.0)
MCH: 31.5 pg (ref 26.0–34.0)
MCHC: 32.3 g/dL (ref 30.0–36.0)
MCV: 97.3 fL (ref 80.0–100.0)
Platelets: 188 10*3/uL (ref 150–400)
RBC: 3.75 MIL/uL — ABNORMAL LOW (ref 3.87–5.11)
RDW: 12.8 % (ref 11.5–15.5)
WBC: 8.6 10*3/uL (ref 4.0–10.5)
nRBC: 0 % (ref 0.0–0.2)

## 2024-01-12 LAB — RESPIRATORY PANEL BY PCR

## 2024-01-12 LAB — HEMOGLOBIN A1C
Hgb A1c MFr Bld: 4.3 % — ABNORMAL LOW (ref 4.8–5.6)
Mean Plasma Glucose: 76.71 mg/dL

## 2024-01-12 LAB — RESP PANEL BY RT-PCR (RSV, FLU A&B, COVID)  RVPGX2
Influenza A by PCR: NEGATIVE
Influenza B by PCR: NEGATIVE
Resp Syncytial Virus by PCR: NEGATIVE
SARS Coronavirus 2 by RT PCR: NEGATIVE

## 2024-01-12 LAB — GLUCOSE, CAPILLARY
Glucose-Capillary: 114 mg/dL — ABNORMAL HIGH (ref 70–99)
Glucose-Capillary: 130 mg/dL — ABNORMAL HIGH (ref 70–99)
Glucose-Capillary: 115 mg/dL — ABNORMAL HIGH (ref 70–99)

## 2024-01-12 LAB — HIV ANTIBODY (ROUTINE TESTING W REFLEX): HIV Screen 4th Generation wRfx: NONREACTIVE

## 2024-01-12 MED ORDER — SODIUM CHLORIDE 0.9 % IV SOLN
500.0000 mg | INTRAVENOUS | Status: DC
  Administered 2024-01-12: 500 mg via INTRAVENOUS
  Filled 2024-01-12: qty 5

## 2024-01-12 MED ORDER — INSULIN ASPART 100 UNIT/ML IJ SOLN: 0.0000 [IU] | Freq: Every day | INTRAMUSCULAR | Status: DC

## 2024-01-12 MED ORDER — ORAL CARE MOUTH RINSE: 15.0000 mL | OROMUCOSAL | Status: DC | PRN

## 2024-01-12 MED ORDER — INSULIN ASPART 100 UNIT/ML IJ SOLN
0.0000 [IU] | Freq: Three times a day (TID) | INTRAMUSCULAR | Status: DC
  Administered 2024-01-12: 2 [IU] via SUBCUTANEOUS

## 2024-01-12 MED ORDER — ENOXAPARIN SODIUM 40 MG/0.4ML IJ SOSY
40.0000 mg | PREFILLED_SYRINGE | INTRAMUSCULAR | Status: DC
  Administered 2024-01-12 – 2024-01-14 (×3): 40 mg via SUBCUTANEOUS
  Filled 2024-01-12 (×4): qty 0.4

## 2024-01-12 MED ORDER — GUAIFENESIN-DM 100-10 MG/5ML PO SYRP
5.0000 mL | ORAL_SOLUTION | ORAL | Status: DC | PRN
  Administered 2024-01-13 – 2024-01-14 (×4): 5 mL via ORAL
  Filled 2024-01-12 (×4): qty 5

## 2024-01-12 MED ORDER — SODIUM CHLORIDE 0.9 % IV SOLN: INTRAVENOUS | Status: DC

## 2024-01-12 MED ORDER — SODIUM CHLORIDE 0.9 % IV SOLN
2.0000 g | INTRAVENOUS | Status: DC
  Administered 2024-01-12 – 2024-01-14 (×3): 2 g via INTRAVENOUS
  Filled 2024-01-12 (×3): qty 20

## 2024-01-12 NOTE — ED Notes (Signed)
 Report given patient transferred to room 38

## 2024-01-12 NOTE — Plan of Care (Signed)
  Problem: Education: Goal: Ability to describe self-care measures that may prevent or decrease complications (Diabetes Survival Skills Education) will improve Outcome: Progressing Goal: Individualized Educational Video(s) Outcome: Progressing   Problem: Coping: Goal: Ability to adjust to condition or change in health will improve Outcome: Progressing   Problem: Fluid Volume: Goal: Ability to maintain a balanced intake and output will improve Outcome: Progressing   Problem: Health Behavior/Discharge Planning: Goal: Ability to identify and utilize available resources and services will improve Outcome: Progressing Goal: Ability to manage health-related needs will improve Outcome: Progressing   Problem: Metabolic: Goal: Ability to maintain appropriate glucose levels will improve Outcome: Progressing   Problem: Nutritional: Goal: Maintenance of adequate nutrition will improve Outcome: Progressing Goal: Progress toward achieving an optimal weight will improve Outcome: Progressing   Problem: Skin Integrity: Goal: Risk for impaired skin integrity will decrease Outcome: Progressing   Problem: Tissue Perfusion: Goal: Adequacy of tissue perfusion will improve Outcome: Progressing   Problem: Activity: Goal: Ability to tolerate increased activity will improve Outcome: Progressing   Problem: Clinical Measurements: Goal: Ability to maintain a body temperature in the normal range will improve Outcome: Progressing   Problem: Respiratory: Goal: Ability to maintain adequate ventilation will improve Outcome: Progressing Goal: Ability to maintain a clear airway will improve Outcome: Progressing

## 2024-01-12 NOTE — Progress Notes (Signed)
 NEW ADMISSION NOTE New Admission Note:   Arrival Method: walked with steady gait Mental Orientation: alert x 4 Telemetry: NSR Assessment: Completed Skin: intact except scratches IV: two in RUE Pain: denies Tubes: none Safety Measures: Safety Fall Prevention Plan has been given, discussed and signed Admission: Completed [facility] Orientation: Patient has been orientated to the room, unit and staff.  Family: none at present  Orders have been reviewed and implemented. Will continue to monitor the patient. Call light has been placed within reach and bed alarm has been activated.   Ilynn Stauffer A Proctor-Gann, RN

## 2024-01-12 NOTE — ED Notes (Signed)
 Pt states that she is not a diabetic and she isn't sure why we are taking her blood sugar or giving her insulin .

## 2024-01-12 NOTE — Progress Notes (Signed)
 Progress Note   Patient: Gina Petty FMW:[id number] DOB: 03-25-1946 DOA: 01/11/2024  DOS: the patient was seen and examined on 01/12/2024   Brief hospital course:  78 y.o. female with medical history significant of COPD, previous pulmonary embolism, essential hypertension, depression, osteoarthritis, history of previous tracheostomy and gastrostomy tube, who was at her PCPs office today with complaint of shortness of breath cough and wheezing.  This been going on gradually since last week.  Patient is not on home O2.  She was evaluated and found to have low oxygen at the PCPs office.  She was also wheezing.  Patient was given some DuoNebs, Solu-Medrol  and magnesium  IV.  EMS brought patient to the ER with shortness of breath.  In the ER patient has been evaluated.  CT shows multifocal pneumonia.  Patient being admitted for management of acute hypoxic respiratory failure secondary to multifocal pneumonia and possibly COPD exacerbation.  Patient continues to cough.  No hemoptysis.  The cough is productive of white sputum.   Assessment and Plan:  Acute hypoxic respiratory failure - Initially requiring 2-3 L nasal cannula to maintain O2 sats greater than 90%.  Has been weaned down to 1.5 L this morning.  Showing improvement.  Will continue to wean O2 as tolerated.  Acute COPD exacerbation - Hypoxia, wheezing, cough on presentation.  Responding well to IV Solu-Medrol , scheduled nebulizers.  Given an increased sputum production and cough, will continue with empiric antibiotics.  Multifocal pneumonia - Noted bilateral tree-in-bud opacities in CT scan.  Sputum culture, strep pneumo/Legionella urine antigens pending.  Will order respiratory panel.  Continues on empiric ceftriaxone  2 g every 24 hours plus azithromycin  500 mg every 24 hours.  History of PE - No longer on anticoagulation.  No PE noted on CT angio.  Hypertension/hyperlipidemia - Resume home regimen.  Depression/anxiety - Will get him on  board.  Subjective: Patient resting comfortably this morning.  States she feels a bit better.  Still complaining of dry cough and spitting up some clearish yellowish sputum.  Denies any fever, green sputum, chest pain, nausea, vomiting, abdominal pain.  Tolerating antibiotics prior to this.  Currently on 1-2 L nasal cannula.  Physical Exam:  Vitals:   01/12/24 0603 01/12/24 0745 01/12/24 0930 01/12/24 1009  BP:  (!) 113/58 109/63   Pulse:  66 64   Resp:  (!) 23 (!) 22   Temp: 98.3 F (36.8 C)   98 F (36.7 C)  TempSrc:    Oral  SpO2:  97% 97%   Weight:      Height:        GENERAL:  Alert, pleasant, no acute distress  HEENT:  EOMI, nasal cannula CARDIOVASCULAR:  RRR, no murmurs appreciated RESPIRATORY: Poor air movement bilaterally, no wheezing appreciated GASTROINTESTINAL:  Soft, nontender, nondistended EXTREMITIES:  No LE edema bilaterally NEURO:  No new focal deficits appreciated SKIN:  No rashes noted PSYCH:  Appropriate mood and affect     Data Reviewed:  No new imaging to review  Previous records (including but not limited to H&P, progress notes, nursing notes, TOC management) were reviewed in assessment of this patient.  Labs: CBC: Recent Labs  Lab 01/11/24 1617 01/11/24 1649 01/12/24 0629  WBC 10.8*  --  8.6  HGB 13.5 13.3 11.8*  HCT 41.1 39.0 36.5  MCV 96.5  --  97.3  PLT 217  --  188   Basic Metabolic Panel: Recent Labs  Lab 01/11/24 1617 01/11/24 1649 01/12/24 0629  NA 135 132*  136  K 3.4* 3.5 3.1*  CL 94*  --  99  CO2 23  --  24  GLUCOSE 136*  --  150*  BUN 16  --  16  CREATININE 1.03*  --  0.92  CALCIUM 8.8*  --  8.8*   Liver Function Tests: Recent Labs  Lab 01/12/24 0629  AST 32  ALT 29  ALKPHOS 83  BILITOT 0.4  PROT 6.3*  ALBUMIN  2.5*   CBG: Recent Labs  Lab 01/12/24 0747  GLUCAP 144*    Scheduled Meds:  enoxaparin  (LOVENOX ) injection  40 mg Subcutaneous Q24H   insulin  aspart  0-15 Units Subcutaneous TID WC   insulin   aspart  0-5 Units Subcutaneous QHS   Continuous Infusions:  azithromycin      cefTRIAXone  (ROCEPHIN )  IV     PRN Meds:.  Family Communication: None at bedside  Disposition: Status is: Inpatient Remains inpatient appropriate because: Acute hypoxic respiratory failure, COPD exacerbation     Time spent: 39 minutes  Length of inpatient stay: 1 days  Author: Carliss LELON Canales, DO 01/12/2024 11:39 AM  For on call review [email or web address].

## 2024-01-12 NOTE — Hospital Course (Signed)
 78 y.o. female with medical history significant of COPD, previous pulmonary embolism, essential hypertension, depression, osteoarthritis, history of previous tracheostomy and gastrostomy tube, who was at her PCPs office today with complaint of shortness of breath cough and wheezing.  This been going on gradually since last week.  Patient is not on home O2.  She was evaluated and found to have low oxygen at the PCPs office.  She was also wheezing.  Patient was given some DuoNebs, Solu-Medrol  and magnesium  IV.  EMS brought patient to the ER with shortness of breath.  In the ER patient has been evaluated.  CT shows multifocal pneumonia.  Patient being admitted for management of acute hypoxic respiratory failure secondary to multifocal pneumonia and possibly COPD exacerbation.  Patient continues to cough.  No hemoptysis.  The cough is productive of white sputum.    Assessment and Plan:   Acute hypoxic respiratory failure - Initially requiring 2-3 L nasal cannula to maintain O2 sats greater than 90%.  Has been weaned down to 1.5 L this morning.  Showing improvement.  Will continue to wean O2 as tolerated.   Acute COPD exacerbation - Hypoxia, wheezing, cough on presentation.  Responding well to IV Solu-Medrol , scheduled nebulizers.  Given an increased sputum production and cough, will continue with empiric antibiotics.   Multifocal pneumonia - Noted bilateral tree-in-bud opacities in CT scan.  Sputum culture, strep pneumo/Legionella urine antigens pending.  Will order respiratory panel.  Continues on empiric ceftriaxone  2 g every 24 hours plus azithromycin  500 mg every 24 hours.   History of PE - No longer on anticoagulation.  No PE noted on CT angio.   Hypertension/hyperlipidemia - Resume home regimen.   Depression/anxiety - Will get him on board.

## 2024-01-12 NOTE — ED Notes (Signed)
Pt CBG 144

## 2024-01-13 DIAGNOSIS — J449 Chronic obstructive pulmonary disease, unspecified: Secondary | ICD-10-CM | POA: Diagnosis not present

## 2024-01-13 DIAGNOSIS — J9621 Acute and chronic respiratory failure with hypoxia: Secondary | ICD-10-CM | POA: Diagnosis not present

## 2024-01-13 DIAGNOSIS — E876 Hypokalemia: Secondary | ICD-10-CM

## 2024-01-13 DIAGNOSIS — J189 Pneumonia, unspecified organism: Secondary | ICD-10-CM | POA: Diagnosis not present

## 2024-01-13 DIAGNOSIS — I10 Essential (primary) hypertension: Secondary | ICD-10-CM | POA: Diagnosis not present

## 2024-01-13 LAB — BASIC METABOLIC PANEL WITH GFR
Anion gap: 11 (ref 5–15)
BUN: 22 mg/dL (ref 8–23)
CO2: 24 mmol/L (ref 22–32)
Calcium: 9 mg/dL (ref 8.9–10.3)
Chloride: 106 mmol/L (ref 98–111)
Creatinine, Ser: 0.95 mg/dL (ref 0.44–1.00)
GFR, Estimated: >60 mL/min (ref >60)
Glucose, Bld: 86 mg/dL (ref 70–99)
Potassium: 3.4 mmol/L — ABNORMAL LOW (ref 3.5–5.1)
Sodium: 141 mmol/L (ref 135–145)

## 2024-01-13 LAB — GLUCOSE, CAPILLARY
Glucose-Capillary: 93 mg/dL (ref 70–99)
Glucose-Capillary: 103 mg/dL — ABNORMAL HIGH (ref 70–99)
Glucose-Capillary: 184 mg/dL — ABNORMAL HIGH (ref 70–99)
Glucose-Capillary: 145 mg/dL — ABNORMAL HIGH (ref 70–99)

## 2024-01-13 LAB — CBC
HCT: 39.2 % (ref 36.0–46.0)
Hemoglobin: 12.5 g/dL (ref 12.0–15.0)
MCH: 31.6 pg (ref 26.0–34.0)
MCHC: 31.9 g/dL (ref 30.0–36.0)
MCV: 99.2 fL (ref 80.0–100.0)
Platelets: 247 10*3/uL (ref 150–400)
RBC: 3.95 MIL/uL (ref 3.87–5.11)
RDW: 12.6 % (ref 11.5–15.5)
WBC: 13.4 10*3/uL — ABNORMAL HIGH (ref 4.0–10.5)
nRBC: 0 % (ref 0.0–0.2)

## 2024-01-13 LAB — STREP PNEUMONIAE URINARY ANTIGEN: Strep Pneumo Urinary Antigen: NEGATIVE

## 2024-01-13 MED ORDER — VALSARTAN-HYDROCHLOROTHIAZIDE 160-25 MG PO TABS: 1.0000 | ORAL_TABLET | Freq: Every day | ORAL | Status: DC

## 2024-01-13 MED ORDER — DORZOLAMIDE HCL 2 % OP SOLN
1.0000 [drp] | Freq: Two times a day (BID) | OPHTHALMIC | Status: DC
  Administered 2024-01-13 – 2024-01-15 (×4): 1 [drp] via OPHTHALMIC
  Filled 2024-01-13: qty 10

## 2024-01-13 MED ORDER — DULOXETINE HCL 30 MG PO CPEP
30.0000 mg | ORAL_CAPSULE | Freq: Every day | ORAL | Status: DC
  Administered 2024-01-14 – 2024-01-15 (×2): 30 mg via ORAL
  Filled 2024-01-13 (×2): qty 1

## 2024-01-13 MED ORDER — BUSPIRONE HCL 5 MG PO TABS
7.5000 mg | ORAL_TABLET | Freq: Every day | ORAL | Status: DC
  Administered 2024-01-13 – 2024-01-14 (×2): 7.5 mg via ORAL
  Filled 2024-01-13 (×2): qty 2

## 2024-01-13 MED ORDER — IRBESARTAN 75 MG PO TABS
150.0000 mg | ORAL_TABLET | Freq: Every day | ORAL | Status: DC
  Administered 2024-01-14 – 2024-01-15 (×2): 150 mg via ORAL
  Filled 2024-01-13 (×2): qty 2

## 2024-01-13 MED ORDER — AZITHROMYCIN 500 MG PO TABS
500.0000 mg | ORAL_TABLET | Freq: Every day | ORAL | Status: DC
  Administered 2024-01-13 – 2024-01-14 (×2): 500 mg via ORAL
  Filled 2024-01-13 (×2): qty 1

## 2024-01-13 MED ORDER — HYDROCHLOROTHIAZIDE 25 MG PO TABS: 25.0000 mg | ORAL_TABLET | Freq: Every day | ORAL | Status: DC

## 2024-01-13 MED ORDER — METHYLPREDNISOLONE SODIUM SUCC 125 MG IJ SOLR
80.0000 mg | Freq: Every day | INTRAMUSCULAR | Status: DC
  Administered 2024-01-13 – 2024-01-15 (×3): 80 mg via INTRAVENOUS
  Filled 2024-01-13 (×3): qty 2

## 2024-01-13 MED ORDER — BUPROPION HCL ER (XL) 150 MG PO TB24
150.0000 mg | ORAL_TABLET | Freq: Every day | ORAL | Status: DC
  Administered 2024-01-14 – 2024-01-15 (×2): 150 mg via ORAL
  Filled 2024-01-13 (×2): qty 1

## 2024-01-13 MED ORDER — IRBESARTAN 75 MG PO TABS: 150.0000 mg | ORAL_TABLET | Freq: Every day | ORAL | Status: DC

## 2024-01-13 MED ORDER — HYDROCHLOROTHIAZIDE 25 MG PO TABS
25.0000 mg | ORAL_TABLET | Freq: Every day | ORAL | Status: DC
  Administered 2024-01-14 – 2024-01-15 (×2): 25 mg via ORAL
  Filled 2024-01-13 (×2): qty 1

## 2024-01-13 MED ORDER — IPRATROPIUM-ALBUTEROL 0.5-2.5 (3) MG/3ML IN SOLN
3.0000 mL | Freq: Four times a day (QID) | RESPIRATORY_TRACT | Status: DC
  Administered 2024-01-13 – 2024-01-14 (×3): 3 mL via RESPIRATORY_TRACT
  Filled 2024-01-13 (×3): qty 3

## 2024-01-13 MED ORDER — BENZONATATE 100 MG PO CAPS
100.0000 mg | ORAL_CAPSULE | Freq: Two times a day (BID) | ORAL | Status: DC
  Administered 2024-01-13 – 2024-01-15 (×5): 100 mg via ORAL
  Filled 2024-01-13 (×5): qty 1

## 2024-01-13 NOTE — Progress Notes (Signed)
 PHARMACIST - PHYSICIAN COMMUNICATION DR: Arlon CONCERNING: Antibiotic IV to Oral Route Change Policy  RECOMMENDATION: This patient is receiving azithromycin  by the intravenous route.  Based on criteria approved by the Pharmacy and Therapeutics Committee, the antibiotic(s) is/are being converted to the equivalent oral dose form(s).   DESCRIPTION: These criteria include: Patient being treated for a respiratory tract infection, urinary tract infection, cellulitis or clostridium difficile associated diarrhea if on metronidazole  The patient is not neutropenic and does not exhibit a GI malabsorption state The patient is eating (either orally or via tube) and/or has been taking other orally administered medications for a least 24 hours The patient is improving clinically and has a Tmax < 100.5  If you have questions about this conversion, please contact the Pharmacy Department  []   [phone] )  Gina Petty []   [phone] )  [facility] [x]   [phone] )  [facility] []   [phone] )  [facility] []   [phone] )  [facility]

## 2024-01-13 NOTE — Progress Notes (Signed)
 Progress Note   Patient: Gina Petty FMW:[id number] DOB: 12/09/1945 DOA: 01/11/2024  DOS: the patient was seen and examined on 01/13/2024   Brief hospital course:  78 y.o. female with medical history significant of COPD, previous pulmonary embolism, essential hypertension, depression, osteoarthritis, history of previous tracheostomy and gastrostomy tube, who was at her PCPs office today with complaint of shortness of breath cough and wheezing.  This been going on gradually since last week.  Patient is not on home O2.  She was evaluated and found to have low oxygen at the PCPs office.  She was also wheezing.  Patient was given some DuoNebs, Solu-Medrol  and magnesium  IV.  EMS brought patient to the ER with shortness of breath.  In the ER patient has been evaluated.  CT shows multifocal pneumonia.  Patient being admitted for management of acute hypoxic respiratory failure secondary to multifocal pneumonia and possibly COPD exacerbation.  Patient continues to cough.  No hemoptysis.  The cough is productive of white sputum.    Assessment and Plan:   Acute hypoxic respiratory failure - Initially requiring 2-3 L nasal cannula to maintain O2 sats greater than 90%.  Has been weaned down to 1-2 L this morning.  Still having some dyspnea on exertion.  Showing improvement.  Will continue to wean O2 as tolerated.   Acute COPD exacerbation - Hypoxia, wheezing, cough on presentation.  Responding well to IV Solu-Medrol , scheduled nebulizers.  Given an increased sputum production and cough, will continue with empiric antibiotics.  Will convert antibiotics to p.o.  Guaifenesin, Tessalon Perles added.   Multifocal pneumonia - Noted bilateral tree-in-bud opacities in CT scan.  Sputum culture, Legionella urine antigens pending.  Strep pneumo urine antigen negative.  COVID/flu/RSV negative.  Rapid pathogen profile negative.  Continues on empiric ceftriaxone  2 g every 24 hours plus azithromycin  500 mg every 24 hours.    History of PE - No longer on anticoagulation.  No PE noted on CT angio.   Hypertension/hyperlipidemia - Resume home regimen.   Depression/anxiety - Will get him on board.   Subjective: Patient resting comfortably this morning.  Complaining of hacking dry cough.  Still having some dyspnea on exertion does not quite feel as well as she  does at her baseline.  Is motivated to be discharged but is not quite ready.  Continues on 2 L nasal cannula.  Had frank shortness of breath when trying to ambulate to the bathroom off of nasal cannula.  Denies any fever, purulent sputum, chest pain, nausea, vomiting, abdominal pain.  Physical Exam:  Vitals:   01/12/24 1645 01/12/24 2052 01/13/24 0520 01/13/24 0726  BP: 111/61 124/70 128/72 137/67  Pulse: 92 91 76 91  Resp: 18 18 19    Temp: 98.3 F (36.8 C) 97.8 F (36.6 C) (!) 97.3 F (36.3 C) 98.4 F (36.9 C)  TempSrc:  Oral Oral Oral  SpO2: 93% 98% 97% 95%  Weight:      Height:        GENERAL:  Alert, pleasant, no acute distress  HEENT:  EOMI, nasal cannula CARDIOVASCULAR:  RRR, no murmurs appreciated RESPIRATORY: Dry cough, poor air movement bilaterally, no wheezing appreciated GASTROINTESTINAL:  Soft, nontender, nondistended EXTREMITIES:  No LE edema bilaterally NEURO:  No new focal deficits appreciated SKIN:  No rashes noted PSYCH:  Appropriate mood and affect     Data Reviewed:  No new imaging to review  Previous records (including but not limited to H&P, progress notes, nursing notes, TOC management) were reviewed  in assessment of this patient.  Labs: CBC: Recent Labs  Lab 01/11/24 1617 01/11/24 1649 01/12/24 0629 01/13/24 0717  WBC 10.8*  --  8.6 13.4*  HGB 13.5 13.3 11.8* 12.5  HCT 41.1 39.0 36.5 39.2  MCV 96.5  --  97.3 99.2  PLT 217  --  188 247   Basic Metabolic Panel: Recent Labs  Lab 01/11/24 1617 01/11/24 1649 01/12/24 0629 01/13/24 0717  NA 135 132* 136 141  K 3.4* 3.5 3.1* 3.4*  CL 94*  --  99  106  CO2 23  --  24 24  GLUCOSE 136*  --  150* 86  BUN 16  --  16 22  CREATININE 1.03*  --  0.92 0.95  CALCIUM 8.8*  --  8.8* 9.0   Liver Function Tests: Recent Labs  Lab 01/12/24 0629  AST 32  ALT 29  ALKPHOS 83  BILITOT 0.4  PROT 6.3*  ALBUMIN  2.5*   CBG: Recent Labs  Lab 01/12/24 1514 01/12/24 1647 01/12/24 2054 01/13/24 0729 01/13/24 1148  GLUCAP 114* 130* 115* 93 103*    Scheduled Meds:  azithromycin   500 mg Oral Q supper   benzonatate  100 mg Oral BID   enoxaparin  (LOVENOX ) injection  40 mg Subcutaneous Q24H   ipratropium-albuterol   3 mL Nebulization Q6H   methylPREDNISolone  (SOLU-MEDROL ) injection  80 mg Intravenous Daily   Continuous Infusions:  cefTRIAXone  (ROCEPHIN )  IV Stopped (01/12/24 1812)   PRN Meds:.guaiFENesin-dextromethorphan, mouth rinse  Family Communication: None at bedside  Disposition: Status is: Inpatient Remains inpatient appropriate because: COPD exacerbation/hypoxic respiratory failure     Time spent: 40 minutes  Length of inpatient stay: 2 days  Author: Carliss LELON Canales, DO 01/13/2024 1:36 PM  For on call review [email or web address].

## 2024-01-13 NOTE — Progress Notes (Signed)
 Called patient's daughter, Chenae Brager, with an update.

## 2024-01-13 NOTE — Plan of Care (Signed)
  Problem: Coping: Goal: Ability to adjust to condition or change in health will improve Outcome: Progressing   Problem: Activity: Goal: Ability to tolerate increased activity will improve Outcome: Progressing   Problem: Clinical Measurements: Goal: Ability to maintain a body temperature in the normal range will improve Outcome: Progressing   Problem: Health Behavior/Discharge Planning: Goal: Ability to manage health-related needs will improve Outcome: Progressing   Problem: Clinical Measurements: Goal: Ability to maintain clinical measurements within normal limits will improve Outcome: Progressing

## 2024-01-13 NOTE — Progress Notes (Signed)
 Notified MD Arlon because patient continues to cough even after I have administered the meds on her MAR.

## 2024-01-14 DIAGNOSIS — J449 Chronic obstructive pulmonary disease, unspecified: Secondary | ICD-10-CM | POA: Diagnosis not present

## 2024-01-14 DIAGNOSIS — J189 Pneumonia, unspecified organism: Secondary | ICD-10-CM | POA: Diagnosis not present

## 2024-01-14 DIAGNOSIS — J9621 Acute and chronic respiratory failure with hypoxia: Secondary | ICD-10-CM | POA: Diagnosis not present

## 2024-01-14 LAB — CBC
HCT: 35.6 % — ABNORMAL LOW (ref 36.0–46.0)
Hemoglobin: 11.2 g/dL — ABNORMAL LOW (ref 12.0–15.0)
MCH: 31.1 pg (ref 26.0–34.0)
MCHC: 31.5 g/dL (ref 30.0–36.0)
MCV: 98.9 fL (ref 80.0–100.0)
Platelets: 248 10*3/uL (ref 150–400)
RBC: 3.6 MIL/uL — ABNORMAL LOW (ref 3.87–5.11)
RDW: 12.4 % (ref 11.5–15.5)
WBC: 8 10*3/uL (ref 4.0–10.5)
nRBC: 0 % (ref 0.0–0.2)

## 2024-01-14 LAB — BASIC METABOLIC PANEL WITH GFR
Anion gap: 12 (ref 5–15)
BUN: 18 mg/dL (ref 8–23)
CO2: 27 mmol/L (ref 22–32)
Calcium: 8.9 mg/dL (ref 8.9–10.3)
Chloride: 105 mmol/L (ref 98–111)
Creatinine, Ser: 0.95 mg/dL (ref 0.44–1.00)
GFR, Estimated: >60 mL/min (ref >60)
Glucose, Bld: 135 mg/dL — ABNORMAL HIGH (ref 70–99)
Potassium: 4.2 mmol/L (ref 3.5–5.1)
Sodium: 144 mmol/L (ref 135–145)

## 2024-01-14 LAB — MAGNESIUM: Magnesium: 2.2 mg/dL (ref 1.7–2.4)

## 2024-01-14 MED ORDER — ALBUTEROL SULFATE (2.5 MG/3ML) 0.083% IN NEBU
2.5000 mg | INHALATION_SOLUTION | Freq: Four times a day (QID) | RESPIRATORY_TRACT | Status: DC
  Administered 2024-01-14 – 2024-01-15 (×5): 2.5 mg via RESPIRATORY_TRACT
  Filled 2024-01-14 (×6): qty 3

## 2024-01-14 NOTE — Plan of Care (Signed)
   Problem: Education: Goal: Ability to describe self-care measures that may prevent or decrease complications (Diabetes Survival Skills Education) will improve Outcome: Progressing Goal: Individualized Educational Video(s) Outcome: Progressing   Problem: Coping: Goal: Ability to adjust to condition or change in health will improve Outcome: Progressing   Problem: Fluid Volume: Goal: Ability to maintain a balanced intake and output will improve Outcome: Progressing   Problem: Health Behavior/Discharge Planning: Goal: Ability to identify and utilize available resources and services will improve Outcome: Progressing Goal: Ability to manage health-related needs will improve Outcome: Progressing   Problem: Metabolic: Goal: Ability to maintain appropriate glucose levels will improve Outcome: Progressing   Problem: Nutritional: Goal: Maintenance of adequate nutrition will improve Outcome: Progressing Goal: Progress toward achieving an optimal weight will improve Outcome: Progressing   Problem: Skin Integrity: Goal: Risk for impaired skin integrity will decrease Outcome: Progressing   Problem: Tissue Perfusion: Goal: Adequacy of tissue perfusion will improve Outcome: Progressing   Problem: Activity: Goal: Ability to tolerate increased activity will improve Outcome: Progressing   Problem: Clinical Measurements: Goal: Ability to maintain a body temperature in the normal range will improve Outcome: Progressing   Problem: Respiratory: Goal: Ability to maintain adequate ventilation will improve Outcome: Progressing Goal: Ability to maintain a clear airway will improve Outcome: Progressing   Problem: Education: Goal: Knowledge of General Education information will improve Description: Including pain rating scale, medication(s)/side effects and non-pharmacologic comfort measures Outcome: Progressing   Problem: Health Behavior/Discharge Planning: Goal: Ability to manage  health-related needs will improve Outcome: Progressing   Problem: Clinical Measurements: Goal: Ability to maintain clinical measurements within normal limits will improve Outcome: Progressing Goal: Will remain free from infection Outcome: Progressing Goal: Diagnostic test results will improve Outcome: Progressing Goal: Respiratory complications will improve Outcome: Progressing Goal: Cardiovascular complication will be avoided Outcome: Progressing   Problem: Activity: Goal: Risk for activity intolerance will decrease Outcome: Progressing   Problem: Nutrition: Goal: Adequate nutrition will be maintained Outcome: Progressing   Problem: Coping: Goal: Level of anxiety will decrease Outcome: Progressing   Problem: Elimination: Goal: Will not experience complications related to bowel motility Outcome: Progressing Goal: Will not experience complications related to urinary retention Outcome: Progressing   Problem: Pain Managment: Goal: General experience of comfort will improve and/or be controlled Outcome: Progressing   Problem: Safety: Goal: Ability to remain free from injury will improve Outcome: Progressing   Problem: Skin Integrity: Goal: Risk for impaired skin integrity will decrease Outcome: Progressing

## 2024-01-14 NOTE — TOC Progression Note (Signed)
 Transition of Care [facility]) - Progression Note    Patient Details  Name: Gina Petty MRN: [id number] Date of Birth: 1946-05-17  Transition of Care [facility]) CM/SW Contact  Robynn Eileen Hoose, RN Phone Number: 01/14/2024, 2:30 PM  Clinical Narrative:   Patient unable to tolerate ambulation sat test, discharge placed on hold at this time.         Expected Discharge Plan and Services                                               Social Determinants of Health (SDOH) Interventions SDOH Screenings   Food Insecurity: No Food Insecurity (01/12/2024)  Housing: High Risk (01/12/2024)  Transportation Needs: No Transportation Needs (01/12/2024)  Utilities: Not At Risk (01/12/2024)  Social Connections: Moderately Integrated (01/12/2024)  Tobacco Use: Medium Risk (01/11/2024)    Readmission Risk Interventions     No data to display

## 2024-01-14 NOTE — Progress Notes (Signed)
 Progress Note   Patient: Gina Petty FMW:[id number] DOB: Jun 08, 1946 DOA: 01/11/2024  DOS: the patient was seen and examined on 01/14/2024   Brief hospital course:  78 y.o. female with medical history significant of COPD, previous pulmonary embolism, essential hypertension, depression, osteoarthritis, history of previous tracheostomy and gastrostomy tube, who was at her PCPs office today with complaint of shortness of breath cough and wheezing.  This been going on gradually since last week.  Patient is not on home O2.  She was evaluated and found to have low oxygen at the PCPs office.  She was also wheezing.  Patient was given some DuoNebs, Solu-Medrol  and magnesium  IV.  EMS brought patient to the ER with shortness of breath.  In the ER patient has been evaluated.  CT shows multifocal pneumonia.  Patient being admitted for management of acute hypoxic respiratory failure secondary to multifocal pneumonia and possibly COPD exacerbation.  Patient continues to cough.  No hemoptysis.  The cough is productive of white sputum.    Assessment and Plan:   Acute hypoxic respiratory failure - Initially requiring 2-3 L nasal cannula to maintain O2 sats greater than 90%.  Has been weaned down to 1 L this morning.   Showing improvement.  Will continue to wean O2 as tolerated.  Possible given patient's longstanding COPD that she may need chronic home O2.  Will do walk test today for evaluation.   Acute COPD exacerbation - Hypoxia, wheezing, cough on presentation.  Responding well to IV Solu-Medrol , scheduled nebulizers.  Given an increased sputum production and cough, will continue with empiric antibiotics.  Guaifenesin, Tessalon Perles on board. Hopeful DC later today or tomorrow.   Multifocal pneumonia - Noted bilateral tree-in-bud opacities in CT scan.  Sputum culture, Legionella urine antigens pending.  Strep pneumo urine antigen negative.  COVID/flu/RSV negative.  Rapid pathogen profile negative.  Continues on  empiric ceftriaxone  2 g every 24 hours plus azithromycin  500 mg every 24 hours.     History of PE - No longer on anticoagulation.  No PE noted on CT angio.   Hypertension/hyperlipidemia - Resume home regimen.   Depression/anxiety - Will get him on board.   Subjective: Pt feeling improved this morning.  On 1L [place].  Still having intermittent dry cough.  Notes she doesn't have nebulizer nor inhalers at home.  Denies fever, SOB, n/v/d.    Physical Exam:  Vitals:   01/13/24 2052 01/13/24 2221 01/14/24 0520 01/14/24 0906  BP: (!) 147/71  129/67 (!) 140/73  Pulse: 84  80 78  Resp: 20   18  Temp: 97.8 F (36.6 C)  98.1 F (36.7 C) 97.6 F (36.4 C)  TempSrc:   Axillary Oral  SpO2: 98%  96% 90%  Weight:  75.8 kg    Height:        GENERAL:  Alert, pleasant, no acute distress  HEENT:  EOMI, nasal cannula CARDIOVASCULAR:  RRR, no murmurs appreciated RESPIRATORY: Dry cough, poor air movement bilaterally, no wheezing appreciated GASTROINTESTINAL:  Soft, nontender, nondistended EXTREMITIES:  No LE edema bilaterally NEURO:  No new focal deficits appreciated SKIN:  No rashes noted PSYCH:  Appropriate mood and affect    Data Reviewed:  No new imaging  Previous records (including but not limited to H&P, progress notes, nursing notes, TOC management) were reviewed in assessment of this patient.  Labs: CBC: Recent Labs  Lab 01/11/24 1617 01/11/24 1649 01/12/24 0629 01/13/24 0717 01/14/24 0823  WBC 10.8*  --  8.6 13.4* 8.0  HGB 13.5 13.3 11.8* 12.5 11.2*  HCT 41.1 39.0 36.5 39.2 35.6*  MCV 96.5  --  97.3 99.2 98.9  PLT 217  --  188 247 248   Basic Metabolic Panel: Recent Labs  Lab 01/11/24 1617 01/11/24 1649 01/12/24 0629 01/13/24 0717 01/14/24 0823  NA 135 132* 136 141 144  K 3.4* 3.5 3.1* 3.4* 4.2  CL 94*  --  99 106 105  CO2 23  --  24 24 27   GLUCOSE 136*  --  150* 86 135*  BUN 16  --  16 22 18   CREATININE 1.03*  --  0.92 0.95 0.95  CALCIUM 8.8*  --  8.8* 9.0  8.9  MG  --   --   --   --  2.2   Liver Function Tests: Recent Labs  Lab 01/12/24 0629  AST 32  ALT 29  ALKPHOS 83  BILITOT 0.4  PROT 6.3*  ALBUMIN  2.5*   CBG: Recent Labs  Lab 01/12/24 2054 01/13/24 0729 01/13/24 1148 01/13/24 1715 01/13/24 2054  GLUCAP 115* 93 103* 184* 145*    Scheduled Meds:  azithromycin   500 mg Oral Q supper   benzonatate  100 mg Oral BID   buPROPion   150 mg Oral Daily   busPIRone  7.5 mg Oral QHS   dorzolamide  1 drop Both Eyes BID   DULoxetine  30 mg Oral Daily   enoxaparin  (LOVENOX ) injection  40 mg Subcutaneous Q24H   irbesartan  150 mg Oral Daily   And   hydrochlorothiazide   25 mg Oral Daily   ipratropium-albuterol   3 mL Nebulization Q6H   methylPREDNISolone  (SOLU-MEDROL ) injection  80 mg Intravenous Daily   Continuous Infusions:  cefTRIAXone  (ROCEPHIN )  IV 2 g (01/13/24 1818)   PRN Meds:.guaiFENesin-dextromethorphan, mouth rinse  Family Communication: none at bedside  Disposition: Status is: Inpatient Remains inpatient appropriate because: copd exacerbation     Time spent: 36 minutes  Length of inpatient stay: 3 days  Author: Carliss LELON Canales, DO 01/14/2024 12:03 PM  For on call review [email or web address].

## 2024-01-14 NOTE — Plan of Care (Signed)
   Problem: Coping: Goal: Ability to adjust to condition or change in health will improve Outcome: Progressing

## 2024-01-15 ENCOUNTER — Other Ambulatory Visit (HOSPITAL_COMMUNITY): Payer: Self-pay

## 2024-01-15 DIAGNOSIS — J9621 Acute and chronic respiratory failure with hypoxia: Secondary | ICD-10-CM | POA: Diagnosis not present

## 2024-01-15 DIAGNOSIS — J449 Chronic obstructive pulmonary disease, unspecified: Secondary | ICD-10-CM | POA: Diagnosis not present

## 2024-01-15 DIAGNOSIS — J441 Chronic obstructive pulmonary disease with (acute) exacerbation: Secondary | ICD-10-CM

## 2024-01-15 DIAGNOSIS — J189 Pneumonia, unspecified organism: Secondary | ICD-10-CM | POA: Diagnosis not present

## 2024-01-15 LAB — LEGIONELLA PNEUMOPHILA SEROGP 1 UR AG: L. pneumophila Serogp 1 Ur Ag: NEGATIVE

## 2024-01-15 MED ORDER — ALBUTEROL SULFATE HFA 108 (90 BASE) MCG/ACT IN AERS
2.0000 | INHALATION_SPRAY | Freq: Four times a day (QID) | RESPIRATORY_TRACT | 2 refills | Status: AC | PRN
  Filled 2024-01-15 – 2024-08-14 (×3): qty 6.7, 25d supply, fill #0

## 2024-01-15 MED ORDER — BENZONATATE 100 MG PO CAPS
100.0000 mg | ORAL_CAPSULE | Freq: Two times a day (BID) | ORAL | 0 refills | Status: AC
  Filled 2024-01-15: qty 20, 10d supply, fill #0

## 2024-01-15 MED ORDER — PREDNISONE 10 MG PO TABS
ORAL_TABLET | ORAL | 0 refills | Status: AC
  Filled 2024-01-15: qty 30, 12d supply, fill #0

## 2024-01-15 MED ORDER — ALBUTEROL SULFATE (2.5 MG/3ML) 0.083% IN NEBU
2.5000 mg | INHALATION_SOLUTION | Freq: Four times a day (QID) | RESPIRATORY_TRACT | 12 refills | Status: AC
  Filled 2024-01-15 – 2024-08-14 (×3): qty 90, 8d supply, fill #0

## 2024-01-15 MED ORDER — AZITHROMYCIN 500 MG PO TABS
500.0000 mg | ORAL_TABLET | Freq: Every day | ORAL | 0 refills | Status: AC
  Filled 2024-01-15: qty 3, 3d supply, fill #0

## 2024-01-15 MED ORDER — AMOXICILLIN-POT CLAVULANATE 875-125 MG PO TABS
1.0000 | ORAL_TABLET | Freq: Two times a day (BID) | ORAL | 0 refills | Status: AC
  Filled 2024-01-15: qty 6, 3d supply, fill #0

## 2024-01-15 NOTE — Discharge Summary (Signed)
 Physician Discharge Summary   Patient: Gina Petty MRN: [id number] DOB: 01/17/1946  Admit date:     01/11/2024  Discharge date: 01/15/24  Discharge Physician: Carliss LELON Canales   PCP: Teresa Channel, MD   Recommendations at discharge:    Pt to be discharged home.   If you experience worsening fever, chills, chest pain, shortness of breath, or other concerning symptoms, please call your PCP or go to the emergency department immediately.  Discharge Diagnoses: Principal Problem:   Multifocal pneumonia Active Problems:   Essential hypertension, benign   Hyperlipidemia   Acute on chronic respiratory failure with hypoxia (HCC)   Anxiety disorder   COPD, severe (HCC)   Hypokalemia  Resolved Problems:   * No resolved hospital problems. *   Hospital Course:  78 y.o. female with medical history significant of COPD, previous pulmonary embolism, essential hypertension, depression, osteoarthritis, history of previous tracheostomy and gastrostomy tube, who was at her PCPs office today with complaint of shortness of breath cough and wheezing.  This been going on gradually since last week.  Patient is not on home O2.  She was evaluated and found to have low oxygen at the PCPs office.  She was also wheezing.  Patient was given some DuoNebs, Solu-Medrol  and magnesium  IV.  EMS brought patient to the ER with shortness of breath.  In the ER patient has been evaluated.  CT shows multifocal pneumonia.  Patient being admitted for management of acute hypoxic respiratory failure secondary to multifocal pneumonia and possibly COPD exacerbation.  Patient continues to cough.  No hemoptysis.  The cough is productive of white sputum.    Assessment and Plan:   Acute hypoxic respiratory failure - Initially requiring 2-3 L nasal cannula to maintain O2 sats greater than 90%.  Throughout the course of hospital stay was able to be weaned down to room air.  Patient feeling improved, ambulatory, eager for discharge  home.  Acute COPD exacerbation - Hypoxia, wheezing, cough on presentation.  Responding well to IV Solu-Medrol , scheduled nebulizers.  Given an increased sputum production and cough, received empiric antibiotics.  Guaifenesin, Tessalon Perles added.  Will transition patient over to p.o. azithromycin  plus Augmentin to take as directed for the next 3 days.  Will send a prescription for albuterol  MDI, nebulizer with albuterol  nebs.   Multifocal pneumonia - Noted bilateral tree-in-bud opacities in CT scan.  Sputum culture, Legionella urine antigens pending.  Strep pneumo urine antigen negative.  COVID/flu/RSV negative.  Rapid pathogen profile negative.  Received empiric ceftriaxone  2 g every 24 hours plus azithromycin  500 mg every 24 hours.  Transition to p.o. antibiotics as above.   History of PE - No longer on anticoagulation.  No PE noted on CT angio.   Hypertension/hyperlipidemia - Resume home regimen.   Depression/anxiety - Resume home medication regimen   Consultants: None Procedures performed: None Disposition: Home Diet recommendation:  Cardiac diet  DISCHARGE MEDICATION: Allergies as of 01/15/2024       Reactions   Penicillins Rash   Details not specified Tolerates Ancef         Medication List     STOP taking these medications    amoxicillin 500 MG capsule Commonly known as: AMOXIL       TAKE these medications    Acetaminophen  500 MG capsule Take 1,000 mg by mouth every 6 (six) hours as needed for pain or fever.   albuterol  (2.5 MG/3ML) 0.083% nebulizer solution Commonly known as: PROVENTIL  Take 3 mLs (2.5 mg total) by  nebulization every 6 (six) hours.   albuterol  108 (90 Base) MCG/ACT inhaler Commonly known as: VENTOLIN  HFA Inhale 2 puffs into the lungs every 6 (six) hours as needed for wheezing or shortness of breath.   amoxicillin-clavulanate 875-125 MG tablet Commonly known as: AUGMENTIN Take 1 tablet by mouth 2 (two) times daily.   azithromycin   500 MG tablet Commonly known as: ZITHROMAX  Take 1 tablet (500 mg total) by mouth daily with supper.   benzonatate 100 MG capsule Commonly known as: TESSALON Take 1 capsule (100 mg total) by mouth 2 (two) times daily.   buPROPion  150 MG 24 hr tablet Commonly known as: WELLBUTRIN  XL Take 150 mg by mouth daily.   busPIRone 7.5 MG tablet Commonly known as: BUSPAR Take 7.5 mg by mouth at bedtime.   diphenhydramine -acetaminophen  25-500 MG Tabs tablet Commonly known as: TYLENOL  PM Take 1 tablet by mouth at bedtime.   dorzolamide 2 % ophthalmic solution Commonly known as: TRUSOPT Place 1 drop into both eyes 2 (two) times daily.   DULoxetine 30 MG capsule Commonly known as: CYMBALTA Take 30 mg by mouth daily.   predniSONE 10 MG tablet Commonly known as: DELTASONE Take 4 tablets (40 mg total) by mouth daily for 3 days, THEN 3 tablets (30 mg total) daily for 3 days, THEN 2 tablets (20 mg total) daily for 3 days, THEN 1 tablet (10 mg total) daily for 3 days. Start taking on: January 15, 2024   PreserVision AREDS Caps Take 1 capsule by mouth 2 (two) times daily.   valsartan-hydrochlorothiazide  160-25 MG tablet Commonly known as: DIOVAN-HCT Take 1 tablet by mouth daily.               Durable Medical Equipment  (From admission, onward)           Start     Ordered   01/15/24 0000  For home use only DME Nebulizer machine       Question Answer Comment  Patient needs a nebulizer to treat with the following condition COPD (chronic obstructive pulmonary disease) (HCC)   Length of Need 12 Months   Additional equipment included Administration kit      01/15/24 1014             Discharge Exam: Filed Weights   01/11/24 1600 01/13/24 2221  Weight: 72.6 kg 75.8 kg    GENERAL:  Alert, pleasant, no acute distress  HEENT:  EOMI CARDIOVASCULAR:  RRR, no murmurs appreciated RESPIRATORY:  Clear to auscultation, poor air movement bilaterally, intermittent dry  cough GASTROINTESTINAL:  Soft, nontender, nondistended EXTREMITIES:  No LE edema bilaterally NEURO:  No new focal deficits appreciated SKIN:  No rashes noted PSYCH:  Appropriate mood and affect     Condition at discharge: improving  The results of significant diagnostics from this hospitalization (including imaging, microbiology, ancillary and laboratory) are listed below for reference.   Imaging Studies: CT Angio Chest PE W and/or Wo Contrast Result Date: 01/11/2024 CLINICAL DATA:  Positive D-dimer and shortness of breath. EXAM: CT ANGIOGRAPHY CHEST WITH CONTRAST TECHNIQUE: Multidetector CT imaging of the chest was performed using the standard protocol during bolus administration of intravenous contrast. Multiplanar CT image reconstructions and MIPs were obtained to evaluate the vascular anatomy. RADIATION DOSE REDUCTION: This exam was performed according to the departmental dose-optimization program which includes automated exposure control, adjustment of the mA and/or kV according to patient size and/or use of iterative reconstruction technique. CONTRAST:  65mL OMNIPAQUE  IOHEXOL  350 MG/ML SOLN COMPARISON:  Chest  CT 10/11/2021. FINDINGS: Cardiovascular: Satisfactory opacification of the pulmonary arteries to the segmental level. No evidence of pulmonary embolism. Normal heart size. No pericardial effusion. There are atherosclerotic calcifications of the aorta. Mediastinum/Nodes: There are enlarged subcarinal lymph nodes measuring up to 10 mm, similar to prior. There are enlarged right hilar lymph nodes measuring up to 15 mm, similar to prior. There are prominent left hilar, prevascular and AP window lymph nodes. There also prominent paratracheal lymph nodes. These have slightly increased in size and number when compared to the prior study. Visualized esophagus and thyroid  gland are within normal limits. Lungs/Pleura: Severe emphysema again seen. Previously identified areas of airspace consolidation  bilaterally have resolved. There are patchy ground-glass and tree-in-bud opacities throughout both lungs in the mid and lower lung predominance, new from prior. There is no pleural effusion or pneumothorax. Upper Abdomen: No acute abnormality. Musculoskeletal: No chest Broady abnormality. No acute or significant osseous findings. Review of the MIP images confirms the above findings. IMPRESSION: 1. No evidence for pulmonary embolism. 2. Resolution of bilateral airspace consolidation. 3. New patchy ground-glass and tree-in-bud opacities throughout both lungs in the mid and lower lung predominance, compatible with multifocal pneumonia. 4. Stable severe emphysema. 5. Increasing mediastinal lymphadenopathy. Aortic Atherosclerosis (ICD10-I70.0) and Emphysema (ICD10-J43.9). Electronically Signed   By: Greig Pique M.D.   On: 01/11/2024 19:58   DG Chest 2 View Result Date: 01/11/2024 CLINICAL DATA:  Chest pain EXAM: CHEST - 2 VIEW COMPARISON:  Chest x-ray 11/29/2021 FINDINGS: There is scattered interstitial opacities in the lung bases, possibly chronic. There is no pleural effusion or pneumothorax. The cardiomediastinal silhouette is within normal limits. Emphysematous changes again noted. No acute osseous abnormality. IMPRESSION: 1. Scattered interstitial opacities in the lung bases, possibly chronic. 2. Emphysematous changes. Electronically Signed   By: Greig Pique M.D.   On: 01/11/2024 16:57    Microbiology: Results for orders placed or performed during the hospital encounter of 01/11/24  Culture, blood (routine x 2)     Status: None (Preliminary result)   Collection Time: 01/11/24  8:29 PM   Specimen: BLOOD  Result Value Ref Range Status   Specimen Description BLOOD SITE NOT SPECIFIED  Final   Special Requests   Final    BOTTLES DRAWN AEROBIC AND ANAEROBIC Blood Culture results may not be optimal due to an inadequate volume of blood received in culture bottles   Culture   Final    NO GROWTH 4  DAYS Performed at [facility], [address], [place], [place] [address]    Report Status PENDING  Incomplete  Culture, blood (routine x 2)     Status: None (Preliminary result)   Collection Time: 01/11/24  8:34 PM   Specimen: BLOOD  Result Value Ref Range Status   Specimen Description BLOOD SITE NOT SPECIFIED  Final   Special Requests   Final    BOTTLES DRAWN AEROBIC AND ANAEROBIC Blood Culture results may not be optimal due to an inadequate volume of blood received in culture bottles   Culture   Final    NO GROWTH 4 DAYS Performed at [facility], [address], [place], [place] [address]    Report Status PENDING  Incomplete  Resp panel by RT-PCR (RSV, Flu A&B, Covid) Anterior Nasal Swab     Status: None   Collection Time: 01/12/24 11:44 AM   Specimen: Anterior Nasal Swab  Result Value Ref Range Status   SARS Coronavirus 2 by RT PCR NEGATIVE NEGATIVE Final  Influenza A by PCR NEGATIVE NEGATIVE Final   Influenza B by PCR NEGATIVE NEGATIVE Final    Comment: (NOTE) The Xpert Xpress SARS-CoV-2/FLU/RSV plus assay is intended as an aid in the diagnosis of influenza from Nasopharyngeal swab specimens and should not be used as a sole basis for treatment. Nasal washings and aspirates are unacceptable for Xpert Xpress SARS-CoV-2/FLU/RSV testing.  Fact Sheet for Patients: BloggerCourse.com  Fact Sheet for Healthcare Providers: SeriousBroker.it  This test is not yet approved or cleared by the [place]  FDA and has been authorized for detection and/or diagnosis of SARS-CoV-2 by FDA under an Emergency Use Authorization (EUA). This EUA will remain in effect (meaning this test can be used) for the duration of the COVID-19 declaration under Section 564(b)(1) of the Act, 21 U.S.C. section 360bbb-3(b)(1), unless the authorization is terminated or revoked.     Resp Syncytial Virus by PCR NEGATIVE NEGATIVE Final     Comment: (NOTE) Fact Sheet for Patients: BloggerCourse.com  Fact Sheet for Healthcare Providers: SeriousBroker.it  This test is not yet approved or cleared by the [place]  FDA and has been authorized for detection and/or diagnosis of SARS-CoV-2 by FDA under an Emergency Use Authorization (EUA). This EUA will remain in effect (meaning this test can be used) for the duration of the COVID-19 declaration under Section 564(b)(1) of the Act, 21 U.S.C. section 360bbb-3(b)(1), unless the authorization is terminated or revoked.  Performed at [facility], [address] [place], [place] [address]   Respiratory (~20 pathogens) panel by PCR     Status: None   Collection Time: 01/12/24 11:44 AM   Specimen: Anterior Nasal Swab; Respiratory  Result Value Ref Range Status   Adenovirus NOT DETECTED NOT DETECTED Final   Coronavirus 229E NOT DETECTED NOT DETECTED Final    Comment: (NOTE) The Coronavirus on the Respiratory Panel, DOES NOT test for the novel  Coronavirus (2019 nCoV)    Coronavirus HKU1 NOT DETECTED NOT DETECTED Final   Coronavirus NL63 NOT DETECTED NOT DETECTED Final   Coronavirus OC43 NOT DETECTED NOT DETECTED Final   Metapneumovirus NOT DETECTED NOT DETECTED Final   Rhinovirus / Enterovirus NOT DETECTED NOT DETECTED Final   Influenza A NOT DETECTED NOT DETECTED Final   Influenza B NOT DETECTED NOT DETECTED Final   Parainfluenza Virus 1 NOT DETECTED NOT DETECTED Final   Parainfluenza Virus 2 NOT DETECTED NOT DETECTED Final   Parainfluenza Virus 3 NOT DETECTED NOT DETECTED Final   Parainfluenza Virus 4 NOT DETECTED NOT DETECTED Final   Respiratory Syncytial Virus NOT DETECTED NOT DETECTED Final   Bordetella pertussis NOT DETECTED NOT DETECTED Final   Bordetella Parapertussis NOT DETECTED NOT DETECTED Final   Chlamydophila pneumoniae NOT DETECTED NOT DETECTED Final   Mycoplasma pneumoniae NOT DETECTED NOT DETECTED  Final    Comment: Performed at [facility], [address] [place], [place] [address]    Labs: CBC: Recent Labs  Lab 01/11/24 1617 01/11/24 1649 01/12/24 0629 01/13/24 0717 01/14/24 0823  WBC 10.8*  --  8.6 13.4* 8.0  HGB 13.5 13.3 11.8* 12.5 11.2*  HCT 41.1 39.0 36.5 39.2 35.6*  MCV 96.5  --  97.3 99.2 98.9  PLT 217  --  188 247 248   Basic Metabolic Panel: Recent Labs  Lab 01/11/24 1617 01/11/24 1649 01/12/24 0629 01/13/24 0717 01/14/24 0823  NA 135 132* 136 141 144  K 3.4* 3.5 3.1* 3.4* 4.2  CL 94*  --  99 106 105  CO2  23  --  24 24 27   GLUCOSE 136*  --  150* 86 135*  BUN 16  --  16 22 18   CREATININE 1.03*  --  0.92 0.95 0.95  CALCIUM 8.8*  --  8.8* 9.0 8.9  MG  --   --   --   --  2.2   Liver Function Tests: Recent Labs  Lab 01/12/24 0629  AST 32  ALT 29  ALKPHOS 83  BILITOT 0.4  PROT 6.3*  ALBUMIN  2.5*   CBG: Recent Labs  Lab 01/12/24 2054 01/13/24 0729 01/13/24 1148 01/13/24 1715 01/13/24 2054  GLUCAP 115* 93 103* 184* 145*    Discharge time spent: 26 minutes.  Length of inpatient stay: 4 days  Signed: Carliss LELON Canales, DO [facility] 01/15/2024

## 2024-01-15 NOTE — Plan of Care (Signed)

## 2024-01-15 NOTE — Progress Notes (Signed)
 DISCHARGE NOTE HOME Gina Petty to be discharged Home per MD order. Discussed prescriptions and follow up appointments with the patient. Prescriptions given to patient; medication list explained in detail. Patient verbalized understanding.  Skin clean, dry and intact without evidence of skin break down, no evidence of skin tears noted. IV catheter discontinued intact. Site without signs and symptoms of complications. Dressing and pressure applied. Pt denies pain at the site currently. No complaints noted.  Patient free of lines, drains, and wounds.   An After Visit Summary (AVS) was printed and given to the patient. Patient escorted via wheelchair to d/c lounge and she will await meds from [facility] pharmacy and nebulizer.   Keiston Manley A Proctor-Gann, RN

## 2024-01-15 NOTE — Care Management Important Message (Signed)
 Important Message  Patient Details  Name: Gina Petty MRN: [id number] Date of Birth: [birth year]   Important Message Given:  Yes - Medicare IM     [name] 01/15/2024, 3:22 PM

## 2024-01-15 NOTE — TOC Transition Note (Signed)
 Transition of Care [facility]) - Discharge Note   Patient Details  Name: Gina Petty MRN: [id number] Date of Birth: 02-05-1946  Transition of Care [facility]) CM/SW Contact:  Tom-Johnson, Harvest Muskrat, RN Phone Number: 01/15/2024, 12:42 PM   Clinical Narrative:     Patient is scheduled for discharge today.  Readmission Risk Assessment done. Hospital f/u and discharge instructions on AVS. Prescriptions sent to [facility] pharmacy and patient will receive meds prior discharge. Nebulizer machine ordered from [employer] and [employer] to deliver to patient at the discharge lounge. Son, Zack to transport at discharge.  No further TOC needs noted.       Final next level of care: Home/Self Care Barriers to Discharge: Barriers Resolved   Patient Goals and CMS Choice Patient states their goals for this hospitalization and ongoing recovery are:: To return home CMS Medicare.gov Compare Post Acute Care list provided to:: Patient Choice offered to / list presented to : NA      Discharge Placement                Patient to be transferred to facility by: Son Name of family member notified: Zack    Discharge Plan and Services Additional resources added to the After Visit Summary for                  DME Arranged: N/A DME Agency: NA       HH Arranged: NA HH Agency: NA        Social Drivers of Health (SDOH) Interventions SDOH Screenings   Food Insecurity: No Food Insecurity (01/12/2024)  Housing: High Risk (01/12/2024)  Transportation Needs: No Transportation Needs (01/12/2024)  Utilities: Not At Risk (01/12/2024)  Social Connections: Moderately Integrated (01/12/2024)  Tobacco Use: Medium Risk (01/11/2024)     Readmission Risk Interventions    01/15/2024   12:40 PM  Readmission Risk Prevention Plan  Post Dischage Appt Complete  Medication Screening Complete  Transportation Screening Complete

## 2024-01-16 LAB — CULTURE, BLOOD (ROUTINE X 2)
Culture: NO GROWTH
Culture: NO GROWTH

## 2024-01-25 DIAGNOSIS — H353122 Nonexudative age-related macular degeneration, left eye, intermediate dry stage: Secondary | ICD-10-CM | POA: Diagnosis not present

## 2024-01-25 DIAGNOSIS — H35351 Cystoid macular degeneration, right eye: Secondary | ICD-10-CM | POA: Diagnosis not present

## 2024-01-25 DIAGNOSIS — H353211 Exudative age-related macular degeneration, right eye, with active choroidal neovascularization: Secondary | ICD-10-CM | POA: Diagnosis not present

## 2024-01-25 DIAGNOSIS — H35371 Puckering of macula, right eye: Secondary | ICD-10-CM | POA: Diagnosis not present

## 2024-01-26 DIAGNOSIS — J449 Chronic obstructive pulmonary disease, unspecified: Secondary | ICD-10-CM | POA: Diagnosis not present

## 2024-01-26 DIAGNOSIS — I10 Essential (primary) hypertension: Secondary | ICD-10-CM | POA: Diagnosis not present

## 2024-01-26 DIAGNOSIS — Z8701 Personal history of pneumonia (recurrent): Secondary | ICD-10-CM | POA: Diagnosis not present

## 2024-02-02 DIAGNOSIS — N289 Disorder of kidney and ureter, unspecified: Secondary | ICD-10-CM | POA: Diagnosis not present

## 2024-02-20 DIAGNOSIS — H401131 Primary open-angle glaucoma, bilateral, mild stage: Secondary | ICD-10-CM | POA: Diagnosis not present

## 2024-03-22 DIAGNOSIS — L03115 Cellulitis of right lower limb: Secondary | ICD-10-CM | POA: Diagnosis not present

## 2024-04-04 DIAGNOSIS — H35371 Puckering of macula, right eye: Secondary | ICD-10-CM | POA: Diagnosis not present

## 2024-04-04 DIAGNOSIS — H35351 Cystoid macular degeneration, right eye: Secondary | ICD-10-CM | POA: Diagnosis not present

## 2024-04-04 DIAGNOSIS — H353211 Exudative age-related macular degeneration, right eye, with active choroidal neovascularization: Secondary | ICD-10-CM | POA: Diagnosis not present

## 2024-04-04 DIAGNOSIS — H353122 Nonexudative age-related macular degeneration, left eye, intermediate dry stage: Secondary | ICD-10-CM | POA: Diagnosis not present

## 2024-04-16 ENCOUNTER — Emergency Department (HOSPITAL_COMMUNITY)
Admission: EM | Admit: 2024-04-16 | Discharge: 2024-04-16 | Disposition: A | Discharge status: Home / Self Care | Attending: Emergency Medicine | Admitting: Emergency Medicine

## 2024-04-16 ENCOUNTER — Encounter (HOSPITAL_COMMUNITY): Payer: Self-pay | Admitting: Radiology

## 2024-04-16 ENCOUNTER — Other Ambulatory Visit: Payer: Self-pay

## 2024-04-16 ENCOUNTER — Emergency Department (HOSPITAL_COMMUNITY)

## 2024-04-16 DIAGNOSIS — S42332A Displaced oblique fracture of shaft of humerus, left arm, initial encounter for closed fracture: Secondary | ICD-10-CM | POA: Diagnosis not present

## 2024-04-16 DIAGNOSIS — Z79899 Other long term (current) drug therapy: Secondary | ICD-10-CM | POA: Diagnosis not present

## 2024-04-16 DIAGNOSIS — W01198A Fall on same level from slipping, tripping and stumbling with subsequent striking against other object, initial encounter: Secondary | ICD-10-CM | POA: Insufficient documentation

## 2024-04-16 DIAGNOSIS — S0181XA Laceration without foreign body of other part of head, initial encounter: Secondary | ICD-10-CM | POA: Diagnosis not present

## 2024-04-16 DIAGNOSIS — Y9222 Religious institution as the place of occurrence of the external cause: Secondary | ICD-10-CM | POA: Insufficient documentation

## 2024-04-16 DIAGNOSIS — W19XXXA Unspecified fall, initial encounter: Secondary | ICD-10-CM

## 2024-04-16 DIAGNOSIS — S0101XA Laceration without foreign body of scalp, initial encounter: Secondary | ICD-10-CM | POA: Insufficient documentation

## 2024-04-16 DIAGNOSIS — S0990XA Unspecified injury of head, initial encounter: Secondary | ICD-10-CM | POA: Diagnosis present

## 2024-04-16 DIAGNOSIS — I10 Essential (primary) hypertension: Secondary | ICD-10-CM | POA: Diagnosis not present

## 2024-04-16 DIAGNOSIS — J449 Chronic obstructive pulmonary disease, unspecified: Secondary | ICD-10-CM | POA: Insufficient documentation

## 2024-04-16 DIAGNOSIS — Z96641 Presence of right artificial hip joint: Secondary | ICD-10-CM | POA: Insufficient documentation

## 2024-04-16 DIAGNOSIS — S4990XA Unspecified injury of shoulder and upper arm, unspecified arm, initial encounter: Secondary | ICD-10-CM | POA: Diagnosis not present

## 2024-04-16 DIAGNOSIS — J439 Emphysema, unspecified: Secondary | ICD-10-CM | POA: Diagnosis not present

## 2024-04-16 DIAGNOSIS — M4312 Spondylolisthesis, cervical region: Secondary | ICD-10-CM | POA: Diagnosis not present

## 2024-04-16 DIAGNOSIS — S42342A Displaced spiral fracture of shaft of humerus, left arm, initial encounter for closed fracture: Secondary | ICD-10-CM | POA: Insufficient documentation

## 2024-04-16 DIAGNOSIS — I672 Cerebral atherosclerosis: Secondary | ICD-10-CM | POA: Diagnosis not present

## 2024-04-16 DIAGNOSIS — M5030 Other cervical disc degeneration, unspecified cervical region: Secondary | ICD-10-CM | POA: Diagnosis not present

## 2024-04-16 LAB — BASIC METABOLIC PANEL WITH GFR
Anion gap: 12 (ref 5–15)
BUN: 18 mg/dL (ref 8–23)
CO2: 26 mmol/L (ref 22–32)
Calcium: 9 mg/dL (ref 8.9–10.3)
Chloride: 104 mmol/L (ref 98–111)
Creatinine, Ser: 0.87 mg/dL (ref 0.44–1.00)
GFR, Estimated: >60 mL/min (ref >60)
Glucose, Bld: 106 mg/dL — ABNORMAL HIGH (ref 70–99)
Potassium: 3.3 mmol/L — ABNORMAL LOW (ref 3.5–5.1)
Sodium: 141 mmol/L (ref 135–145)

## 2024-04-16 LAB — CBC WITH DIFFERENTIAL/PLATELET
Abs Immature Granulocytes: 0.05 K/uL (ref 0.00–0.07)
Basophils Absolute: 0.1 K/uL (ref 0.0–0.1)
Basophils Relative: 1 %
Eosinophils Absolute: 0.3 K/uL (ref 0.0–0.5)
Eosinophils Relative: 3 %
HCT: 42.3 % (ref 36.0–46.0)
Hemoglobin: 13.7 g/dL (ref 12.0–15.0)
Immature Granulocytes: 1 %
Lymphocytes Relative: 11 %
Lymphs Abs: 1.2 K/uL (ref 0.7–4.0)
MCH: 32 pg (ref 26.0–34.0)
MCHC: 32.4 g/dL (ref 30.0–36.0)
MCV: 98.8 fL (ref 80.0–100.0)
Monocytes Absolute: 0.7 K/uL (ref 0.1–1.0)
Monocytes Relative: 7 %
Neutro Abs: 8.1 K/uL — ABNORMAL HIGH (ref 1.7–7.7)
Neutrophils Relative %: 77 %
Platelets: 187 K/uL (ref 150–400)
RBC: 4.28 MIL/uL (ref 3.87–5.11)
RDW: 12.4 % (ref 11.5–15.5)
WBC: 10.3 K/uL (ref 4.0–10.5)
nRBC: 0 % (ref 0.0–0.2)

## 2024-04-16 MED ORDER — LIDOCAINE-EPINEPHRINE (PF) 2 %-1:200000 IJ SOLN
10.0000 mL | Freq: Once | INTRAMUSCULAR | Status: AC
  Administered 2024-04-16: 10 mL
  Filled 2024-04-16: qty 20

## 2024-04-16 MED ORDER — METHOCARBAMOL 500 MG PO TABS
1000.0000 mg | ORAL_TABLET | Freq: Once | ORAL | Status: AC
  Administered 2024-04-16: 1000 mg via ORAL
  Filled 2024-04-16: qty 2

## 2024-04-16 MED ORDER — OXYCODONE-ACETAMINOPHEN 5-325 MG PO TABS
1.0000 | ORAL_TABLET | Freq: Four times a day (QID) | ORAL | Status: DC | PRN
  Administered 2024-04-16: 1 via ORAL
  Filled 2024-04-16: qty 1

## 2024-04-16 MED ORDER — OXYCODONE HCL 5 MG PO TABS: 5.0000 mg | ORAL_TABLET | Freq: Four times a day (QID) | ORAL | 0 refills | Status: AC | PRN

## 2024-04-16 NOTE — ED Provider Notes (Addendum)
 [place] EMERGENCY DEPARTMENT AT [facility] Provider Note   CSN: [id number] Arrival date & time: 04/16/24  1637     Patient presents with: Gina Petty is a 78 y.o. female with past medical history of osteoarthritis, hypertension, hyperlipidemia, severe COPD is presenting to emergency room with complaint of fall.  Patient thinks that her purse was stuck on the door causing her to fall.  She notes she fell onto her left side.  She did also strike the anterior aspect of her head against the ground.  She is not on blood thinners.  She had no loss of consciousness or confusion since the fall.  She is primarily complaining of left arm pain.  She denies any chest pain, abdominal pain, headache or neck pain. No blood thinners. Has updated tdap.     Fall       Prior to Admission medications   Medication Sig Start Date End Date Taking? Authorizing Provider  Acetaminophen  500 MG capsule Take 1,000 mg by mouth every 6 (six) hours as needed for pain or fever.    [provider]  albuterol  (PROVENTIL ) (2.5 MG/3ML) 0.083% nebulizer solution Take 3 mLs (2.5 mg total) by nebulization every 6 (six) hours. 01/15/24   Arlon Carliss ORN, DO  albuterol  (VENTOLIN  HFA) 108 (90 Base) MCG/ACT inhaler Inhale 2 puffs into the lungs every 6 (six) hours as needed for wheezing or shortness of breath. 01/15/24   Arlon Carliss ORN, DO  amoxicillin -clavulanate (AUGMENTIN ) 875-125 MG tablet Take 1 tablet by mouth 2 (two) times daily. 01/15/24   Arlon Carliss ORN, DO  azithromycin  (ZITHROMAX ) 500 MG tablet Take 1 tablet (500 mg total) by mouth daily with supper. 01/15/24   Arlon Carliss ORN, DO  benzonatate  (TESSALON ) 100 MG capsule Take 1 capsule (100 mg total) by mouth 2 (two) times daily. 01/15/24   Arlon Carliss ORN, DO  buPROPion  (WELLBUTRIN  XL) 150 MG 24 hr tablet Take 150 mg by mouth daily.    [provider]  busPIRone  (BUSPAR ) 7.5 MG tablet Take 7.5 mg by mouth at bedtime. 12/18/23    [provider]  diphenhydramine -acetaminophen  (TYLENOL  PM) 25-500 MG TABS tablet Take 1 tablet by mouth at bedtime.    [provider]  dorzolamide  (TRUSOPT ) 2 % ophthalmic solution Place 1 drop into both eyes 2 (two) times daily. 11/21/23   [provider]  DULoxetine  (CYMBALTA ) 30 MG capsule Take 30 mg by mouth daily. 12/23/23   [provider]  Multiple Vitamins-Minerals (PRESERVISION AREDS) CAPS Take 1 capsule by mouth 2 (two) times daily.    [provider]  valsartan -hydrochlorothiazide  (DIOVAN -HCT) 160-25 MG tablet Take 1 tablet by mouth daily. 12/18/23   [provider]    Allergies: Penicillins    Review of Systems  Musculoskeletal:  Positive for arthralgias.    Updated Vital Signs BP (!) 152/95   Pulse 86   Temp 97.9 F (36.6 C) (Oral)   Resp 16   SpO2 93%   Physical Exam Vitals and nursing note reviewed.  Constitutional:      General: She is not in acute distress.    Appearance: She is not toxic-appearing.  HENT:     Head: Normocephalic and atraumatic.  Eyes:     General: No scleral icterus.    Conjunctiva/sclera: Conjunctivae normal.  Cardiovascular:     Rate and Rhythm: Normal rate and regular rhythm.     Pulses: Normal pulses.     Heart sounds: Normal heart  sounds.  Pulmonary:     Effort: Pulmonary effort is normal. No respiratory distress.     Breath sounds: Normal breath sounds.  Abdominal:     General: Abdomen is flat. Bowel sounds are normal.     Palpations: Abdomen is soft.     Tenderness: There is no abdominal tenderness.  Musculoskeletal:     Comments: Left upper extremity swelling without any open fracture.  Radial pulse is strong.  Good cap refill.  Sensation intact.  Compartments soft.  Skin:    General: Skin is warm and dry.     Findings: No lesion.     Comments: Patient has approximately 1.5 cm abrasion to forehead.  Neurological:     General: No focal deficit present.     Mental Status: She  is alert and oriented to person, place, and time. Mental status is at baseline.     (all labs ordered are listed, but only abnormal results are displayed) Labs Reviewed  BASIC METABOLIC PANEL WITH GFR - Abnormal; Notable for the following components:      Result Value   Potassium 3.3 (*)    Glucose, Bld 106 (*)    All other components within normal limits  CBC WITH DIFFERENTIAL/PLATELET - Abnormal; Notable for the following components:   Neutro Abs 8.1 (*)    All other components within normal limits    EKG: None  Radiology: DG Shoulder Left Result Date: 04/16/2024 CLINICAL DATA:  Pain after fall. EXAM: LEFT SHOULDER - 2+ VIEW; LEFT HUMERUS - 2+ VIEW COMPARISON:  None Available. FINDINGS: Humerus: Minimally displaced oblique/spiral fracture of the mid proximal humeral shaft. Nondisplaced component extends superiorly to the lateral humeral head. There is no elbow dislocation. Shoulder: Proximal humeral fracture extending to the lateral humeral head. No intra-articular involvement. No shoulder dislocation. Mild underlying osteoarthritis. Questionable faint soft tissue calcifications in the region of the rotator cuff insertion. IMPRESSION: Minimally displaced oblique/spiral fracture of the mid proximal humeral shaft. Nondisplaced component extends superiorly to the lateral humeral head. Electronically Signed   By: Andrea Gasman M.D.   On: 04/16/2024 17:56   DG Humerus Left Result Date: 04/16/2024 CLINICAL DATA:  Pain after fall. EXAM: LEFT SHOULDER - 2+ VIEW; LEFT HUMERUS - 2+ VIEW COMPARISON:  None Available. FINDINGS: Humerus: Minimally displaced oblique/spiral fracture of the mid proximal humeral shaft. Nondisplaced component extends superiorly to the lateral humeral head. There is no elbow dislocation. Shoulder: Proximal humeral fracture extending to the lateral humeral head. No intra-articular involvement. No shoulder dislocation. Mild underlying osteoarthritis. Questionable faint soft  tissue calcifications in the region of the rotator cuff insertion. IMPRESSION: Minimally displaced oblique/spiral fracture of the mid proximal humeral shaft. Nondisplaced component extends superiorly to the lateral humeral head. Electronically Signed   By: Andrea Gasman M.D.   On: 04/16/2024 17:56   CT Cervical Spine Wo Contrast Result Date: 04/16/2024 CLINICAL DATA:  Fall at [place] with laceration to forehead. EXAM: CT CERVICAL SPINE WITHOUT CONTRAST TECHNIQUE: Multidetector CT imaging of the cervical spine was performed without intravenous contrast. Multiplanar CT image reconstructions were also generated. RADIATION DOSE REDUCTION: This exam was performed according to the departmental dose-optimization program which includes automated exposure control, adjustment of the mA and/or kV according to patient size and/or use of iterative reconstruction technique. COMPARISON:  None available. FINDINGS: Alignment: No traumatic subluxation. Degenerative grade 1 anterolisthesis of C4 on C5 and C7 on T1. Skull base and vertebrae: No acute fracture. Vertebral body heights are maintained. The dens and skull base  are intact. Soft tissues and spinal canal: No prevertebral fluid or swelling. No visible canal hematoma. Disc levels: Diffuse degenerative disc disease and facet hypertrophy. No high-grade canal stenosis. Upper chest: Apical emphysema. Other: None. IMPRESSION: 1. No acute fracture or traumatic subluxation of the cervical spine. 2. Multilevel degenerative disc disease and facet hypertrophy. Electronically Signed   By: Andrea Gasman M.D.   On: 04/16/2024 17:39   CT Head Wo Contrast Result Date: 04/16/2024 CLINICAL DATA:  Fall at [place].  Laceration to forehead. EXAM: CT HEAD WITHOUT CONTRAST TECHNIQUE: Contiguous axial images were obtained from the base of the skull through the vertex without intravenous contrast. RADIATION DOSE REDUCTION: This exam was performed according to the departmental  dose-optimization program which includes automated exposure control, adjustment of the mA and/or kV according to patient size and/or use of iterative reconstruction technique. COMPARISON:  Brain MRI 10/20/2021 FINDINGS: Brain: No intracranial hemorrhage, mass effect, or midline shift. No hydrocephalus. The basilar cisterns are patent. No evidence of territorial infarct or acute ischemia. Chronically enlarged perivascular space in the left lentiform nucleus. No extra-axial or intracranial fluid collection. Vascular: Atherosclerosis of skullbase vasculature without hyperdense vessel or abnormal calcification. Skull: No fracture or focal lesion. Sinuses/Orbits: No acute fracture. Small amount of fluid in the right side of sphenoid sinus and posterior right ethmoid air cells. Other: Small frontal scalp hematoma and laceration. IMPRESSION: Small frontal scalp hematoma and laceration. No acute intracranial abnormality. No skull fracture. Electronically Signed   By: Andrea Gasman M.D.   On: 04/16/2024 17:36     .Laceration Repair  Date/Time: 04/16/2024 8:06 PM  Performed by: Shermon Warren SAILOR, PA-C Authorized by: Shermon Warren SAILOR, PA-C   Consent:    Consent obtained:  Verbal   Consent given by:  Patient   Risks, benefits, and alternatives were discussed: yes     Risks discussed:  Infection, need for additional repair, nerve damage, poor wound healing, poor cosmetic result, pain, retained foreign body, tendon damage and vascular damage   Alternatives discussed:  Referral, observation, delayed treatment and no treatment Universal protocol:    Procedure explained and questions answered to patient or proxy's satisfaction: yes     Relevant documents present and verified: yes     Test results available: yes     Imaging studies available: yes     Required blood products, implants, devices, and special equipment available: yes     Site/side marked: yes     Immediately prior to procedure, a time out was  called: yes     Patient identity confirmed:  Verbally with patient Anesthesia:    Anesthesia method:  Local infiltration   Local anesthetic:  Lidocaine  2% WITH epi Laceration details:    Location:  Scalp   Scalp location:  Frontal   Length (cm):  1.5 Pre-procedure details:    Preparation:  Patient was prepped and draped in usual sterile fashion and imaging obtained to evaluate for foreign bodies Treatment:    Area cleansed with:  Povidone-iodine    Amount of cleaning:  Standard   Irrigation solution:  Sterile saline   Irrigation method:  Pressure wash   Debridement:  None Skin repair:    Repair method:  Sutures   Suture size:  5-0   Suture material:  Nylon   Suture technique:  Simple interrupted   Number of sutures:  3 Approximation:    Approximation:  Close Repair type:    Repair type:  Simple Post-procedure details:    Dressing:  Open (  no dressing)   Procedure completion:  Tolerated    Medications Ordered in the ED  oxyCODONE -acetaminophen  (PERCOCET/ROXICET) 5-325 MG per tablet 1 tablet (1 tablet Oral Given 04/16/24 1832)  lidocaine -EPINEPHrine  (XYLOCAINE  W/EPI) 2 %-1:200000 (PF) injection 10 mL (has no administration in time range)    Clinical Course as of 04/16/24 2002  Tue Apr 16, 2024  1820 Dr Sharolyn Ortho recommends sling. Coap splint is an additional optional if pain is not well controlled. Follow up in office.  [JB]    Clinical Course User Index [JB] Anel Purohit, Warren SAILOR, PA-C                                 Medical Decision Making Amount and/or Complexity of Data Reviewed Labs: ordered. Radiology: ordered.  Risk Prescription drug management.   This patient presents to the ED for concern of fall, this involves an extensive number of treatment options, and is a complaint that carries with it a high risk of complications and morbidity.  The differential diagnosis includes fracture, intracranial hemorrhage, compartment syndrome, dehydration, electrolyte  abnormality, seizure, syncope   Co morbidities that complicate the patient evaluation  Status post right hip replacement, hypertension, hyperlipidemia, COPD   Lab Tests:  I personally interpreted labs.  The pertinent results include:   CBC with no leukocytosis, no anemia BMP overall unremarkable   Imaging Studies ordered:  I ordered imaging studies including CT scan of head, CT scan of cervical spine, x-ray of left shoulder and humerus I independently visualized and interpreted imaging which showed no acute intracranial abnormality.  Does show anterior forehead laceration.  Does show left spiral fracture of the humerus. I agree with the radiologist interpretation   Cardiac Monitoring: / EKG:  The patient was maintained on a cardiac monitor.     Consultations Obtained:  I requested consultation with the Dr Sharolyn orthopedics,  and discussed lab and imaging findings as well as pertinent plan - they recommend: if pain is well under control, plan sling and OP ortho follow up.    Problem List / ED Course / Critical interventions / Medication management  Patient reports to ED after having a mechanical fall.  She did hit her head but did not lose consciousness.  She is not on blood thinner.  She has normal neurological exam.  Her head CT and cervical spine CT showed no acute findings.  She does have a left humerus fracture.  Her compartments are soft and she has strong radial pulse.  I did discuss this with orthopedics who recommended sling placement pain control and follow-up outpatient.  Patient also had laceration which was repaired here with no complication. I ordered medication including oxycodone . Reevaluation of the patient after these medicines showed that the patient improved I have reviewed the patients home medicines and have made adjustments as needed. Was given return precautions.  Was placed in a sling.  Hemodynamically stable.  Feel appropriate for discharge with  outpatient follow-up with orthopedics.  Patient and family expressed understanding and agreed to plan.        Final diagnoses:  Closed displaced spiral fracture of shaft of left humerus, initial encounter  Laceration of scalp without foreign body, initial encounter  Fall, initial encounter    ED Discharge Orders          Ordered    oxyCODONE  (ROXICODONE ) 5 MG immediate release tablet  Every 6 hours PRN  04/16/24 1941               Avanelle Pixley, Warren SAILOR, PA-C 04/16/24 2136    Shermon Warren SAILOR, PA-C 04/16/24 2142    Tegeler, Lonni PARAS, MD 04/16/24 [phone]

## 2024-04-16 NOTE — Progress Notes (Signed)
 Orthopedic Tech Progress Note Patient Details:  Gina Petty [birth year] [id number]  Ortho Devices Type of Ortho Device: Shoulder immobilizer Ortho Device/Splint Location: left Ortho Device/Splint Interventions: Ordered, Application, Adjustment   Post Interventions Patient Tolerated: Well Instructions Provided: Adjustment of device, Care of device  [name] 04/16/2024, 6:51 PM

## 2024-04-16 NOTE — Discharge Instructions (Addendum)
 Please call orthopedics for follow-up for your humerus fracture. - You can take 1000 mg of Tylenol  every 8 hours.  You can take naproxen every 12 hours.  Make sure you take naproxen with food.  Take oxycodone  for breakthrough pain. Please call primary care for suture removal in 7 to 10 days. Return to emergency room with new or worsening symptoms.

## 2024-04-16 NOTE — ED Triage Notes (Addendum)
 [place] from [facility] with reports of [place]. Pt reports her bag was caught in a door, which caused her to fall. Pt has lac to the forehead and left shoulder deformity. No thinners.  EMS intervention -20G R AC -100mcg fent

## 2024-04-23 DIAGNOSIS — G5632 Lesion of radial nerve, left upper limb: Secondary | ICD-10-CM | POA: Diagnosis not present

## 2024-04-23 DIAGNOSIS — S42342A Displaced spiral fracture of shaft of humerus, left arm, initial encounter for closed fracture: Secondary | ICD-10-CM | POA: Diagnosis not present

## 2024-04-23 DIAGNOSIS — M79602 Pain in left arm: Secondary | ICD-10-CM | POA: Diagnosis not present

## 2024-06-27 DIAGNOSIS — H353211 Exudative age-related macular degeneration, right eye, with active choroidal neovascularization: Secondary | ICD-10-CM | POA: Diagnosis not present

## 2024-06-27 DIAGNOSIS — H35362 Drusen (degenerative) of macula, left eye: Secondary | ICD-10-CM | POA: Diagnosis not present

## 2024-06-27 DIAGNOSIS — H35371 Puckering of macula, right eye: Secondary | ICD-10-CM | POA: Diagnosis not present

## 2024-06-27 DIAGNOSIS — H353122 Nonexudative age-related macular degeneration, left eye, intermediate dry stage: Secondary | ICD-10-CM | POA: Diagnosis not present

## 2024-06-27 DIAGNOSIS — H35351 Cystoid macular degeneration, right eye: Secondary | ICD-10-CM | POA: Diagnosis not present

## 2024-08-14 ENCOUNTER — Other Ambulatory Visit (HOSPITAL_COMMUNITY): Payer: Self-pay
